# Patient Record
Sex: Female | Born: 1980 | Race: Black or African American | Hispanic: No | Marital: Married | Smoking: Never smoker
Health system: Southern US, Community
[De-identification: ages and names within clinical notes are randomized; demographics above are authoritative.]

## PROBLEM LIST (undated history)

## (undated) ENCOUNTER — Inpatient Hospital Stay (HOSPITAL_COMMUNITY): Payer: Self-pay

## (undated) DIAGNOSIS — Z8619 Personal history of other infectious and parasitic diseases: Secondary | ICD-10-CM

## (undated) DIAGNOSIS — G4730 Sleep apnea, unspecified: Secondary | ICD-10-CM

## (undated) DIAGNOSIS — O009 Unspecified ectopic pregnancy without intrauterine pregnancy: Secondary | ICD-10-CM

## (undated) DIAGNOSIS — D649 Anemia, unspecified: Secondary | ICD-10-CM

## (undated) DIAGNOSIS — I10 Essential (primary) hypertension: Secondary | ICD-10-CM

## (undated) DIAGNOSIS — I5021 Acute systolic (congestive) heart failure: Secondary | ICD-10-CM

## (undated) HISTORY — DX: Anemia, unspecified: D64.9

## (undated) HISTORY — DX: Acute systolic (congestive) heart failure: I50.21

## (undated) HISTORY — PX: OTHER SURGICAL HISTORY: SHX169

## (undated) HISTORY — DX: Personal history of other infectious and parasitic diseases: Z86.19

---

## 2012-11-19 ENCOUNTER — Encounter (HOSPITAL_COMMUNITY): Payer: Self-pay | Admitting: Emergency Medicine

## 2012-11-19 DIAGNOSIS — R059 Cough, unspecified: Secondary | ICD-10-CM | POA: Insufficient documentation

## 2012-11-19 DIAGNOSIS — R109 Unspecified abdominal pain: Secondary | ICD-10-CM | POA: Insufficient documentation

## 2012-11-19 DIAGNOSIS — O200 Threatened abortion: Secondary | ICD-10-CM | POA: Insufficient documentation

## 2012-11-19 DIAGNOSIS — O209 Hemorrhage in early pregnancy, unspecified: Secondary | ICD-10-CM | POA: Insufficient documentation

## 2012-11-19 DIAGNOSIS — R05 Cough: Secondary | ICD-10-CM | POA: Insufficient documentation

## 2012-11-19 DIAGNOSIS — O99891 Other specified diseases and conditions complicating pregnancy: Secondary | ICD-10-CM | POA: Insufficient documentation

## 2012-11-19 DIAGNOSIS — J029 Acute pharyngitis, unspecified: Secondary | ICD-10-CM | POA: Insufficient documentation

## 2012-11-19 DIAGNOSIS — J3489 Other specified disorders of nose and nasal sinuses: Secondary | ICD-10-CM | POA: Insufficient documentation

## 2012-11-19 LAB — CBC WITH DIFFERENTIAL/PLATELET
Basophils Absolute: 0 10*3/uL (ref 0.0–0.1)
Eosinophils Absolute: 0.6 10*3/uL (ref 0.0–0.7)
Eosinophils Relative: 8 % — ABNORMAL HIGH (ref 0–5)
Lymphocytes Relative: 42 % (ref 12–46)
MCV: 85.4 fL (ref 78.0–100.0)
Neutrophils Relative %: 39 % — ABNORMAL LOW (ref 43–77)
Platelets: 275 10*3/uL (ref 150–400)
RBC: 4.1 MIL/uL (ref 3.87–5.11)
RDW: 15 % (ref 11.5–15.5)
WBC: 7.5 10*3/uL (ref 4.0–10.5)

## 2012-11-19 LAB — URINALYSIS, ROUTINE W REFLEX MICROSCOPIC
Bilirubin Urine: NEGATIVE
Glucose, UA: NEGATIVE mg/dL
Ketones, ur: NEGATIVE mg/dL
Nitrite: NEGATIVE
Specific Gravity, Urine: 1.023 (ref 1.005–1.030)
pH: 6 (ref 5.0–8.0)

## 2012-11-19 LAB — COMPREHENSIVE METABOLIC PANEL
Albumin: 3.8 g/dL (ref 3.5–5.2)
Alkaline Phosphatase: 64 U/L (ref 39–117)
BUN: 13 mg/dL (ref 6–23)
CO2: 26 mEq/L (ref 19–32)
Chloride: 101 mEq/L (ref 96–112)
Creatinine, Ser: 0.6 mg/dL (ref 0.50–1.10)
GFR calc non Af Amer: >90 mL/min (ref >90)
Glucose, Bld: 93 mg/dL (ref 70–99)
Potassium: 3.6 mEq/L (ref 3.5–5.1)
Total Bilirubin: 0.2 mg/dL — ABNORMAL LOW (ref 0.3–1.2)

## 2012-11-19 LAB — POCT PREGNANCY, URINE: Preg Test, Ur: POSITIVE — AB

## 2012-11-19 LAB — URINE MICROSCOPIC-ADD ON

## 2012-11-19 NOTE — ED Notes (Signed)
UNABLE TO GIVE URINE SPECIMEN AT TRIAGE " I JUST WENT".

## 2012-11-19 NOTE — ED Notes (Signed)
PT. REPORTS VAGINAL SPOTTING ONSET TODAY , STATES POSITIVE HOME PREGNANCY TEST 2 DAYS AGO WITH MID ABDOMINAL CRAMPING . ALSO REPORTED HEADACHE AND SORE THROAT.

## 2012-11-20 ENCOUNTER — Emergency Department (HOSPITAL_COMMUNITY)
Admission: EM | Admit: 2012-11-20 | Discharge: 2012-11-20 | Disposition: A | Discharge status: Home / Self Care | Payer: BC Managed Care – PPO | Attending: Emergency Medicine | Admitting: Emergency Medicine

## 2012-11-20 ENCOUNTER — Emergency Department (HOSPITAL_COMMUNITY): Payer: BC Managed Care – PPO

## 2012-11-20 DIAGNOSIS — O209 Hemorrhage in early pregnancy, unspecified: Secondary | ICD-10-CM

## 2012-11-20 DIAGNOSIS — O200 Threatened abortion: Secondary | ICD-10-CM

## 2012-11-20 LAB — WET PREP, GENITAL
Trich, Wet Prep: NONE SEEN
WBC, Wet Prep HPF POC: NONE SEEN

## 2012-11-20 LAB — GC/CHLAMYDIA PROBE AMP
CT Probe RNA: NEGATIVE
GC Probe RNA: NEGATIVE

## 2012-11-20 NOTE — ED Notes (Signed)
Pt presented to ED with  Vaginal bleeding.As per pt her last LMP  Was 16 of December 2013.Pt also complains of running nose and generally unwell.

## 2012-11-20 NOTE — ED Notes (Signed)
Pt discharged.Vital signs stable and GCS 15 

## 2012-11-20 NOTE — ED Provider Notes (Signed)
History     CSN: [id number]  Arrival date & time 11/19/12  2119   First MD Initiated Contact with Patient 11/20/12 0045      Chief Complaint  Patient presents with  . Vaginal Bleeding    (Consider location/radiation/quality/duration/timing/severity/associated sxs/prior treatment) HPI 32 yo female presents to the ER with complaint of 2-3 days of URI sxs with congestion, cough, sore throat.  She has been taking goody powers and dayquil.  No fevers, no chest pain.  Pt also c/o positive pregnancy test 2 days ago. LMP shows gestation of 4 weeks 1 day, approx.  Today has had lower abd cramping and spotting.  Pt is G0P1.  She does not know her blood type. Pt has f/u with ob in 2 weeks.   History reviewed. No pertinent past medical history.  Past Surgical History  Procedure Date  . Arm surgery     No family history on file.  History  Substance Use Topics  . Smoking status: Never Smoker   . Smokeless tobacco: Not on file  . Alcohol Use: Yes    OB History    Grav Para Term Preterm Abortions TAB SAB Ect Mult Living                  Review of Systems  All other systems reviewed and are negative.    Allergies  Shellfish allergy  Home Medications   Current Outpatient Rx  Name  Route  Sig  Dispense  Refill  . GOODY HEADACHE PO   Oral   Take 1 packet by mouth daily as needed. For headache         . DAYQUIL PO   Oral   Take 30 mLs by mouth once.           BP 160/90  Pulse 80  Temp 99.1 F (37.3 C) (Oral)  Resp 18  SpO2 100%  LMP 10/24/2012  Physical Exam  Nursing note and vitals reviewed. Constitutional: She is oriented to person, place, and time. She appears well-developed and well-nourished.  HENT:  Head: Normocephalic and atraumatic.  Right Ear: External ear normal.  Left Ear: External ear normal.  Mouth/Throat: Oropharynx is clear and moist.       Rhinorrhea noted  Eyes: Conjunctivae normal and EOM are normal. Pupils are equal, round, and reactive  to light.  Neck: Normal range of motion. Neck supple. No JVD present. No tracheal deviation present. No thyromegaly present.  Cardiovascular: Normal rate, regular rhythm, normal heart sounds and intact distal pulses.  Exam reveals no gallop and no friction rub.   No murmur heard. Pulmonary/Chest: Effort normal and breath sounds normal. No stridor. No respiratory distress. She has no wheezes. She has no rales. She exhibits no tenderness.  Abdominal: Soft. Bowel sounds are normal. She exhibits no distension and no mass. There is no tenderness. There is no rebound and no guarding.  Genitourinary:       External genitalia normal Vagina with blood  Cervix slightly open with mucus and tissue seen at os no lesions No cervical motion tenderness Adnexa palpated, no masses or tenderness noted Bladder palpated no tenderness Uterus palpated no masses or tenderness    Musculoskeletal: Normal range of motion. She exhibits no edema and no tenderness.  Lymphadenopathy:    She has no cervical adenopathy.  Neurological: She is alert and oriented to person, place, and time. She exhibits normal muscle tone. Coordination normal.  Skin: Skin is dry. No rash noted. No erythema. No  pallor.  Psychiatric: She has a normal mood and affect. Her behavior is normal. Judgment and thought content normal.    ED Course  Procedures (including critical care time)  Labs Reviewed  URINALYSIS, ROUTINE W REFLEX MICROSCOPIC - Abnormal; Notable for the following:    Hgb urine dipstick LARGE (*)     All other components within normal limits  COMPREHENSIVE METABOLIC PANEL - Abnormal; Notable for the following:    Total Bilirubin 0.2 (*)     All other components within normal limits  CBC WITH DIFFERENTIAL - Abnormal; Notable for the following:    Hemoglobin 11.7 (*)     HCT 35.0 (*)     Neutrophils Relative 39 (*)     Eosinophils Relative 8 (*)     All other components within normal limits  POCT PREGNANCY, URINE -  Abnormal; Notable for the following:    Preg Test, Ur POSITIVE (*)     All other components within normal limits  URINE MICROSCOPIC-ADD ON - Abnormal; Notable for the following:    Squamous Epithelial / LPF FEW (*)     All other components within normal limits  HCG, QUANTITATIVE, PREGNANCY - Abnormal; Notable for the following:    hCG, Beta Chain, Quant, S 1355 (*)     All other components within normal limits  WET PREP, GENITAL - Abnormal; Notable for the following:    Clue Cells Wet Prep HPF POC FEW (*)     All other components within normal limits  ABO/RH  GC/CHLAMYDIA PROBE AMP   US Ob Comp Less 14 Wks  11/20/2012  *RADIOLOGY REPORT*  Clinical Data: Pelvic pain and vaginal spotting.  OBSTETRIC <14 WK [place] AND TRANSVAGINAL OB US  Technique:  Both transabdominal and transvaginal ultrasound examinations were performed for complete evaluation of the gestation as well as the maternal uterus, adnexal regions, and pelvic cul-de-sac.  Transvaginal technique was performed to assess early pregnancy.  Comparison:  None.  Intrauterine gestational sac:  None seen. Yolk sac: N/A Embryo: N/A  Maternal uterus/adnexae: No subchorionic hemorrhage is seen.  The uterus measures 8.5 x 4.7 x 5.9 cm.  The endometrial echo complex is diffusely thickened, measuring up to 2.3 cm.  The ovaries are unremarkable in appearance.  The right ovary measures 4.3 x 1.8 x 2.5 cm, while the left ovary measures 3.2 x 3.1 x 2.1 cm.  No suspicious adnexal masses are seen; there is no evidence of ovarian torsion.  No definite ectopic pregnancy is identified.  Trace free fluid is noted within the pelvic cul-de-sac.  IMPRESSION: No intrauterine gestational sac seen at this time.  No definite evidence for ectopic pregnancy.  It likely remains too early to visualize an intrauterine gestational sac.  Would follow up quantitative beta HCG level, and perform follow-up pelvic ultrasound in 1-2 weeks.   Original Report Authenticated By: Tonia Ghent, M.D.    US Ob Transvaginal  11/20/2012  *RADIOLOGY REPORT*  Clinical Data: Pelvic pain and vaginal spotting.  OBSTETRIC <14 WK [place] AND TRANSVAGINAL OB US  Technique:  Both transabdominal and transvaginal ultrasound examinations were performed for complete evaluation of the gestation as well as the maternal uterus, adnexal regions, and pelvic cul-de-sac.  Transvaginal technique was performed to assess early pregnancy.  Comparison:  None.  Intrauterine gestational sac:  None seen. Yolk sac: N/A Embryo: N/A  Maternal uterus/adnexae: No subchorionic hemorrhage is seen.  The uterus measures 8.5 x 4.7 x 5.9 cm.  The endometrial echo complex is diffusely thickened,  measuring up to 2.3 cm.  The ovaries are unremarkable in appearance.  The right ovary measures 4.3 x 1.8 x 2.5 cm, while the left ovary measures 3.2 x 3.1 x 2.1 cm.  No suspicious adnexal masses are seen; there is no evidence of ovarian torsion.  No definite ectopic pregnancy is identified.  Trace free fluid is noted within the pelvic cul-de-sac.  IMPRESSION: No intrauterine gestational sac seen at this time.  No definite evidence for ectopic pregnancy.  It likely remains too early to visualize an intrauterine gestational sac.  Would follow up quantitative beta HCG level, and perform follow-up pelvic ultrasound in 1-2 weeks.   Original Report Authenticated By: Tonia Ghent, M.D.      1. Threatened abortion   2. Bleeding in early pregnancy       MDM  32 year old female with vaginal bleeding, recent positive pregnancy test. She is a G1 P0. Her blood type is O+. Patient instructed to followup in 2 days for repeat Quant at [facility] hospital, [facility]U. I counseled her that this is most likely going to go from threatened to complete AB as she has slightly opened os, and tissue seen at the os. She was given precautions for return        Olivia Mackie, MD 11/20/12 2102

## 2012-11-21 ENCOUNTER — Encounter (HOSPITAL_COMMUNITY): Payer: Self-pay | Admitting: *Deleted

## 2012-11-21 ENCOUNTER — Inpatient Hospital Stay (HOSPITAL_COMMUNITY)
Admission: AD | Admit: 2012-11-21 | Discharge: 2012-11-21 | Disposition: A | Discharge status: Home / Self Care | Payer: BC Managed Care – PPO | Source: Ambulatory Visit | Attending: Obstetrics and Gynecology | Admitting: Obstetrics and Gynecology

## 2012-11-21 DIAGNOSIS — O209 Hemorrhage in early pregnancy, unspecified: Secondary | ICD-10-CM

## 2012-11-21 DIAGNOSIS — O26859 Spotting complicating pregnancy, unspecified trimester: Secondary | ICD-10-CM | POA: Insufficient documentation

## 2012-11-21 DIAGNOSIS — R109 Unspecified abdominal pain: Secondary | ICD-10-CM | POA: Insufficient documentation

## 2012-11-21 LAB — URINALYSIS, ROUTINE W REFLEX MICROSCOPIC
Bilirubin Urine: NEGATIVE
Ketones, ur: NEGATIVE mg/dL
Nitrite: NEGATIVE
Protein, ur: NEGATIVE mg/dL
Urobilinogen, UA: 1 mg/dL (ref 0.0–1.0)

## 2012-11-21 LAB — HCG, QUANTITATIVE, PREGNANCY: hCG, Beta Chain, Quant, S: 1895 m[IU]/mL — ABNORMAL HIGH (ref <5)

## 2012-11-21 NOTE — MAU Provider Note (Signed)
Attestation of Attending Supervision of Advanced Practitioner (CNM/NP): Evaluation and management procedures were performed by the Advanced Practitioner under my supervision and collaboration.  I have reviewed the Advanced Practitioner's note and chart, and I agree with the management and plan.  Alice Hays 11/21/2012 9:24 PM

## 2012-11-21 NOTE — MAU Provider Note (Signed)
History     CSN: [id number]  Arrival date and time: 11/21/12 6295   First Provider Initiated Contact with Patient 11/21/12 0913      Chief Complaint  Patient presents with  . Vaginal Bleeding   HPI  Pt is [redacted]w[redacted]d pregnant and presents with abdominal cramping and spotting last night.  She has also has had constipation.  Her pain in her abdomen is mid lower abdomen radiating to umbilicus.  She denies nausea or vomiting, UTI symptoms.  She was seen in [facility] ED 1/23 evening and had HCG drawn and ultrasound.   Past Medical History  Diagnosis Date  . No pertinent past medical history     Past Surgical History  Procedure Date  . Arm surgery     No family history on file.  History  Substance Use Topics  . Smoking status: Never Smoker   . Smokeless tobacco: Not on file  . Alcohol Use: Yes    Allergies:  Allergies  Allergen Reactions  . Shellfish Allergy Anaphylaxis    Prescriptions prior to admission  Medication Sig Dispense Refill  . acetaminophen (TYLENOL) 325 MG tablet Take 650 mg by mouth every 4 (four) hours as needed. For pain      . Aspirin-Acetaminophen-Caffeine (GOODY HEADACHE PO) Take 1 packet by mouth daily as needed. For headache      . Pseudoephedrine-APAP-DM (DAYQUIL PO) Take 30 mLs by mouth daily as needed. For cold symptoms        Review of Systems  Constitutional: Negative for fever and chills.  Gastrointestinal: Positive for abdominal pain and constipation. Negative for nausea, vomiting and diarrhea.  Genitourinary: Negative for dysuria and urgency.   Physical Exam   Blood pressure 134/78, pulse 90, temperature 99.7 F (37.6 C), temperature source Oral, resp. rate 18, last menstrual period 10/26/2012.  Physical Exam  Vitals reviewed. Constitutional: She is oriented to person, place, and time. She appears well-developed and well-nourished.  HENT:  Head: Normocephalic.  Eyes: Pupils are equal, round, and reactive to light.  Neck: Normal range of  motion. Neck supple.  Cardiovascular: Normal rate.   Respiratory: Effort normal.  GI: Soft. She exhibits no distension. There is no tenderness. There is no rebound and no guarding.  Genitourinary:       Small amount of dark brown thick vaginal discharge in vault; cervix clean, NT; uterus NSSC NT; adnexa without palpable enlargement or tenderness  Musculoskeletal: Normal range of motion.  Neurological: She is alert and oriented to person, place, and time.  Skin: Skin is warm and dry.  Psychiatric: She has a normal mood and affect.    MAU Course  Procedures Results for orders placed during the hospital encounter of 11/21/12 (from the past 24 hour(s))  URINALYSIS, ROUTINE W REFLEX MICROSCOPIC     Status: Abnormal   Collection Time   11/21/12  8:50 AM      Component Value Range   Color, Urine YELLOW  YELLOW   APPearance CLEAR  CLEAR   Specific Gravity, Urine >1.030 (*) 1.005 - 1.030   pH 6.0  5.0 - 8.0   Glucose, UA NEGATIVE  NEGATIVE mg/dL   Hgb urine dipstick LARGE (*) NEGATIVE   Bilirubin Urine NEGATIVE  NEGATIVE   Ketones, ur NEGATIVE  NEGATIVE mg/dL   Protein, ur NEGATIVE  NEGATIVE mg/dL   Urobilinogen, UA 1.0  0.0 - 1.0 mg/dL   Nitrite NEGATIVE  NEGATIVE   Leukocytes, UA NEGATIVE  NEGATIVE  URINE MICROSCOPIC-ADD ON  Status: Abnormal   Collection Time   11/21/12  8:50 AM      Component Value Range   Squamous Epithelial / LPF MANY (*) RARE   WBC, UA 0-2  <3 WBC/hpf   RBC / HPF 7-10  <3 RBC/hpf   Urine-Other MUCOUS PRESENT    HCG, QUANTITATIVE, PREGNANCY     Status: Abnormal   Collection Time   11/21/12 10:14 AM      Component Value Range   hCG, Beta Chain, Quant, S 1895 (*) <5 mIU/mL  discussed with Dr. Jolayne Panther- will repeat HCG in 48 hours  Assessment and Plan  Bleeding in pregnancy Repeat HCG 48 hours- Monday 1/27 Ectopic precautions  Alice Hays 11/21/2012, 9:13 AM

## 2012-11-21 NOTE — MAU Note (Signed)
t seen at [facility] on 1/23-24 for vaginal bleeding. Told to come here for follow up. Told she had a threatened miscarriage. Pt reports she is still having some vaginal bleeding lighter than it was the other day and reports some cramping like a period.

## 2012-11-23 ENCOUNTER — Inpatient Hospital Stay (HOSPITAL_COMMUNITY)
Admission: AD | Admit: 2012-11-23 | Discharge: 2012-11-23 | Disposition: A | Discharge status: Home / Self Care | Payer: BC Managed Care – PPO | Source: Ambulatory Visit | Attending: Obstetrics & Gynecology | Admitting: Obstetrics & Gynecology

## 2012-11-23 ENCOUNTER — Inpatient Hospital Stay (HOSPITAL_COMMUNITY): Payer: BC Managed Care – PPO

## 2012-11-23 ENCOUNTER — Encounter (HOSPITAL_COMMUNITY): Payer: Self-pay | Admitting: *Deleted

## 2012-11-23 DIAGNOSIS — O26859 Spotting complicating pregnancy, unspecified trimester: Secondary | ICD-10-CM

## 2012-11-23 DIAGNOSIS — O00109 Unspecified tubal pregnancy without intrauterine pregnancy: Secondary | ICD-10-CM | POA: Insufficient documentation

## 2012-11-23 DIAGNOSIS — R109 Unspecified abdominal pain: Secondary | ICD-10-CM

## 2012-11-23 DIAGNOSIS — O009 Unspecified ectopic pregnancy without intrauterine pregnancy: Secondary | ICD-10-CM

## 2012-11-23 LAB — COMPREHENSIVE METABOLIC PANEL
ALT: 12 U/L (ref 0–35)
Alkaline Phosphatase: 57 U/L (ref 39–117)
CO2: 25 mEq/L (ref 19–32)
Chloride: 97 mEq/L (ref 96–112)
GFR calc Af Amer: >90 mL/min (ref >90)
Glucose, Bld: 96 mg/dL (ref 70–99)
Potassium: 3.6 mEq/L (ref 3.5–5.1)
Sodium: 134 mEq/L — ABNORMAL LOW (ref 135–145)
Total Bilirubin: 0.1 mg/dL — ABNORMAL LOW (ref 0.3–1.2)
Total Protein: 7.2 g/dL (ref 6.0–8.3)

## 2012-11-23 LAB — HCG, QUANTITATIVE, PREGNANCY: hCG, Beta Chain, Quant, S: 2782 m[IU]/mL — ABNORMAL HIGH (ref <5)

## 2012-11-23 LAB — CBC
Hemoglobin: 10.3 g/dL — ABNORMAL LOW (ref 12.0–15.0)
MCHC: 32.1 g/dL (ref 30.0–36.0)
RBC: 3.7 MIL/uL — ABNORMAL LOW (ref 3.87–5.11)
WBC: 9.3 10*3/uL (ref 4.0–10.5)

## 2012-11-23 MED ORDER — METHOTREXATE INJECTION FOR WOMEN'S HOSPITAL
50.0000 mg/m2 | Freq: Once | INTRAMUSCULAR | Status: AC
  Administered 2012-11-23: 85 mg via INTRAMUSCULAR
  Filled 2012-11-23: qty 1.7

## 2012-11-23 NOTE — MAU Provider Note (Signed)
History     CSN: [id number]  Arrival date and time: 11/23/12 4098   First Provider Initiated Contact with Patient 11/23/12 [phone]      Chief Complaint  Patient presents with  . Follow-up   HPI Ms. Alice Hays is a 32 y.o. G1P0 at [redacted]w[redacted]d who presents to MAU today for a follow-up quantitative bHCG. The patient was seen on 11/21/12 for vaginal bleeding and abdominal pain. The patient states that her bleeding has decreased to spotting today. The is no longer having abdominal pain. She states that she was given recommendations for stool softeners when she was here on 11/21/12 and that she was able to have a BM and had significant relief of abdominal pain. The patient has had mild, occasional nausea without vomiting. She denies fever.   OB History    Grav Para Term Preterm Abortions TAB SAB Ect Mult Living   1               Past Medical History  Diagnosis Date  . No pertinent past medical history     Past Surgical History  Procedure Date  . Arm surgery     Family History  Problem Relation Age of Onset  . Cancer Mother   . Heart disease Mother   . Heart disease Father   . Cancer Maternal Aunt   . Diabetes Maternal Grandmother     History  Substance Use Topics  . Smoking status: Never Smoker   . Smokeless tobacco: Not on file  . Alcohol Use: Yes    Allergies:  Allergies  Allergen Reactions  . Shellfish Allergy Anaphylaxis    No prescriptions prior to admission    ROS All negative unless otherwise noted in HPI Physical Exam   Blood pressure 121/79, pulse 90, temperature 98 F (36.7 C), temperature source Oral, resp. rate 16, height 4\' 10"  (1.473 m), weight 161 lb (73.029 kg), last menstrual period 10/26/2012, SpO2 99.00%.  Physical Exam  Constitutional: She is oriented to person, place, and time. She appears well-developed and well-nourished. No distress.  HENT:  Head: Normocephalic.  Cardiovascular: Normal rate, regular rhythm and normal heart sounds.     Respiratory: Effort normal and breath sounds normal. No respiratory distress.  GI: Soft. Bowel sounds are normal. She exhibits no distension and no mass. There is no tenderness. There is no rebound and no guarding.  Neurological: She is alert and oriented to person, place, and time.  Skin: Skin is warm and dry. No erythema.  Psychiatric: She has a normal mood and affect.   Results for orders placed during the hospital encounter of 11/23/12 (from the past 24 hour(s))  HCG, QUANTITATIVE, PREGNANCY     Status: Abnormal   Collection Time   11/23/12  8:20 AM      Component Value Range   hCG, Beta Chain, Quant, S 2782 (*) <5 mIU/mL  CBC     Status: Abnormal   Collection Time   11/23/12  8:20 AM      Component Value Range   WBC 9.3  4.0 - 10.5 K/uL   RBC 3.70 (*) 3.87 - 5.11 MIL/uL   Hemoglobin 10.3 (*) 12.0 - 15.0 g/dL   HCT 47.8 (*) 29.5 - 62.1 %   MCV 86.8  78.0 - 100.0 fL   MCH 27.8  26.0 - 34.0 pg   MCHC 32.1  30.0 - 36.0 g/dL   RDW 30.8  65.7 - 84.6 %   Platelets 247  150 - 400 K/uL  COMPREHENSIVE METABOLIC PANEL     Status: Abnormal   Collection Time   11/23/12  8:20 AM      Component Value Range   Sodium 134 (*) 135 - 145 mEq/L   Potassium 3.6  3.5 - 5.1 mEq/L   Chloride 97  96 - 112 mEq/L   CO2 25  19 - 32 mEq/L   Glucose, Bld 96  70 - 99 mg/dL   BUN 15  6 - 23 mg/dL   Creatinine, Ser 1.61  0.50 - 1.10 mg/dL   Calcium 9.5  8.4 - 09.6 mg/dL   Total Protein 7.2  6.0 - 8.3 g/dL   Albumin 3.5  3.5 - 5.2 g/dL   AST 19  0 - 37 U/L   ALT 12  0 - 35 U/L   Alkaline Phosphatase 57  39 - 117 U/L   Total Bilirubin 0.1 (*) 0.3 - 1.2 mg/dL   GFR calc non Af Amer >90  >90 mL/min   GFR calc Af Amer >90  >90 mL/min      MAU Course  Procedures None  MDM Discussed patient with Dr. Penne Lash. Last US showed no IUGS or evidence of ectopic. Quant went from 1895 to 2782 in 48 hours. She recommends [place] today.  US shows Left Ectopic pregnancy. Discussed [place] results with Dr. Penne Lash. Patient  is a good candidate for MTX.  Discussed dx of ectopic and MTX with patient and significant other. Patient agrees to MTX treatment. Information sheet with precautions given and discussed.   Assessment and Plan  A: Ectopic pregnancy  P: Discharge home Patient will follow-up on day 4 (thursday) and 7 (sunday) for quant hCG Ectopic precautions discussed. Patient encouraged to return to MAU sooner if she develops severe abdominal pain, heavy bleeding, fever, N/V Patient will follow-up in clinic in 3-4 weeks  Freddi Starr, PA-C 11/23/2012, 5:45 PM

## 2012-11-23 NOTE — MAU Provider Note (Signed)
Attestation of Attending Supervision of Advanced Practitioner (CNM/NP): Evaluation and management procedures were performed by the Advanced Practitioner under my supervision and collaboration. I have reviewed the Advanced Practitioner's note and chart, and I agree with the management and plan.  Caryl Fate H. 10:54 PM   

## 2012-11-23 NOTE — MAU Note (Signed)
Patient to MAU for repeat BHCG. Patient denies any pain but does have a little spotting on and off.

## 2012-11-23 NOTE — MAU Note (Signed)
Pt presents to MAU for a f/u on pregnancy.

## 2012-11-26 ENCOUNTER — Inpatient Hospital Stay (HOSPITAL_COMMUNITY)
Admission: AD | Admit: 2012-11-26 | Discharge: 2012-11-26 | Disposition: A | Discharge status: Home / Self Care | Payer: BC Managed Care – PPO | Source: Ambulatory Visit | Attending: Obstetrics & Gynecology | Admitting: Obstetrics & Gynecology

## 2012-11-26 DIAGNOSIS — O00109 Unspecified tubal pregnancy without intrauterine pregnancy: Secondary | ICD-10-CM

## 2012-11-26 DIAGNOSIS — O009 Unspecified ectopic pregnancy without intrauterine pregnancy: Secondary | ICD-10-CM

## 2012-11-26 LAB — HCG, QUANTITATIVE, PREGNANCY: hCG, Beta Chain, Quant, S: 6105 m[IU]/mL — ABNORMAL HIGH (ref <5)

## 2012-11-26 MED ORDER — METHOTREXATE INJECTION FOR WOMEN'S HOSPITAL
50.0000 mg/m2 | Freq: Once | INTRAMUSCULAR | Status: AC
  Administered 2012-11-26: 85 mg via INTRAMUSCULAR
  Filled 2012-11-26: qty 1.7

## 2012-11-26 NOTE — MAU Note (Signed)
Pt states here for repeat bhcg post MTX, having small amount of bleeding. No clots noted. Changing pad q4 hours for sanitary purposes, usually sees patch size of deck of cards on pad.

## 2012-11-26 NOTE — MAU Provider Note (Signed)
History     CSN: [id number]  Arrival date and time: 11/26/12 1118   First Provider Initiated Contact with Patient 11/26/12 1242      Chief Complaint  Patient presents with  . Follow-up BCHG post MTX    HPI Ms. Alice Hays is a 32 y.o. G1P0 at [redacted]w[redacted]d who presents to MAU for her follow-up hCG 4 days s/p MTX inj. The patient states that she has had some light vaginal bleeding. She is changing her pad about every 4 hours, but she is not soaking the pads. She is having mild intermittent abdominal pain in the lower quadrants, more on the left than right which she rates at 3/10 at the worst. The patient denies N/V or fevers. She has no other complaints or concerns today.   OB History    Grav Para Term Preterm Abortions TAB SAB Ect Mult Living   1               Past Medical History  Diagnosis Date  . No pertinent past medical history     Past Surgical History  Procedure Date  . Arm surgery     Family History  Problem Relation Age of Onset  . Cancer Mother   . Heart disease Mother   . Heart disease Father   . Cancer Maternal Aunt   . Diabetes Maternal Grandmother     History  Substance Use Topics  . Smoking status: Never Smoker   . Smokeless tobacco: Not on file  . Alcohol Use: Yes    Allergies:  Allergies  Allergen Reactions  . Shellfish Allergy Anaphylaxis    Prescriptions prior to admission  Medication Sig Dispense Refill  . acetaminophen (TYLENOL) 325 MG tablet Take 650 mg by mouth every 4 (four) hours as needed. For pain      . Pseudoephedrine-APAP-DM (DAYQUIL PO) Take 30 mLs by mouth daily as needed. For cold symptoms        ROS All negative unless otherwise noted in HPI Physical Exam   Blood pressure 129/64, pulse 86, temperature 98.2 F (36.8 C), temperature source Oral, resp. rate 18, height 4\' 10"  (1.473 m), weight 161 lb 4 oz (73.143 kg), last menstrual period 10/26/2012, SpO2 100.00%.  Physical Exam  Constitutional: She is oriented to person,  place, and time. She appears well-developed and well-nourished. No distress.  HENT:  Head: Normocephalic.  Cardiovascular: Normal rate.   Respiratory: Effort normal.  GI: Soft. She exhibits no distension and no mass. There is tenderness (mild lower abdominal tenderness midline to LLQ with palpation). There is no rebound and no guarding.  Neurological: She is alert and oriented to person, place, and time.  Skin: Skin is warm and dry. No erythema.  Psychiatric: She has a normal mood and affect.   Results for orders placed during the hospital encounter of 11/26/12 (from the past 24 hour(s))  HCG, QUANTITATIVE, PREGNANCY     Status: Abnormal   Collection Time   11/26/12 11:21 AM      Component Value Range   hCG, Beta Chain, Quant, S 6105 (*) <5 mIU/mL    MAU Course  Procedures None  MDM Discussed patient with Dr. Marice Potter. She recommends second dose of MTX today based on rise in quant hCG noted. Follow-up quant still on Sunday.  1336 - MTX given.   Assessment and Plan  A: Ectopic pregnancy s/p MTX day 4  P: Discharge home Return to MAU for repeat quant hCG on Sunday Discussed ectopic precautions. Patient  is aware to return to MAU sooner if she develops severe abdominal pain, heavy vaginal bleeding, fever, N/V   Freddi Starr, PA-C 11/26/2012, 1:28 PM

## 2012-11-29 ENCOUNTER — Encounter (HOSPITAL_COMMUNITY): Payer: Self-pay | Admitting: Family

## 2012-11-29 ENCOUNTER — Inpatient Hospital Stay (HOSPITAL_COMMUNITY)
Admission: AD | Admit: 2012-11-29 | Discharge: 2012-11-29 | Disposition: A | Discharge status: Home / Self Care | Payer: BC Managed Care – PPO | Source: Ambulatory Visit | Attending: Obstetrics & Gynecology | Admitting: Obstetrics & Gynecology

## 2012-11-29 DIAGNOSIS — O00109 Unspecified tubal pregnancy without intrauterine pregnancy: Secondary | ICD-10-CM | POA: Insufficient documentation

## 2012-11-29 DIAGNOSIS — O009 Unspecified ectopic pregnancy without intrauterine pregnancy: Secondary | ICD-10-CM

## 2012-11-29 LAB — CBC WITH DIFFERENTIAL/PLATELET
Basophils Absolute: 0 10*3/uL (ref 0.0–0.1)
Basophils Relative: 0 % (ref 0–1)
Eosinophils Relative: 12 % — ABNORMAL HIGH (ref 0–5)
Hemoglobin: 10.6 g/dL — ABNORMAL LOW (ref 12.0–15.0)
MCH: 28.6 pg (ref 26.0–34.0)
MCHC: 33.7 g/dL (ref 30.0–36.0)
Monocytes Absolute: 0.5 10*3/uL (ref 0.1–1.0)
Platelets: 242 10*3/uL (ref 150–400)
RBC: 3.7 MIL/uL — ABNORMAL LOW (ref 3.87–5.11)
RDW: 15 % (ref 11.5–15.5)

## 2012-11-29 LAB — BUN: BUN: 10 mg/dL (ref 6–23)

## 2012-11-29 MED ORDER — METHOTREXATE INJECTION FOR WOMEN'S HOSPITAL
50.0000 mg/m2 | Freq: Once | INTRAMUSCULAR | Status: AC
  Administered 2012-11-29: 90 mg via INTRAMUSCULAR
  Filled 2012-11-29: qty 1.8

## 2012-11-29 NOTE — MAU Note (Signed)
Ms. Sobh is here for a follow up beta Hcg. He denies increased pain or bleeding. "Everything is about the same".

## 2012-11-29 NOTE — MAU Provider Note (Signed)
32 y.o. G1P0 at [redacted]w[redacted]d here for repeat quant on day 7 s/p MTX for left ectopic pregnancy. Day 1 quant was 2782, day 4 quant increased to 6105. Today reports continued very light bleeding, no pain.   Physical Exam  Nursing note and vitals reviewed. Constitutional: She is oriented to person, place, and time. She appears well-developed and well-nourished. No distress.  Cardiovascular: Normal rate.   Pulmonary/Chest: Effort normal.  Musculoskeletal: Normal range of motion.  Neurological: She is alert and oriented to person, place, and time.  Skin: Skin is warm and dry.  Psychiatric: She has a normal mood and affect.  ' Results for orders placed during the hospital encounter of 11/29/12 (from the past 24 hour(s))  HCG, QUANTITATIVE, PREGNANCY     Status: Abnormal   Collection Time   11/29/12  5:29 PM      Component Value Range   hCG, Beta Chain, Quant, S 5499 (*) <5 mIU/mL   Meds ordered this encounter  Medications  . methotrexate chemo injection 90 mg    Sig:    A/P:  1. Ectopic pregnancy   2nd dose of MTX given today d/t less than 15% decrease in quant HCG. Follow up per protocol    Medication List     As of 11/29/2012  6:41 PM    ASK your doctor about these medications         acetaminophen 325 MG tablet   Commonly known as: TYLENOL

## 2012-12-02 ENCOUNTER — Inpatient Hospital Stay (HOSPITAL_COMMUNITY): Payer: BC Managed Care – PPO

## 2012-12-02 ENCOUNTER — Encounter (HOSPITAL_COMMUNITY): Payer: Self-pay

## 2012-12-02 ENCOUNTER — Inpatient Hospital Stay (HOSPITAL_COMMUNITY)
Admission: AD | Admit: 2012-12-02 | Discharge: 2012-12-02 | Disposition: A | Discharge status: Home / Self Care | Payer: BC Managed Care – PPO | Source: Ambulatory Visit | Attending: Obstetrics and Gynecology | Admitting: Obstetrics and Gynecology

## 2012-12-02 DIAGNOSIS — O00109 Unspecified tubal pregnancy without intrauterine pregnancy: Secondary | ICD-10-CM

## 2012-12-02 DIAGNOSIS — Z09 Encounter for follow-up examination after completed treatment for conditions other than malignant neoplasm: Secondary | ICD-10-CM

## 2012-12-02 HISTORY — DX: Unspecified ectopic pregnancy without intrauterine pregnancy: O00.90

## 2012-12-02 MED ORDER — HYDROCODONE-ACETAMINOPHEN 5-325 MG PO TABS
1.0000 | ORAL_TABLET | Freq: Once | ORAL | Status: AC
  Administered 2012-12-02: 1 via ORAL
  Filled 2012-12-02: qty 1

## 2012-12-02 NOTE — MAU Note (Signed)
Pt states 3rd MTX dose was Sunday, has had increased bleeding and pain, pain is suprapubic, denies uti s/s. 1 clot lime-sized noted today. Pt concerned.

## 2012-12-02 NOTE — MAU Provider Note (Signed)
Attestation of Attending Supervision of Advanced Practitioner (CNM/NP): Evaluation and management procedures were performed by the Advanced Practitioner under my supervision and collaboration. I have reviewed the Advanced Practitioner's note and chart, and I agree with the management and plan.  LEGGETT,KELLY H. 9:44 PM

## 2012-12-02 NOTE — Progress Notes (Signed)
Pt states friend is waiting outside for her

## 2012-12-02 NOTE — MAU Provider Note (Signed)
Alice Hays is a 32 y.o. female who presents to MAU for follow up Bhcg s/p MTX She received MTX 11/23/12 for ectopic pregnancy and at that visit her Bhcg was 2782, she returned 11/26/12 and it increased to 6105, then on 11/29/12 dropped to 5499. Tonight the Bhcg has dropped to 3655. Patient reports increase in pain over the last 2 days. Cramping.   Results for orders placed during the hospital encounter of 12/02/12 (from the past 24 hour(s))  HCG, QUANTITATIVE, PREGNANCY     Status: Abnormal   Collection Time   12/02/12  6:45 PM      Component Value Range   hCG, Beta Chain, Quant, S 3655 (*) <5 mIU/mL     Since patient is having pain ultrasound repeated. and shows no change from previous scan    I discussed results with the patient and follow up in one week or repeat MTX tonight.  Patient will return in one week but will follow strict ectopic precautions and return immediately for problems.  Assessment: 32 y.o. female here for follow up of ectopic pregnancy   Bhcg dropping   Ultrasound unchanged  Plan:  Discussed with Dr. Emelda Fear   Hydrocodone 5/325 mg PO now   Return in one week for follow up    Strict ectopic precautions, return immediately for problems.

## 2012-12-02 NOTE — Discharge Instructions (Signed)
Your pregnancy hormone level has dropped tonight to 3,655. Return in one week for follow up. Remember, NO SEX, NO TAMPONS, Nothing in the vagina until follow up. Return immediately for any problems.

## 2012-12-02 NOTE — MAU Note (Signed)
Pt had an ectopic on Jan 23rd-is here for follow up lab work-is requesting to be seen due to vaginal bleeding and cramping

## 2012-12-11 ENCOUNTER — Inpatient Hospital Stay (HOSPITAL_COMMUNITY)
Admission: AD | Admit: 2012-12-11 | Discharge: 2012-12-11 | Disposition: A | Discharge status: Home / Self Care | Payer: BC Managed Care – PPO | Source: Ambulatory Visit | Attending: Obstetrics & Gynecology | Admitting: Obstetrics & Gynecology

## 2012-12-11 DIAGNOSIS — O00109 Unspecified tubal pregnancy without intrauterine pregnancy: Secondary | ICD-10-CM | POA: Insufficient documentation

## 2012-12-11 DIAGNOSIS — O009 Unspecified ectopic pregnancy without intrauterine pregnancy: Secondary | ICD-10-CM

## 2012-12-11 LAB — HCG, QUANTITATIVE, PREGNANCY: hCG, Beta Chain, Quant, S: 1678 m[IU]/mL — ABNORMAL HIGH (ref <5)

## 2012-12-11 NOTE — MAU Provider Note (Signed)
Attestation of Attending Supervision of Advanced Practitioner (PA/CNM/NP): Evaluation and management procedures were performed by the Advanced Practitioner under my supervision and collaboration.  I have reviewed the Advanced Practitioner's note and chart, and I agree with the management and plan.  Makaylia Hewett, MD, FACOG Attending Obstetrician & Gynecologist Faculty Practice, [facility]  

## 2012-12-11 NOTE — MAU Note (Signed)
Pt here for F/U BHCG.  Pt states she has "lump" in her LLQ that she can feel, hurts some if she presses on it.  Pt has spotting.

## 2012-12-11 NOTE — MAU Provider Note (Signed)
  History     CSN: [id number]  Arrival date and time: 12/11/12 1257   None     Chief Complaint  Patient presents with  . Follow-up   HPI Alice Hays is 32 y.o. [id number] presents for weekly BHCG --follow up for left ectopic pregnancy dx on 1/27 and treated with Methotrexate.  On that date BHCG was 2782; 1/12-6103; 2/11-5398; and 2/7 3655.  She reports a "Lump" in her left side that in painful  Today.    Past Medical History  Diagnosis Date  . Ectopic pregnancy     L adnexa    Past Surgical History  Procedure Laterality Date  . Arm surgery      Family History  Problem Relation Age of Onset  . Cancer Mother   . Heart disease Mother   . Heart disease Father   . Cancer Maternal Aunt   . Diabetes Maternal Grandmother     History  Substance Use Topics  . Smoking status: Never Smoker   . Smokeless tobacco: Never Used  . Alcohol Use: Yes    Allergies:  Allergies  Allergen Reactions  . Shellfish Allergy Anaphylaxis    Prescriptions prior to admission  Medication Sig Dispense Refill  . acetaminophen (TYLENOL) 325 MG tablet Take 650 mg by mouth every 4 (four) hours as needed. For pain        Review of Systems  Constitutional: Negative.   HENT: Negative.   Respiratory: Negative.   Cardiovascular: Negative.   Gastrointestinal: Positive for abdominal pain (left sided "knot" that is painful).   Physical Exam   Blood pressure 107/61, pulse 89, temperature 98 F (36.7 C), temperature source Oral, resp. rate 18, last menstrual period 10/26/2012, unknown if currently breastfeeding.  Physical Exam  Constitutional: She is oriented to person, place, and time. She appears well-developed and well-nourished. No distress.  HENT:  Head: Normocephalic.  Neck: Normal range of motion.  GI: Soft. There is tenderness (mild left sided tenderness without palpable mass).  Neurological: She is alert and oriented to person, place, and time.  Skin: Skin is warm and dry.   Psychiatric: She has a normal mood and affect. Her behavior is normal.     Results for orders placed during the hospital encounter of 12/11/12 (from the past 24 hour(s))  HCG, QUANTITATIVE, PREGNANCY     Status: Abnormal   Collection Time    12/11/12  1:15 PM      Result Value Range   hCG, Beta Chain, Quant, S 1678 (*) <5 mIU/mL   MAU Course  Procedures  MDM BHCGs falling by >50%.  Will have her follow weekly BHCG in the CLinic Discussed with the patient the results and the importance of continued follow up until BHCG <5  Assessment and Plan  A:  Left ectopic pregnancy with MTX     BHCGs falling  P:  Follow up in the clinic in 1 week-referral made     To return for worsening sxs.     Rhia Blatchford,EVE M 12/11/2012, 1:24 PM

## 2012-12-19 NOTE — MAU Provider Note (Signed)
Attestation of Attending Supervision of Advanced Practitioner: Evaluation and management procedures were performed by the PA/NP/CNM/OB Fellow under my supervision/collaboration. Chart reviewed and agree with management and plan.  Kayo Zion V 12/19/2012 5:47 AM

## 2013-09-26 ENCOUNTER — Encounter (HOSPITAL_COMMUNITY): Payer: Self-pay | Admitting: *Deleted

## 2014-02-17 ENCOUNTER — Inpatient Hospital Stay (HOSPITAL_COMMUNITY)
Admission: AD | Admit: 2014-02-17 | Discharge: 2014-02-18 | Disposition: A | Discharge status: Home / Self Care | Payer: BC Managed Care – PPO | Source: Ambulatory Visit | Attending: Obstetrics & Gynecology | Admitting: Obstetrics & Gynecology

## 2014-02-17 DIAGNOSIS — N83299 Other ovarian cyst, unspecified side: Secondary | ICD-10-CM

## 2014-02-17 DIAGNOSIS — R1030 Lower abdominal pain, unspecified: Secondary | ICD-10-CM

## 2014-02-17 DIAGNOSIS — O26899 Other specified pregnancy related conditions, unspecified trimester: Secondary | ICD-10-CM

## 2014-02-17 DIAGNOSIS — O9989 Other specified diseases and conditions complicating pregnancy, childbirth and the puerperium: Principal | ICD-10-CM

## 2014-02-17 DIAGNOSIS — O99891 Other specified diseases and conditions complicating pregnancy: Secondary | ICD-10-CM | POA: Insufficient documentation

## 2014-02-17 DIAGNOSIS — R1032 Left lower quadrant pain: Secondary | ICD-10-CM | POA: Insufficient documentation

## 2014-02-17 DIAGNOSIS — O34599 Maternal care for other abnormalities of gravid uterus, unspecified trimester: Secondary | ICD-10-CM | POA: Insufficient documentation

## 2014-02-17 DIAGNOSIS — N83209 Unspecified ovarian cyst, unspecified side: Secondary | ICD-10-CM | POA: Insufficient documentation

## 2014-02-17 LAB — URINALYSIS, ROUTINE W REFLEX MICROSCOPIC
BILIRUBIN URINE: NEGATIVE
Glucose, UA: NEGATIVE mg/dL
Ketones, ur: NEGATIVE mg/dL
Leukocytes, UA: NEGATIVE
Nitrite: NEGATIVE
PROTEIN: NEGATIVE mg/dL
Specific Gravity, Urine: 1.025 (ref 1.005–1.030)
UROBILINOGEN UA: 0.2 mg/dL (ref 0.0–1.0)
pH: 6 (ref 5.0–8.0)

## 2014-02-17 LAB — URINE MICROSCOPIC-ADD ON

## 2014-02-17 LAB — POCT PREGNANCY, URINE: Preg Test, Ur: NEGATIVE

## 2014-02-17 NOTE — MAU Note (Signed)
Pt reports left lower abd pain off/on for a week, constant tonight. Positive home preg test.  LMP 12/23/2013

## 2014-02-17 NOTE — MAU Provider Note (Signed)
Chief Complaint: Abdominal Pain   First Provider Initiated Contact with Patient 02/18/14 0053      SUBJECTIVE HPI: Alice Hays is a 33 y.o. G2P0010 female at Unknown gestational age who presents with pos home UPT and intermittent LLQ pain x 1 week. Last normal menstrual period was 12/23/2013. At the end of March she had 2 days of bleeding unlike her normal periods. By the last normal period she should be 8.1 weeks. By the most recent bleeding episode she should be approximately [redacted] weeks gestation. Unknown date of conception. History of left ectopic pregnancy treated with methotrexate.   Past Medical History  Diagnosis Date  . Ectopic pregnancy     L adnexa   OB History  Gravida Para Term Preterm AB SAB TAB Ectopic Multiple Living  2    1   1       # Outcome Date GA Lbr Len/2nd Weight Sex Delivery Anes PTL Lv  2 CUR           1 ECT              Past Surgical History  Procedure Laterality Date  . Arm surgery     History   Social History  . Marital Status: Single    Spouse Name: N/A    Number of Children: N/A  . Years of Education: N/A   Occupational History  . Not on file.   Social History Main Topics  . Smoking status: Never Smoker   . Smokeless tobacco: Never Used  . Alcohol Use: No  . Drug Use: No  . Sexual Activity: Yes    Birth Control/ Protection: None   Other Topics Concern  . Not on file   Social History Narrative  . No narrative on file   No current facility-administered medications on file prior to encounter.   Current Outpatient Prescriptions on File Prior to Encounter  Medication Sig Dispense Refill  . acetaminophen (TYLENOL) 325 MG tablet Take 650 mg by mouth every 4 (four) hours as needed. For pain       Allergies  Allergen Reactions  . Shellfish Allergy Anaphylaxis    ROS: Pertinent items in HPI. Negative for fever, chills, vaginal bleeding, urinary complaints, GI complaints or vaginal discharge.  OBJECTIVE Blood pressure 115/65, pulse  91, temperature 98.6 F (37 C), temperature source Oral, resp. rate 18, height 4\' 10"  (1.473 m), weight 80.74 kg (178 lb), last menstrual period 12/23/2013, SpO2 100.00%, unknown if currently breastfeeding. GENERAL: Well-developed, well-nourished female in no acute distress.  HEENT: Normocephalic HEART: normal rate RESP: normal effort ABDOMEN: Soft, non-tender. Positive bowel sounds x4. No CVA tenderness. EXTREMITIES: Nontender, no edema NEURO: Alert and oriented SPECULUM EXAM: NEFG, physiologic discharge, no blood noted, cervix slightly friable. 1 cm ectropion. BIMANUAL: cervix closed; uterus normal size, no adnexal tenderness or masses. No cervical motion tenderness.  LAB RESULTS Results for orders placed during the hospital encounter of 02/17/14 (from the past 24 hour(s))  URINALYSIS, ROUTINE W REFLEX MICROSCOPIC     Status: Abnormal   Collection Time    02/17/14 10:12 PM      Result Value Ref Range   Color, Urine YELLOW  YELLOW   APPearance CLEAR  CLEAR   Specific Gravity, Urine 1.025  1.005 - 1.030   pH 6.0  5.0 - 8.0   Glucose, UA NEGATIVE  NEGATIVE mg/dL   Hgb urine dipstick TRACE (*) NEGATIVE   Bilirubin Urine NEGATIVE  NEGATIVE   Ketones, ur NEGATIVE  NEGATIVE mg/dL  Protein, ur NEGATIVE  NEGATIVE mg/dL   Urobilinogen, UA 0.2  0.0 - 1.0 mg/dL   Nitrite NEGATIVE  NEGATIVE   Leukocytes, UA NEGATIVE  NEGATIVE  URINE MICROSCOPIC-ADD ON     Status: Abnormal   Collection Time    02/17/14 10:12 PM      Result Value Ref Range   Squamous Epithelial / LPF FEW (*) RARE   WBC, UA 0-2  <3 WBC/hpf   RBC / HPF 0-2  <3 RBC/hpf   Bacteria, UA RARE  RARE  POCT PREGNANCY, URINE     Status: None   Collection Time    02/17/14 10:40 PM      Result Value Ref Range   Preg Test, Ur NEGATIVE  NEGATIVE  HCG, QUANTITATIVE, PREGNANCY     Status: Abnormal   Collection Time    02/17/14 11:54 PM      Result Value Ref Range   hCG, Beta Chain, Quant, S 62 (*) <5 mIU/mL  CBC     Status:  Abnormal   Collection Time    02/17/14 11:54 PM      Result Value Ref Range   WBC 8.7  4.0 - 10.5 K/uL   RBC 4.21  3.87 - 5.11 MIL/uL   Hemoglobin 11.2 (*) 12.0 - 15.0 g/dL   HCT 74.1 (*) 63.8 - 45.3 %   MCV 82.7  78.0 - 100.0 fL   MCH 26.6  26.0 - 34.0 pg   MCHC 32.2  30.0 - 36.0 g/dL   RDW 64.6 (*) 80.3 - 21.2 %   Platelets 291  150 - 400 K/uL  WET PREP, GENITAL     Status: Abnormal   Collection Time    02/18/14  3:30 AM      Result Value Ref Range   Yeast Wet Prep HPF POC NONE SEEN  NONE SEEN   Trich, Wet Prep NONE SEEN  NONE SEEN   Clue Cells Wet Prep HPF POC NONE SEEN  NONE SEEN   WBC, Wet Prep HPF POC MODERATE (*) NONE SEEN    IMAGING US Ob Comp Less 14 Wks  02/18/2014   CLINICAL DATA:  Left lower quadrant pain  EXAM: OBSTETRIC <14 WK [place] AND TRANSVAGINAL OB US  TECHNIQUE: Both transabdominal and transvaginal ultrasound examinations were performed for complete evaluation of the gestation as well as the maternal uterus, adnexal regions, and pelvic cul-de-sac. Transvaginal technique was performed to assess early pregnancy.  COMPARISON:  None.  FINDINGS: Intrauterine gestational sac: Not visualized  Yolk sac:  Not applicable  Embryo:  Not applicable  Cardiac Activity: Not applicable  Heart Rate:  Not applicable bpm  Maternal uterus/adnexae: Right ovary is normal. 3.6 x 1.7 x 3.7 cm complex cystic lesion with thick septations in the left ovary. Small amount of free fluid is noted. No pelvic mass.  IMPRESSION: No gestational sac can be identified. This is nonspecific and differential diagnosis includes early pregnancy, spontaneous abortion, or ectopic pregnancy. Serial beta HCG levels are warranted. Followup ultrasound in 1 week is recommended to ensure development of an embryo.  Complex cystic lesion in the left ovary. Followup study is recommended to ensure resolution.   Electronically Signed   By: Maryclare Bean M.D.   On: 02/18/2014 01:45   US Ob Transvaginal  02/18/2014   CLINICAL DATA:   Left lower quadrant pain  EXAM: OBSTETRIC <14 WK [place] AND TRANSVAGINAL OB US  TECHNIQUE: Both transabdominal and transvaginal ultrasound examinations were performed for complete evaluation of the  gestation as well as the maternal uterus, adnexal regions, and pelvic cul-de-sac. Transvaginal technique was performed to assess early pregnancy.  COMPARISON:  None.  FINDINGS: Intrauterine gestational sac: Not visualized  Yolk sac:  Not applicable  Embryo:  Not applicable  Cardiac Activity: Not applicable  Heart Rate:  Not applicable bpm  Maternal uterus/adnexae: Right ovary is normal. 3.6 x 1.7 x 3.7 cm complex cystic lesion with thick septations in the left ovary. Small amount of free fluid is noted. No pelvic mass.  IMPRESSION: No gestational sac can be identified. This is nonspecific and differential diagnosis includes early pregnancy, spontaneous abortion, or ectopic pregnancy. Serial beta HCG levels are warranted. Followup ultrasound in 1 week is recommended to ensure development of an embryo.  Complex cystic lesion in the left ovary. Followup study is recommended to ensure resolution.   Electronically Signed   By: Maryclare Bean M.D.   On: 02/18/2014 01:45    MAU COURSE Quant, CBC, pelvic ultrasound.  ASSESSMENT 1. Pregnancy related abdominal pain of lower quadrant, antepartum   2. Complex ovarian cyst     PLAN Discharge home in stable condition. Ectopic and SAB precautions. In-basket message sent to Mitchel Honour D.O. (on-call provider for [facility]) to discuss complex cyst followup at patient's new OB visit on 02/22/2014. GC/CT cultures pending.     Follow-up Information   Follow up with [facility] On 02/20/2014. (4 repeat blood work or sooner as needed if symptoms worsen)    Contact information:   [address] [id number] [place] [address] [phone]       Medication List         acetaminophen 325 MG tablet  Commonly  known as:  TYLENOL  Take 650 mg by mouth every 4 (four) hours as needed. For pain     CONCEPT OB 130-92.4-1 MG Caps  Take 1 tablet by mouth daily.       [place], CNM 02/18/2014  3:42 AM

## 2014-02-18 ENCOUNTER — Inpatient Hospital Stay (HOSPITAL_COMMUNITY): Payer: BC Managed Care – PPO

## 2014-02-18 ENCOUNTER — Encounter (HOSPITAL_COMMUNITY): Payer: Self-pay | Admitting: *Deleted

## 2014-02-18 DIAGNOSIS — R109 Unspecified abdominal pain: Secondary | ICD-10-CM

## 2014-02-18 DIAGNOSIS — O9989 Other specified diseases and conditions complicating pregnancy, childbirth and the puerperium: Secondary | ICD-10-CM

## 2014-02-18 LAB — CBC
HCT: 34.8 % — ABNORMAL LOW (ref 36.0–46.0)
HEMOGLOBIN: 11.2 g/dL — AB (ref 12.0–15.0)
MCH: 26.6 pg (ref 26.0–34.0)
MCHC: 32.2 g/dL (ref 30.0–36.0)
MCV: 82.7 fL (ref 78.0–100.0)
PLATELETS: 291 10*3/uL (ref 150–400)
RBC: 4.21 MIL/uL (ref 3.87–5.11)
RDW: 15.8 % — ABNORMAL HIGH (ref 11.5–15.5)
WBC: 8.7 10*3/uL (ref 4.0–10.5)

## 2014-02-18 LAB — WET PREP, GENITAL
Clue Cells Wet Prep HPF POC: NONE SEEN
TRICH WET PREP: NONE SEEN
Yeast Wet Prep HPF POC: NONE SEEN

## 2014-02-18 LAB — HCG, QUANTITATIVE, PREGNANCY: HCG, BETA CHAIN, QUANT, S: 62 m[IU]/mL — AB (ref <5)

## 2014-02-18 MED ORDER — CONCEPT OB 130-92.4-1 MG PO CAPS: 1.0000 | ORAL_CAPSULE | Freq: Every day | ORAL | Status: DC

## 2014-02-18 NOTE — Discharge Instructions (Signed)
Abdominal Pain During Pregnancy °Abdominal pain is common in pregnancy. Most of the time, it does not cause harm. There are many causes of abdominal pain. Some causes are more serious than others. Some of the causes of abdominal pain in pregnancy are easily diagnosed. Occasionally, the diagnosis takes time to understand. Other times, the cause is not determined. Abdominal pain can be a sign that something is very wrong with the pregnancy, or the pain may have nothing to do with the pregnancy at all. For this reason, always tell your health care provider if you have any abdominal discomfort. °HOME CARE INSTRUCTIONS  °Monitor your abdominal pain for any changes. The following actions may help to alleviate any discomfort you are experiencing: °· Do not have sexual intercourse or put anything in your vagina until your symptoms go away completely. °· Get plenty of rest until your pain improves. °· Drink clear fluids if you feel nauseous. Avoid solid food as long as you are uncomfortable or nauseous. °· Only take over-the-counter or prescription medicine as directed by your health care provider. °· Keep all follow-up appointments with your health care provider. °SEEK IMMEDIATE MEDICAL CARE IF: °· You are bleeding, leaking fluid, or passing tissue from the vagina. °· You have increasing pain or cramping. °· You have persistent vomiting. °· You have painful or bloody urination. °· You have a fever. °· You notice a decrease in your baby's movements. °· You have extreme weakness or feel faint. °· You have shortness of breath, with or without abdominal pain. °· You develop a severe headache with abdominal pain. °· You have abnormal vaginal discharge with abdominal pain. °· You have persistent diarrhea. °· You have abdominal pain that continues even after rest, or gets worse. °MAKE SURE YOU:  °· Understand these instructions. °· Will watch your condition. °· Will get help right away if you are not doing well or get  worse. °Document Released: 10/14/2005 Document Revised: 08/04/2013 Document Reviewed: 05/13/2013 °ExitCare® Patient Information ©2014 [employer]. ° °Ovarian Cyst °An ovarian cyst is a fluid-filled sac that forms on an ovary. The ovaries are small organs that produce eggs in women. Various types of cysts can form on the ovaries. Most are not cancerous. Many do not cause problems, and they often go away on their own. Some may cause symptoms and require treatment. Common types of ovarian cysts include: °· Functional cysts These cysts may occur every month during the menstrual cycle. This is normal. The cysts usually go away with the next menstrual cycle if the woman does not get pregnant. Usually, there are no symptoms with a functional cyst. °· Endometrioma cysts These cysts form from the tissue that lines the uterus. They are also called "chocolate cysts" because they become filled with blood that turns brown. This type of cyst can cause pain in the lower abdomen during intercourse and with your menstrual period. °· Cystadenoma cysts This type develops from the cells on the outside of the ovary. These cysts can get very big and cause lower abdomen pain and pain with intercourse. This type of cyst can twist on itself, cut off its blood supply, and cause severe pain. It can also easily rupture and cause a lot of pain. °· Dermoid cysts This type of cyst is sometimes found in both ovaries. These cysts may contain different kinds of body tissue, such as skin, teeth, hair, or cartilage. They usually do not cause symptoms unless they get very big. °· Theca lutein cysts These cysts occur   when too much of a certain hormone (human chorionic gonadotropin) is produced and overstimulates the ovaries to produce an egg. This is most common after procedures used to assist with the conception of a baby (in vitro fertilization). °CAUSES  °· Fertility drugs can cause a condition in which multiple large cysts are formed on the  ovaries. This is called ovarian hyperstimulation syndrome. °· A condition called polycystic ovary syndrome can cause hormonal imbalances that can lead to nonfunctional ovarian cysts. °SIGNS AND SYMPTOMS  °Many ovarian cysts do not cause symptoms. If symptoms are present, they may include: °· Pelvic pain or pressure. °· Pain in the lower abdomen. °· Pain during sexual intercourse. °· Increasing girth (swelling) of the abdomen. °· Abnormal menstrual periods. °· Increasing pain with menstrual periods. °· Stopping having menstrual periods without being pregnant. °DIAGNOSIS  °These cysts are commonly found during a routine or annual pelvic exam. Tests may be ordered to find out more about the cyst. These tests may include: °· Ultrasound. °· X-ray of the pelvis. °· CT scan. °· MRI. °· Blood tests. °TREATMENT  °Many ovarian cysts go away on their own without treatment. Your health care provider may want to check your cyst regularly for 2 3 months to see if it changes. For women in menopause, it is particularly important to monitor a cyst closely because of the higher rate of ovarian cancer in menopausal women. When treatment is needed, it may include any of the following: °· A procedure to drain the cyst (aspiration). This may be done using a long needle and ultrasound. It can also be done through a laparoscopic procedure. This involves using a thin, lighted tube with a tiny camera on the end (laparoscope) inserted through a small incision. °· Surgery to remove the whole cyst. This may be done using laparoscopic surgery or an open surgery involving a larger incision in the lower abdomen. °· Hormone treatment or birth control pills. These methods are sometimes used to help dissolve a cyst. °HOME CARE INSTRUCTIONS  °· Only take over-the-counter or prescription medicines as directed by your health care provider. °· Follow up with your health care provider as directed. °· Get regular pelvic exams and Pap tests. °SEEK MEDICAL  CARE IF:  °· Your periods are late, irregular, or painful, or they stop. °· Your pelvic pain or abdominal pain does not go away. °· Your abdomen becomes larger or swollen. °· You have pressure on your bladder or trouble emptying your bladder completely. °· You have pain during sexual intercourse. °· You have feelings of fullness, pressure, or discomfort in your stomach. °· You lose weight for no apparent reason. °· You feel generally ill. °· You become constipated. °· You lose your appetite. °· You develop acne. °· You have an increase in body and facial hair. °· You are gaining weight, without changing your exercise and eating habits. °· You think you are pregnant. °SEEK IMMEDIATE MEDICAL CARE IF:  °· You have increasing abdominal pain. °· You feel sick to your stomach (nauseous), and you throw up (vomit). °· You develop a fever that comes on suddenly. °· You have abdominal pain during a bowel movement. °· Your menstrual periods become heavier than usual. °Document Released: 10/14/2005 Document Revised: 08/04/2013 Document Reviewed: 06/21/2013 °ExitCare® Patient Information ©2014 [employer]. ° °

## 2014-02-20 ENCOUNTER — Inpatient Hospital Stay (HOSPITAL_COMMUNITY)
Admission: AD | Admit: 2014-02-20 | Discharge: 2014-02-20 | Disposition: A | Discharge status: Home / Self Care | Payer: BC Managed Care – PPO | Source: Ambulatory Visit | Attending: Obstetrics & Gynecology | Admitting: Obstetrics & Gynecology

## 2014-02-20 ENCOUNTER — Encounter (HOSPITAL_COMMUNITY): Payer: Self-pay | Admitting: *Deleted

## 2014-02-20 DIAGNOSIS — O200 Threatened abortion: Secondary | ICD-10-CM

## 2014-02-20 DIAGNOSIS — R1032 Left lower quadrant pain: Secondary | ICD-10-CM | POA: Insufficient documentation

## 2014-02-20 LAB — HCG, QUANTITATIVE, PREGNANCY: hCG, Beta Chain, Quant, S: 67 m[IU]/mL — ABNORMAL HIGH (ref <5)

## 2014-02-20 NOTE — MAU Note (Signed)
Pt in for follow up BHCG.  Pt continues to have LLQ pain.  Pt reports having small amt dark red bleeding that started this am.  One pad today.

## 2014-02-20 NOTE — Discharge Instructions (Signed)
Ectopic Pregnancy °An ectopic pregnancy is when the fertilized egg attaches (implants) outside the uterus. Most ectopic pregnancies occur in the fallopian tube. Rarely do ectopic pregnancies occur on the ovary, intestine, pelvis, or cervix. In an ectopic pregnancy, the fertilized egg does not have the ability to develop into a normal, healthy baby.  °A ruptured ectopic pregnancy is one in which the fallopian tube gets torn or bursts and results in internal bleeding. Often there is intense abdominal pain, and sometimes, vaginal bleeding. Having an ectopic pregnancy can be life threatening. If left untreated, this dangerous condition can lead to a blood transfusion, abdominal surgery, or even death. °CAUSES  °Damage to the fallopian tubes is the suspected cause in most ectopic pregnancies.  °RISK FACTORS °Depending on your circumstances, the risk of having an ectopic pregnancy will vary. The level of risk can be divided into three categories. °High Risk °· You have gone through infertility treatment. °· You have had a previous ectopic pregnancy. °· You have had previous tubal surgery. °· You have had previous surgery to have the fallopian tubes tied (tubal ligation). °· You have tubal problems or diseases. °· You have been exposed to DES. DES is a medicine that was used until 1971 and had effects on babies whose mothers took the medicine. °· You become pregnant while using an intrauterine device (IUD) for birth control.  °Moderate Risk °· You have a history of infertility. °· You have a history of a sexually transmitted infection (STI). °· You have a history of pelvic inflammatory disease (PID). °· You have scarring from endometriosis. °· You have multiple sexual partners. °· You smoke.  °Low Risk °· You have had previous pelvic surgery. °· You use vaginal douching. °· You became sexually active before 32 years of age. °SIGNS AND SYMPTOMS  °An ectopic pregnancy should be suspected in anyone who has missed a period and  has abdominal pain or bleeding. °· You may experience normal pregnancy symptoms, such as: °· Nausea. °· Tiredness. °· Breast tenderness. °· Other symptoms may include: °· Pain with intercourse. °· Irregular vaginal bleeding or spotting. °· Cramping or pain on one side or in the lower abdomen. °· Fast heartbeat. °· Passing out while having a bowel movement. °· Symptoms of a ruptured ectopic pregnancy and internal bleeding may include: °· Sudden, severe pain in the abdomen and pelvis. °· Dizziness or fainting. °· Pain in the shoulder area. °DIAGNOSIS  °Tests that may be performed include: °· A pregnancy test. °· An ultrasound test. °· Testing the specific level of pregnancy hormone in the bloodstream. °· Taking a sample of uterus tissue (dilation and curettage, D&C). °· Surgery to perform a visual exam of the inside of the abdomen using a thin, lighted tube with a tiny camera on the end (laparoscope). °TREATMENT  °An injection of a medicine called methotrexate may be given. This medicine causes the pregnancy tissue to be absorbed. It is given if: °· The diagnosis is made early. °· The fallopian tube has not ruptured. °· You are considered to be a good candidate for the medicine. °Usually, pregnancy hormone blood levels are checked after methotrexate treatment. This is to be sure the medicine is effective. It may take 4 6 weeks for the pregnancy to be absorbed (though most pregnancies will be absorbed by 3 weeks). °Surgical treatment may be needed. A laparoscope may be used to remove the pregnancy tissue. If severe internal bleeding occurs, a cut (incision) may be made in the lower abdomen (laparotomy), and the   ectopic pregnancy is removed. This stops the bleeding. Part of the fallopian tube, or the whole tube, may be removed as well (salpingectomy). After surgery, pregnancy hormone tests may be done to be sure there is no pregnancy tissue left. You may receive an Rho(D) immune globulin shot if you are Rh negative and  the father is Rh positive, or if you do not know the Rh type of the father. This is to prevent problems with any future pregnancy. °SEEK IMMEDIATE MEDICAL CARE IF:  °You have any symptoms of an ectopic pregnancy. This is a medical emergency. °Document Released: 11/21/2004 Document Revised: 08/04/2013 Document Reviewed: 05/13/2013 °ExitCare® Patient Information ©2014 [employer]. ° °

## 2014-02-20 NOTE — MAU Provider Note (Signed)
  History     CSN: [id number]  Arrival date and time: 02/20/14 2159   None     Chief Complaint  Patient presents with  . Follow-up   HPI  Dianelys Gengler is a 33 y.o. G2P0010 who presents today for FU Hcg. She states that she still has intermittent LLQ pain, and light bleeding.   Past Medical History  Diagnosis Date  . Ectopic pregnancy     L adnexa    Past Surgical History  Procedure Laterality Date  . Arm surgery      Family History  Problem Relation Age of Onset  . Cancer Mother   . Heart disease Mother   . Heart disease Father   . Cancer Maternal Aunt   . Diabetes Maternal Grandmother     History  Substance Use Topics  . Smoking status: Never Smoker   . Smokeless tobacco: Never Used  . Alcohol Use: No    Allergies:  Allergies  Allergen Reactions  . Shellfish Allergy Anaphylaxis    Prescriptions prior to admission  Medication Sig Dispense Refill  . acetaminophen (TYLENOL) 325 MG tablet Take 650 mg by mouth every 4 (four) hours as needed. For pain      . Prenat w/o A Vit-FeFum-FePo-FA (CONCEPT OB) 130-92.4-1 MG CAPS Take 1 tablet by mouth daily.  30 capsule  12    ROS Physical Exam   Blood pressure 135/78, pulse 84, temperature 98.6 F (37 C), temperature source Oral, resp. rate 16, height 4\' 10"  (1.473 m), weight 80.65 kg (177 lb 12.8 oz), last menstrual period 12/23/2013, unknown if currently breastfeeding.  Physical Exam  Nursing note and vitals reviewed. Constitutional: She is oriented to person, place, and time. She appears well-developed and well-nourished. No distress.  Cardiovascular: Normal rate.   Respiratory: Effort normal.  GI: There is no tenderness.  Neurological: She is alert and oriented to person, place, and time.  Skin: Skin is warm and dry.  Psychiatric: She has a normal mood and affect.    MAU Course  Procedures  Results for VADA, LARTIGUE (MRN [id number]) as of 02/20/2014 22:54  Ref. Range 02/17/2014 23:54 02/18/2014  01:40 02/18/2014 03:30 02/20/2014 22:15  hCG, Beta Chain, Quant, S Latest Range: <5 mIU/mL 62 (H)   67 (H)   2256: C/W Dr. Despina Hidden, plan to repeat HCG on Wednesday morning (48 more hours).   Assessment and Plan   1. Threatened abortion    Ectopic precautions reviewed FU in 48 hours for repeat HCG Return to MAU sooner if needed   Tawnya Crook 02/20/2014, 10:55 PM

## 2014-02-22 LAB — GC/CHLAMYDIA PROBE AMP
CT PROBE, AMP APTIMA: NEGATIVE
GC Probe RNA: NEGATIVE

## 2014-02-25 ENCOUNTER — Encounter (HOSPITAL_COMMUNITY): Payer: Self-pay

## 2014-02-25 ENCOUNTER — Inpatient Hospital Stay (HOSPITAL_COMMUNITY)
Admission: AD | Admit: 2014-02-25 | Discharge: 2014-02-25 | Disposition: A | Discharge status: Home / Self Care | Payer: BC Managed Care – PPO | Source: Ambulatory Visit | Attending: Obstetrics & Gynecology | Admitting: Obstetrics & Gynecology

## 2014-02-25 DIAGNOSIS — O0281 Inappropriate change in quantitative human chorionic gonadotropin (hCG) in early pregnancy: Secondary | ICD-10-CM

## 2014-02-25 DIAGNOSIS — O3680X0 Pregnancy with inconclusive fetal viability, not applicable or unspecified: Secondary | ICD-10-CM | POA: Insufficient documentation

## 2014-02-25 DIAGNOSIS — O009 Unspecified ectopic pregnancy without intrauterine pregnancy: Secondary | ICD-10-CM

## 2014-02-25 DIAGNOSIS — O00109 Unspecified tubal pregnancy without intrauterine pregnancy: Secondary | ICD-10-CM | POA: Insufficient documentation

## 2014-02-25 LAB — CBC WITH DIFFERENTIAL/PLATELET
Basophils Absolute: 0 10*3/uL (ref 0.0–0.1)
Basophils Relative: 0 % (ref 0–1)
Eosinophils Absolute: 0.8 10*3/uL — ABNORMAL HIGH (ref 0.0–0.7)
Eosinophils Relative: 9 % — ABNORMAL HIGH (ref 0–5)
HCT: 34.2 % — ABNORMAL LOW (ref 36.0–46.0)
Hemoglobin: 10.9 g/dL — ABNORMAL LOW (ref 12.0–15.0)
LYMPHS ABS: 2.8 10*3/uL (ref 0.7–4.0)
LYMPHS PCT: 35 % (ref 12–46)
MCH: 26.5 pg (ref 26.0–34.0)
MCHC: 31.9 g/dL (ref 30.0–36.0)
MCV: 83 fL (ref 78.0–100.0)
MONO ABS: 0.5 10*3/uL (ref 0.1–1.0)
Monocytes Relative: 6 % (ref 3–12)
Neutro Abs: 3.9 10*3/uL (ref 1.7–7.7)
Neutrophils Relative %: 49 % (ref 43–77)
Platelets: 253 10*3/uL (ref 150–400)
RBC: 4.12 MIL/uL (ref 3.87–5.11)
RDW: 16.2 % — AB (ref 11.5–15.5)
WBC: 8.1 10*3/uL (ref 4.0–10.5)

## 2014-02-25 LAB — COMPREHENSIVE METABOLIC PANEL
ALT: 12 U/L (ref 0–35)
AST: 17 U/L (ref 0–37)
Albumin: 3.8 g/dL (ref 3.5–5.2)
Alkaline Phosphatase: 68 U/L (ref 39–117)
BUN: 11 mg/dL (ref 6–23)
CALCIUM: 9.3 mg/dL (ref 8.4–10.5)
CO2: 24 meq/L (ref 19–32)
CREATININE: 0.59 mg/dL (ref 0.50–1.10)
Chloride: 100 mEq/L (ref 96–112)
GFR calc Af Amer: >90 mL/min (ref >90)
GLUCOSE: 95 mg/dL (ref 70–99)
Potassium: 3.9 mEq/L (ref 3.7–5.3)
Sodium: 138 mEq/L (ref 137–147)
Total Bilirubin: <0.2 mg/dL — ABNORMAL LOW (ref 0.3–1.2)
Total Protein: 7.1 g/dL (ref 6.0–8.3)

## 2014-02-25 LAB — HCG, QUANTITATIVE, PREGNANCY: hCG, Beta Chain, Quant, S: 85 m[IU]/mL — ABNORMAL HIGH (ref <5)

## 2014-02-25 MED ORDER — METHOTREXATE INJECTION FOR WOMEN'S HOSPITAL
50.0000 mg/m2 | Freq: Once | INTRAMUSCULAR | Status: AC
  Administered 2014-02-25: 90 mg via INTRAMUSCULAR
  Filled 2014-02-25: qty 1.8

## 2014-02-25 NOTE — MAU Note (Addendum)
Pt presents from Dr. Lisbeth Ply office for methotrexate injection. States Dr. Arelia Sneddon sent her here after multiple HCG and ultrasounds this week. Denies vaginal bleeding, discharge and pain

## 2014-02-25 NOTE — MAU Provider Note (Signed)
Chief Complaint: No chief complaint on file.   First Provider Initiated Contact with Patient 02/25/14 1636     SUBJECTIVE HPI: Alice Hays is a 33 y.o. G2P0010 at [redacted]w[redacted]d by LMP who presents to maternity admissions sent from the office for ectopic pregnancy.  She was seen in MAU on 4/23 for abdominal pain and light vaginal bleeding and had quant hcg of 62.  No IUP or ectopic visualized on ultrasound.  On 4/26, her quant hcg was 67.  She was then seen in the office on 4/28 and 4/30 with quant hcgs of 90 and 82.  Ultrasound was repeated in the office with no visible IUP or ectopic.  She has hx of ectopic pregnancy treated with MTX 1 year ago.  Today she denies abdominal pain, vaginal bleeding, dizziness, or fever/chills.     Past Medical History  Diagnosis Date  . Ectopic pregnancy     L adnexa   Past Surgical History  Procedure Laterality Date  . Arm surgery     History   Social History  . Marital Status: Single    Spouse Name: N/A    Number of Children: N/A  . Years of Education: N/A   Occupational History  . Not on file.   Social History Main Topics  . Smoking status: Never Smoker   . Smokeless tobacco: Never Used  . Alcohol Use: No  . Drug Use: No  . Sexual Activity: Yes    Birth Control/ Protection: None   Other Topics Concern  . Not on file   Social History Narrative  . No narrative on file   No current facility-administered medications on file prior to encounter.   Current Outpatient Prescriptions on File Prior to Encounter  Medication Sig Dispense Refill  . Prenat w/o A Vit-FeFum-FePo-FA (CONCEPT OB) 130-92.4-1 MG CAPS Take 1 tablet by mouth daily.  30 capsule  12   Allergies  Allergen Reactions  . Shellfish Allergy Anaphylaxis    ROS: Pertinent items in HPI  OBJECTIVE Blood pressure 142/97, pulse 84, temperature 98.4 F (36.9 C), temperature source Oral, resp. rate 18, height 4\' 10"  (1.473 m), weight 79.833 kg (176 lb), last menstrual period  12/23/2013, unknown if currently breastfeeding.   GENERAL: Well-developed, well-nourished female in no acute distress.  HEENT: Normocephalic HEART: normal rate RESP: normal effort ABDOMEN: Soft, non-tender EXTREMITIES: Nontender, no edema NEURO: Alert and oriented SPECULUM EXAM: Deferred  LAB RESULTS Results for orders placed during the hospital encounter of 02/25/14 (from the past 24 hour(s))  HCG, QUANTITATIVE, PREGNANCY     Status: Abnormal   Collection Time    02/25/14  3:34 PM      Result Value Ref Range   hCG, Beta Chain, Quant, S 85 (*) <5 mIU/mL  CBC WITH DIFFERENTIAL     Status: Abnormal   Collection Time    02/25/14  3:34 PM      Result Value Ref Range   WBC 8.1  4.0 - 10.5 K/uL   RBC 4.12  3.87 - 5.11 MIL/uL   Hemoglobin 10.9 (*) 12.0 - 15.0 g/dL   HCT 16.1 (*) 09.6 - 04.5 %   MCV 83.0  78.0 - 100.0 fL   MCH 26.5  26.0 - 34.0 pg   MCHC 31.9  30.0 - 36.0 g/dL   RDW 40.9 (*) 81.1 - 91.4 %   Platelets 253  150 - 400 K/uL   Neutrophils Relative % 49  43 - 77 %   Neutro Abs 3.9  1.7 - 7.7 K/uL   Lymphocytes Relative 35  12 - 46 %   Lymphs Abs 2.8  0.7 - 4.0 K/uL   Monocytes Relative 6  3 - 12 %   Monocytes Absolute 0.5  0.1 - 1.0 K/uL   Eosinophils Relative 9 (*) 0 - 5 %   Eosinophils Absolute 0.8 (*) 0.0 - 0.7 K/uL   Basophils Relative 0  0 - 1 %   Basophils Absolute 0.0  0.0 - 0.1 K/uL  COMPREHENSIVE METABOLIC PANEL     Status: Abnormal   Collection Time    02/25/14  3:34 PM      Result Value Ref Range   Sodium 138  137 - 147 mEq/L   Potassium 3.9  3.7 - 5.3 mEq/L   Chloride 100  96 - 112 mEq/L   CO2 24  19 - 32 mEq/L   Glucose, Bld 95  70 - 99 mg/dL   BUN 11  6 - 23 mg/dL   Creatinine, Ser 1.610.59  0.50 - 1.10 mg/dL   Calcium 9.3  8.4 - 09.610.5 mg/dL   Total Protein 7.1  6.0 - 8.3 g/dL   Albumin 3.8  3.5 - 5.2 g/dL   AST 17  0 - 37 U/L   ALT 12  0 - 35 U/L   Alkaline Phosphatase 68  39 - 117 U/L   Total Bilirubin <0.2 (*) 0.3 - 1.2 mg/dL   GFR calc non  Af Amer >90  >90 mL/min   GFR calc Af Amer >90  >90 mL/min     ASSESSMENT 1. Ectopic pregnancy   2. Inappropriate change in quantitative hCG in early pregnancy     PLAN Consult Dr Langston MaskerMorris MTX dose given Discharge home with ectopic precautions Pt to f/u in office for Day 4 lab, Day 7 lab and provider visit Return to MAU sooner as needed for emergencies    Medication List         CONCEPT OB 130-92.4-1 MG Caps  Take 1 tablet by mouth daily.       Follow-up Information   Schedule an appointment as soon as possible for a visit with [facility], Rochel Brome.A.. (On Monday for labs and Thursday for lab/follow-up visit. Return to MAU as needed for emergencies. )    Contact information:   [address] [place] [phone]      Sharen CounterLisa Leftwich-Kirby Certified Nurse-Midwife 02/25/2014  8:20 PM

## 2014-02-25 NOTE — MAU Note (Signed)
Pt left after speaking with provider. Unable to get discharge vital signs.

## 2014-02-25 NOTE — MAU Note (Signed)
Urine in lab 

## 2014-03-06 ENCOUNTER — Inpatient Hospital Stay (HOSPITAL_COMMUNITY)
Admission: AD | Admit: 2014-03-06 | Discharge: 2014-03-06 | Disposition: A | Discharge status: Home / Self Care | Payer: BC Managed Care – PPO | Source: Ambulatory Visit | Attending: Obstetrics and Gynecology | Admitting: Obstetrics and Gynecology

## 2014-03-06 ENCOUNTER — Encounter (HOSPITAL_COMMUNITY): Payer: Self-pay | Admitting: Advanced Practice Midwife

## 2014-03-06 DIAGNOSIS — O00109 Unspecified tubal pregnancy without intrauterine pregnancy: Secondary | ICD-10-CM | POA: Insufficient documentation

## 2014-03-06 DIAGNOSIS — O009 Unspecified ectopic pregnancy without intrauterine pregnancy: Secondary | ICD-10-CM

## 2014-03-06 LAB — HCG, QUANTITATIVE, PREGNANCY: hCG, Beta Chain, Quant, S: 71 m[IU]/mL — ABNORMAL HIGH (ref <5)

## 2014-03-06 NOTE — Discharge Instructions (Signed)
Methotrexate Treatment for an Ectopic Pregnancy An ectopic pregnancy is when the fertilized egg attaches (implants) outside the uterus. Most ectopic pregnancies occur in the fallopian tube. Rarely do ectopic pregnancies occur on the ovary, intestine, pelvis, or cervix. An ectopic pregnancy does not have the ability to develop into a normal, healthy baby. Having an ectopic pregnancy can be a life-threatening experience. However, if the ectopic pregnancy is found early enough, it can be treated with a medicine. This medicine is called methotrexate. Methotrexate works by stopping the pregnancy from growing. It helps the body absorb the pregnancy tissue over a 2 to 6 week period (though most pregnancies will be absorbed by 3 weeks).  If methotrexate is successful, there is a good chance that the fallopian tube may be saved. Regardless of whether the fallopian tube is saved, a mother who has had an ectopic pregnancy is at a much higher risk of having another ectopic occur in future pregnancies. One serious concern is the potential for the fallopian tube to tear (rupture). If it does, emergency surgery is needed to remove the pregnancy, and methotrexate cannot be used. The ideal patient for methotrexate is a person who is:   Not bleeding internally.  Has no severe or persistent abdominal pain.  Is committed to following through with lab tests and appointments until the ectopic has absorbed.  Is healthy and has normal liver and kidney functions on evaluation. Methotrexate should not be given to women who:  Are breastfeeding.  Have a normal pregnancy (intrauterine pregnancy).  Have liver, lung, or kidney disease.  Have blood problems.  Are allergic to methotrexate.  Have peptic ulcers.  Have an ectopic pregnancy larger than 1 inches (3.5 cm) or one that has fetal heartbeats. This is a rule that is followed most of the time (relative contraindication). BEFORE THE TREATMENT Before giving the  medicine:  Liver tests, kidney tests, and a complete blood test are performed.  Blood tests are performed to measure the pregnancy hormone levels and to determine the mother's blood type.  If the woman is Rh negative, and the father is Rh positive or his Rh type is not known, a RhoGAM shot is given. TREATMENT  There are 2 methods that your caregiver may use to prescribe methotrexate. One method involves a single dose or injection of the medicine. Another method involves a series of doses. This method involves several injections.  AFTER THE TREATMENT Blood tests will be taken for several weeks to check the pregnancy hormone levels. The blood tests are performed until there is no more pregnancy hormone detected in the blood. There is still a risk of the ectopic pregnancy rupturing while using the methotrexate. There are also side effects of methotrexate, which include:   Nausea and vomiting.  Mouth sores.  Diarrhea.  Rash.  Dizziness.  Increased abdominal pain.  Increased vaginal bleeding or spotting.  Pneumonia.  Failed treatment.  Hair loss. This is rare and reversible. On very rare occasions, the medicine may affect your blood counts, liver, kidney, bone marrow, or hormone levels. If this happens, your caregiver will want to perform further evaluations. Document Released: 10/08/2001 Document Revised: 01/06/2012 Document Reviewed: 06/20/2011 ExitCare Patient Information 2014 [employer].   

## 2014-03-06 NOTE — MAU Note (Signed)
Pt presents to MAU for a follow up quant BHCG following methatrexate injection on the 1st of May. She states she was suppose to follow up the 4th but was not able to so she had repeat BHCG on May 7th.

## 2014-03-06 NOTE — MAU Provider Note (Signed)
Alice Hays is a 33 y.o. G2P0010 at [redacted]w[redacted]d here for follow up quant HCG day 9 s/p MTX for ectopic pregnancy. Pt missed day 4 f/u in office, had day 7 quant. Vaginal bleeding: scant staining. Abdominal pain: none.   Prior HCGs: 5/10 85, per patient report had day 7 follow up in office that had increased to 120   Past Medical History  Diagnosis Date  . Ectopic pregnancy     L adnexa    Prescriptions prior to admission  Medication Sig Dispense Refill  . Prenat w/o A Vit-FeFum-FePo-FA (CONCEPT OB) 130-92.4-1 MG CAPS Take 1 tablet by mouth daily.  30 capsule  12    Allergies  Allergen Reactions  . Shellfish Allergy Anaphylaxis    Review of Systems - negative  Objective BP 131/77  Pulse 81  Temp(Src) 98.8 F (37.1 C) (Oral)  Resp 18  LMP 12/23/2013  General: Alert, oriented, no acute distress  Results for orders placed during the hospital encounter of 03/06/14 (from the past 24 hour(s))  HCG, QUANTITATIVE, PREGNANCY     Status: Abnormal   Collection Time    03/06/14  9:17 AM      Result Value Ref Range   hCG, Beta Chain, Quant, S 71 (*) <5 mIU/mL    Assessment Ectopic pregnancy, day 10 s/p MTX, appropriate drop in HCG  Plan Pt to f/u later this week in office for quant, will call office tomorrow for appointment. Rev'd precautions     Medication List    STOP taking these medications       CONCEPT OB 130-92.4-1 MG Caps        Follow-up Information   Follow up with Juluis Mire, MD. (call tomorrow for follow up appt on Thursday or Friday)    Specialty:  Obstetrics and Gynecology   Contact information:   [address] [place], [address] [place] [address] [phone]       Archie Patten 10:45 AM 03/06/2014

## 2014-08-29 ENCOUNTER — Encounter (HOSPITAL_COMMUNITY): Payer: Self-pay | Admitting: Advanced Practice Midwife

## 2014-12-24 ENCOUNTER — Encounter (HOSPITAL_COMMUNITY): Payer: Self-pay | Admitting: *Deleted

## 2016-01-16 ENCOUNTER — Inpatient Hospital Stay (HOSPITAL_COMMUNITY)
Admission: AD | Admit: 2016-01-16 | Discharge: 2016-01-16 | Disposition: A | Discharge status: Home / Self Care | Payer: BLUE CROSS/BLUE SHIELD | Source: Ambulatory Visit | Attending: Obstetrics & Gynecology | Admitting: Obstetrics & Gynecology

## 2016-01-16 ENCOUNTER — Encounter (HOSPITAL_COMMUNITY): Payer: Self-pay

## 2016-01-16 DIAGNOSIS — O26899 Other specified pregnancy related conditions, unspecified trimester: Secondary | ICD-10-CM

## 2016-01-16 DIAGNOSIS — O26892 Other specified pregnancy related conditions, second trimester: Secondary | ICD-10-CM | POA: Insufficient documentation

## 2016-01-16 DIAGNOSIS — Z8249 Family history of ischemic heart disease and other diseases of the circulatory system: Secondary | ICD-10-CM | POA: Insufficient documentation

## 2016-01-16 DIAGNOSIS — Z3A16 16 weeks gestation of pregnancy: Secondary | ICD-10-CM | POA: Insufficient documentation

## 2016-01-16 DIAGNOSIS — R109 Unspecified abdominal pain: Secondary | ICD-10-CM | POA: Diagnosis not present

## 2016-01-16 DIAGNOSIS — O9989 Other specified diseases and conditions complicating pregnancy, childbirth and the puerperium: Secondary | ICD-10-CM

## 2016-01-16 LAB — WET PREP, GENITAL
Clue Cells Wet Prep HPF POC: NONE SEEN
SPERM: NONE SEEN
Trich, Wet Prep: NONE SEEN
Yeast Wet Prep HPF POC: NONE SEEN

## 2016-01-16 LAB — URINALYSIS, ROUTINE W REFLEX MICROSCOPIC
BILIRUBIN URINE: NEGATIVE
Glucose, UA: NEGATIVE mg/dL
Hgb urine dipstick: NEGATIVE
Ketones, ur: NEGATIVE mg/dL
Leukocytes, UA: NEGATIVE
NITRITE: NEGATIVE
PH: 7.5 (ref 5.0–8.0)
Protein, ur: NEGATIVE mg/dL
SPECIFIC GRAVITY, URINE: 1.01 (ref 1.005–1.030)

## 2016-01-16 LAB — POCT PREGNANCY, URINE: Preg Test, Ur: POSITIVE — AB

## 2016-01-16 NOTE — Discharge Instructions (Signed)
Second Trimester of Pregnancy  The second trimester is from week 13 through week 28, month 4 through 6. This is often the time in pregnancy that you feel your best. Often times, morning sickness has lessened or quit. You may have more energy, and you may get hungry more often. Your unborn baby (fetus) is growing rapidly. At the end of the sixth month, he or she is about 9 inches long and weighs about 1½ pounds. You will likely feel the baby move (quickening) between 18 and 20 weeks of pregnancy.  HOME CARE   · Avoid all smoking, herbs, and alcohol. Avoid drugs not approved by your doctor.  · Do not use any tobacco products, including cigarettes, chewing tobacco, and electronic cigarettes. If you need help quitting, ask your doctor. You may get counseling or other support to help you quit.  · Only take medicine as told by your doctor. Some medicines are safe and some are not during pregnancy.  · Exercise only as told by your doctor. Stop exercising if you start having cramps.  · Eat regular, healthy meals.  · Wear a good support bra if your breasts are tender.  · Do not use hot tubs, steam rooms, or saunas.  · Wear your seat belt when driving.  · Avoid raw meat, uncooked cheese, and liter boxes and soil used by cats.  · Take your prenatal vitamins.  · Take 1500-2000 milligrams of calcium daily starting at the 20th week of pregnancy until you deliver your baby.  · Try taking medicine that helps you poop (stool softener) as needed, and if your doctor approves. Eat more fiber by eating fresh fruit, vegetables, and whole grains. Drink enough fluids to keep your pee (urine) clear or pale yellow.  · Take warm water baths (sitz baths) to soothe pain or discomfort caused by hemorrhoids. Use hemorrhoid cream if your doctor approves.  · If you have puffy, bulging veins (varicose veins), wear support hose. Raise (elevate) your feet for 15 minutes, 3-4 times a day. Limit salt in your diet.  · Avoid heavy lifting, wear low heals,  and sit up straight.  · Rest with your legs raised if you have leg cramps or low back pain.  · Visit your dentist if you have not gone during your pregnancy. Use a soft toothbrush to brush your teeth. Be gentle when you floss.  · You can have sex (intercourse) unless your doctor tells you not to.  · Go to your doctor visits.  GET HELP IF:   · You feel dizzy.  · You have mild cramps or pressure in your lower belly (abdomen).  · You have a nagging pain in your belly area.  · You continue to feel sick to your stomach (nauseous), throw up (vomit), or have watery poop (diarrhea).  · You have bad smelling fluid coming from your vagina.  · You have pain with peeing (urination).  GET HELP RIGHT AWAY IF:   · You have a fever.  · You are leaking fluid from your vagina.  · You have spotting or bleeding from your vagina.  · You have severe belly cramping or pain.  · You lose or gain weight rapidly.  · You have trouble catching your breath and have chest pain.  · You notice sudden or extreme puffiness (swelling) of your face, hands, ankles, feet, or legs.  · You have not felt the baby move in over an hour.  · You have severe headaches that do   not go away with medicine.  · You have vision changes.     This information is not intended to replace advice given to you by your health care provider. Make sure you discuss any questions you have with your health care provider.     Document Released: 01/08/2010 Document Revised: 11/04/2014 Document Reviewed: 12/15/2012  Elsevier Interactive Patient Education ©2016 [employer]

## 2016-01-16 NOTE — MAU Note (Signed)
Pt c/o mid-lower sharp abdominal pain that started last week. Denies vaginal bleeding or discharge. Denies urinary s/s.

## 2016-01-16 NOTE — MAU Provider Note (Signed)
History     CSN: [id number]  Arrival date and time: 01/16/16 2144   First Provider Initiated Contact with Patient 01/16/16 2217      Chief Complaint  Patient presents with  . Abdominal Pain   HPI Ms. Alice Hays is a 35 y.o. G2P0010 at [redacted]w[redacted]d who presents to MAU today with complaint of abdominal pain x 1 week. The pain is mild, intermittent and midline abdomen. She rates pain at 3/10 now. She has not taken anything for pain. She states recent URI, and was taking Robitussin. She denies vaginal bleeding, discharge, UTI symptoms or fever.   OB History    Gravida Para Term Preterm AB TAB SAB Ectopic Multiple Living   Past Medical History  Diagnosis Date  . Ectopic pregnancy     L adnexa    Past Surgical History  Procedure Laterality Date  . Arm surgery      Family History  Problem Relation Age of Onset  . Cancer Mother   . Heart disease Mother   . Heart disease Father   . Cancer Maternal Aunt   . Diabetes Maternal Grandmother     Social History  Substance Use Topics  . Smoking status: Never Smoker   . Smokeless tobacco: Never Used  . Alcohol Use: No    Allergies:  Allergies  Allergen Reactions  . Shellfish Allergy Anaphylaxis  . Iodine     No prescriptions prior to admission    Review of Systems  Constitutional: Negative for fever and malaise/fatigue.  Gastrointestinal: Positive for nausea and abdominal pain. Negative for vomiting, diarrhea and constipation.  Genitourinary: Negative for dysuria, urgency and frequency.       Neg - vaginal bleeding, discharge   Physical Exam   Blood pressure 130/78, pulse 86, temperature 98.1 F (36.7 C), resp. rate 20, height  (1.473 m), weight 181 lb 9.6 oz (82.373 kg), last menstrual period 09/25/2015, SpO2 100 %, unknown if currently breastfeeding.  Physical Exam  Nursing note and vitals reviewed. Constitutional: She is oriented to person, place, and time. She appears well-developed  and well-nourished. No distress.  HENT:  Head: Normocephalic and atraumatic.  Cardiovascular: Normal rate.   Respiratory: Effort normal.  GI: Soft. She exhibits no distension and no mass. There is no tenderness. There is no rebound and no guarding.  Genitourinary: Uterus is enlarged (appropriate for GA). Uterus is not tender. Cervix exhibits no motion tenderness, no discharge and no friability. Right adnexum displays no mass and no tenderness. Left adnexum displays no mass and no tenderness. No bleeding in the vagina. Vaginal discharge (small amount of thin, white discharge noted) found.  Neurological: She is alert and oriented to person, place, and time.  Skin: Skin is warm and dry. No erythema.  Psychiatric: She has a normal mood and affect.  Dilation: Closed Effacement (%): Thick Cervical Position: Posterior Exam by:: Magnus Sinning, PA-C   Results for orders placed or performed during the hospital encounter of 01/16/16 (from the past 24 hour(s))  Urinalysis, Routine w reflex microscopic (not at [facility])     Status: None   Collection Time: 01/16/16 10:02 PM  Result Value Ref Range   Color, Urine YELLOW YELLOW   APPearance CLEAR CLEAR   Specific Gravity, Urine 1.010 1.005 - 1.030   pH 7.5 5.0 - 8.0   Glucose, UA NEGATIVE NEGATIVE mg/dL   Hgb urine dipstick NEGATIVE NEGATIVE   Bilirubin Urine  NEGATIVE NEGATIVE   Ketones, ur NEGATIVE NEGATIVE mg/dL   Protein, ur NEGATIVE NEGATIVE mg/dL   Nitrite NEGATIVE NEGATIVE   Leukocytes, UA NEGATIVE NEGATIVE  Pregnancy, urine POC     Status: Abnormal   Collection Time: 01/16/16 10:11 PM  Result Value Ref Range   Preg Test, Ur POSITIVE (A) NEGATIVE  Wet prep, genital     Status: Abnormal   Collection Time: 01/16/16 10:34 PM  Result Value Ref Range   Yeast Wet Prep HPF POC NONE SEEN NONE SEEN   Trich, Wet Prep NONE SEEN NONE SEEN   Clue Cells Wet Prep HPF POC NONE SEEN NONE SEEN   WBC, Wet Prep HPF POC MODERATE (A) NONE SEEN   Sperm NONE SEEN      MAU Course  Procedures None  MDM + UPT FHR - 150 bpm with doppler UA, wet prep, GC/Chlmaydia today  Assessment and Plan  A: SIUP at [redacted]w[redacted]d by LMP  Abdominal pain in pregnancy, second trimester  P: Discharge home Tylenol PRN for pain  Second trimetser precautions discussed GC/Chlmaydia pending  Patient advised to follow-up with [facility] as planned to start prenatal care Patient may return to MAU as needed or if her condition were to change or worsen   Marny Lowenstein, PA-C  01/16/2016, 11:00 PM

## 2016-01-17 LAB — GC/CHLAMYDIA PROBE AMP (~~LOC~~) NOT AT ARMC
Chlamydia: NEGATIVE
Neisseria Gonorrhea: NEGATIVE

## 2016-01-30 LAB — OB RESULTS CONSOLE HEPATITIS B SURFACE ANTIGEN: Hepatitis B Surface Ag: NEGATIVE

## 2016-01-30 LAB — OB RESULTS CONSOLE ABO/RH: RH Type: POSITIVE

## 2016-01-30 LAB — OB RESULTS CONSOLE RPR: RPR: NONREACTIVE

## 2016-01-30 LAB — OB RESULTS CONSOLE RUBELLA ANTIBODY, IGM: Rubella: IMMUNE

## 2016-01-30 LAB — OB RESULTS CONSOLE GC/CHLAMYDIA
Chlamydia: NEGATIVE
GC PROBE AMP, GENITAL: NEGATIVE

## 2016-01-30 LAB — OB RESULTS CONSOLE ANTIBODY SCREEN: ANTIBODY SCREEN: NEGATIVE

## 2016-01-30 LAB — OB RESULTS CONSOLE HIV ANTIBODY (ROUTINE TESTING): HIV: NONREACTIVE

## 2016-04-12 ENCOUNTER — Encounter (HOSPITAL_COMMUNITY): Payer: Self-pay | Admitting: Emergency Medicine

## 2016-04-12 ENCOUNTER — Emergency Department (HOSPITAL_COMMUNITY)
Admission: EM | Admit: 2016-04-12 | Discharge: 2016-04-12 | Disposition: A | Discharge status: Home / Self Care | Payer: Worker's Compensation | Attending: Emergency Medicine | Admitting: Emergency Medicine

## 2016-04-12 DIAGNOSIS — Y999 Unspecified external cause status: Secondary | ICD-10-CM | POA: Diagnosis not present

## 2016-04-12 DIAGNOSIS — Z3490 Encounter for supervision of normal pregnancy, unspecified, unspecified trimester: Secondary | ICD-10-CM

## 2016-04-12 DIAGNOSIS — Y929 Unspecified place or not applicable: Secondary | ICD-10-CM | POA: Insufficient documentation

## 2016-04-12 DIAGNOSIS — O9A213 Injury, poisoning and certain other consequences of external causes complicating pregnancy, third trimester: Secondary | ICD-10-CM | POA: Insufficient documentation

## 2016-04-12 DIAGNOSIS — Z3A20 20 weeks gestation of pregnancy: Secondary | ICD-10-CM | POA: Diagnosis not present

## 2016-04-12 DIAGNOSIS — Y939 Activity, unspecified: Secondary | ICD-10-CM | POA: Diagnosis not present

## 2016-04-12 DIAGNOSIS — R259 Unspecified abnormal involuntary movements: Secondary | ICD-10-CM | POA: Diagnosis not present

## 2016-04-12 DIAGNOSIS — W19XXXA Unspecified fall, initial encounter: Secondary | ICD-10-CM

## 2016-04-12 DIAGNOSIS — Z3A28 28 weeks gestation of pregnancy: Secondary | ICD-10-CM | POA: Diagnosis not present

## 2016-04-12 DIAGNOSIS — W010XXA Fall on same level from slipping, tripping and stumbling without subsequent striking against object, initial encounter: Secondary | ICD-10-CM | POA: Diagnosis not present

## 2016-04-12 NOTE — ED Notes (Signed)
OB Rapid Response RN at bedside. 

## 2016-04-12 NOTE — Progress Notes (Signed)
Spoke with Dr. Ernestina Penna. FHR tracing is a category 1. No uc's. Okay for pt to be dc'd home. EDMD notified.

## 2016-04-12 NOTE — ED Provider Notes (Signed)
28W with fall on her belly, mechanical. OB recommended monitoring, will be done at 4:45pm. Plan to dc if unremarkable at that time with fu with OB and strict return precautions.   5pm - the patient is stable for discharge per OB recommendations. Return for severe abdominal pain, lack of fetal movement, recurrent vomiting, passing out, vaginal bleeding or gush of fluid, or other concerning symptoms.. Patient verbalized understanding and agreement with plan. Discharged in good condition.   Sidney Ace, MD 04/12/16 6144  Richardean Canal, MD 04/12/16 [phone]

## 2016-04-12 NOTE — Discharge Instructions (Signed)
Ms. Alice Hays,  Nice meeting you! Please follow-up with your obstetrician. You may take Tylenol for any muscle soreness that may result from today's fall. Return to the emergency department if you develop chest pain, abdominal pain, lose consciousness, have new/worsening symptoms. Feel better soon and congrats!  S. Lane Hacker, PA-C

## 2016-04-12 NOTE — ED Notes (Signed)
Pt arrives from work via [employer] reporting fall, landed on abdomen.  Pt report being 28 weeks preg, EDD 07/01/16. Pt denies LOC, states had arms full and tripped on a pallet she couldn't see. Pt denies vag bleeding, denies cramping or pain.  No visible injuries noted at this time.

## 2016-04-12 NOTE — ED Provider Notes (Signed)
CSN: [id number]     Arrival date & time 04/12/16  1227 History   First MD Initiated Contact with Patient 04/12/16 1250     Chief Complaint  Patient presents with  . Fall   HPI  Alice Hays is a 35 y.o. [redacted] week pregnant female, EDD 07/01/16, presenting s/p mechanical fall. She states her arms were full of clothes that obstructed her vision, and she tripped over a pallet on the ground. She landed onto her abdomen. She denies any symptoms including vaginal bleeding/discharge/fluid leakage, abdominal pain, chest pain, headache, LOC, loss of bladder/bowel control.   Past Medical History  Diagnosis Date  . Ectopic pregnancy     L adnexa   Past Surgical History  Procedure Laterality Date  . Arm surgery     Family History  Problem Relation Age of Onset  . Cancer Mother   . Heart disease Mother   . Heart disease Father   . Cancer Maternal Aunt   . Diabetes Maternal Grandmother    Social History  Substance Use Topics  . Smoking status: Never Smoker   . Smokeless tobacco: Never Used  . Alcohol Use: No   OB History    Gravida Para Term Preterm AB TAB SAB Ectopic Multiple Living   Review of Systems  Ten systems are reviewed and are negative for acute change except as noted in the HPI  Allergies  Shellfish allergy and Iodine  Home Medications   Prior to Admission medications   Not on File   BP 113/81 mmHg  Pulse 86  Temp(Src) 98.2 F (36.8 C) (Oral)  Resp 18  Ht  (1.499 m)  Wt 90.266 kg  BMI 40.17 kg/m2  SpO2 98%  LMP 09/25/2015 Physical Exam  Constitutional: She is oriented to person, place, and time. She appears well-developed and well-nourished. No distress.  HENT:  Head: Normocephalic and atraumatic.  Mouth/Throat: Oropharynx is clear and moist. No oropharyngeal exudate.  Eyes: Conjunctivae are normal. Pupils are equal, round, and reactive to light. Right eye exhibits no discharge. Left eye exhibits no discharge. No scleral icterus.   Neck: Normal range of motion. No tracheal deviation present.  Cardiovascular: Normal rate, regular rhythm, normal heart sounds and intact distal pulses.  Exam reveals no gallop and no friction rub.   No murmur heard. Pulmonary/Chest: Effort normal and breath sounds normal. No respiratory distress. She has no wheezes. She has no rales. She exhibits no tenderness.  Abdominal: Soft. Bowel sounds are normal. She exhibits no distension. There is no tenderness. There is no rebound and no guarding.  Genitourinary:  Uterus consistent with 28 week pregnancy.  Musculoskeletal: She exhibits no edema.  No midline cervical, thoracic, lumbar spinal tenderness. Strength 5/5 throughout. NVI BL.   Lymphadenopathy:    She has no cervical adenopathy.  Neurological: She is alert and oriented to person, place, and time. Coordination normal.  Cranial nerves II through XII grossly intact  Skin: Skin is warm and dry. No rash noted. She is not diaphoretic. No erythema.  Psychiatric: She has a normal mood and affect. Her behavior is normal.  Nursing note and vitals reviewed.   ED Course  Procedures  MDM   Final diagnoses:  None   [redacted] week pregnant patient s/p mechanical fall that is currently asymptomatic. Unremarkable exam.  OB RN was advised to perform 4 hour fetal monitoring.  Shift change handoff to Sidney Ace, MD-  patient pending completion of 4 hour fetal monitoring. Should be completed by 16:45. OB RN will contact ED MD with discharge plan, most likely discharge.   Melton Krebs, PA-C 04/12/16 1618  Lavera Guise, MD 04/12/16 [phone]

## 2016-04-12 NOTE — Progress Notes (Signed)
Spoke with Dr. Ernestina Penna. Pt is a G2P0 at 28 4/[redacted] weeks gestation here today because she fell on her abd at work. No vaginal bleeding or leaking of fluid. FHR baseline 130BPM, min variability, 1 10x10 accel, no decels, no uc's. Pt denies any pain. Orders received for 4 hours of fetal monitoring.

## 2016-04-12 NOTE — Progress Notes (Signed)
Received call from [facility] staff at 1215. Pt is out coming in by EMS, 28 weeks, fell on her abd. OBRR here at 1238. Pt is 284/[redacted] weeks gestation says she fell on her abd at work. Denies vaginal bleeding or leaking of fluid. Denies any pain at this time. Says this pregnancy has been normal without complications. Says she gets her care at [facility] and Dr. Ernestina Penna is her MD. This is her 2nd pregnancy. The 1st pregnancy was a tubal pregnancy.

## 2016-05-28 LAB — OB RESULTS CONSOLE GBS: GBS: POSITIVE

## 2016-06-02 ENCOUNTER — Encounter (HOSPITAL_COMMUNITY): Payer: Self-pay | Admitting: *Deleted

## 2016-06-02 ENCOUNTER — Inpatient Hospital Stay (HOSPITAL_COMMUNITY)
Admission: AD | Admit: 2016-06-02 | Discharge: 2016-06-02 | Disposition: A | Discharge status: Home / Self Care | Payer: BLUE CROSS/BLUE SHIELD | Source: Ambulatory Visit | Attending: Obstetrics and Gynecology | Admitting: Obstetrics and Gynecology

## 2016-06-02 DIAGNOSIS — Z79899 Other long term (current) drug therapy: Secondary | ICD-10-CM | POA: Diagnosis not present

## 2016-06-02 DIAGNOSIS — G5603 Carpal tunnel syndrome, bilateral upper limbs: Secondary | ICD-10-CM | POA: Insufficient documentation

## 2016-06-02 DIAGNOSIS — O99353 Diseases of the nervous system complicating pregnancy, third trimester: Secondary | ICD-10-CM | POA: Diagnosis present

## 2016-06-02 DIAGNOSIS — Z3A35 35 weeks gestation of pregnancy: Secondary | ICD-10-CM | POA: Diagnosis not present

## 2016-06-02 LAB — URINALYSIS, ROUTINE W REFLEX MICROSCOPIC
BILIRUBIN URINE: NEGATIVE
Glucose, UA: NEGATIVE mg/dL
Hgb urine dipstick: NEGATIVE
Ketones, ur: NEGATIVE mg/dL
LEUKOCYTES UA: NEGATIVE
Nitrite: NEGATIVE
PH: 6 (ref 5.0–8.0)
PROTEIN: NEGATIVE mg/dL
Specific Gravity, Urine: 1.025 (ref 1.005–1.030)

## 2016-06-02 NOTE — MAU Note (Signed)
Awoke about 0500 and both arms numb. Not numb now but tingling in both arms. Happened last Sat but went away. Told her doctor this wk at office. Able to move and use arms and hands.

## 2016-06-02 NOTE — Progress Notes (Signed)
Dr Cherly Hensen notified of pt's admission and status. Aware of c/o of numbness and tingling in arms and hands, now just tingling in hands, ctx pattern, sl elevated B/Ps. No new orders. MD will see pt

## 2016-06-02 NOTE — Discharge Instructions (Signed)

## 2016-06-02 NOTE — MAU Provider Note (Signed)
History     Chief Complaint  Patient presents with  . hands numb  35 yo G2P0010 BF @ 35 6/[redacted] weeks gestation presents for evaluation of bilateral arm and hand numbness. Pt notes sx started when she woke up but now it is just her hand. (+) FM. Per records, pt had prior similar episode during this pregnancy and was told it was a pinched nerve.denies neck pain   OB History    Gravida Para Term Preterm AB Living   2       1 0   SAB TAB Ectopic Multiple Live Births       1          Past Medical History:  Diagnosis Date  . Ectopic pregnancy    L adnexa    Past Surgical History:  Procedure Laterality Date  . ARM SURGERY      Family History  Problem Relation Age of Onset  . Cancer Mother   . Heart disease Mother   . Heart disease Father   . Cancer Maternal Aunt   . Diabetes Maternal Grandmother     Social History  Substance Use Topics  . Smoking status: Never Smoker  . Smokeless tobacco: Never Used  . Alcohol use No    Allergies:  Allergies  Allergen Reactions  . Shellfish Allergy Anaphylaxis  . Iodine     Prescriptions Prior to Admission  Medication Sig Dispense Refill Last Dose  . ferrous sulfate 325 (65 FE) MG tablet Take 325 mg by mouth daily with breakfast.   06/01/2016 at Unknown time  . Prenatal Vit-Fe Fumarate-FA (PRENATAL MULTIVITAMIN) TABS tablet Take 1 tablet by mouth daily at 12 noon.   06/01/2016 at Unknown time   BP 116/81   Pulse 96   Temp 97.5 F (36.4 C)   Resp 18   Ht 4\' 11"  (1.499 m)   Wt 93.3 kg (205 lb 9.6 oz)   LMP 09/25/2015   SpO2 99%   BMI 41.53 kg/m    Physical Exam   Blood pressure 134/86, pulse 93, temperature 97.5 F (36.4 C), resp. rate 18, height 4\' 11"  (1.499 m), weight 93.3 kg (205 lb 9.6 oz), last menstrual period 09/25/2015, SpO2 99 %, unknown if currently breastfeeding.  General appearance: alert, cooperative and no distress Lungs: clear to auscultation bilaterally Abdomen: gravid nontender Pelvic: external genitalia  normal  Closed/thick/-4 firm Extremities: no edema, redness or tenderness in the calves or thighs Skin: Skin color, texture, turgor normal. No rashes or lesions   Tracing: baseline 125 (+) accels to 140 irreg ctx ED Course   Bilateral carpal tunnel syndrome PMC IUP @ 35 6/7 weeks P) d/c home. Advised to obtain bilateral splints. Keep sched ob appt  MDM   Sherise Geerdes A, MD 7:54 AM 06/02/2016

## 2016-06-13 ENCOUNTER — Telehealth (HOSPITAL_COMMUNITY): Payer: Self-pay | Admitting: *Deleted

## 2016-06-13 ENCOUNTER — Encounter (HOSPITAL_COMMUNITY): Payer: Self-pay | Admitting: *Deleted

## 2016-06-13 NOTE — Telephone Encounter (Signed)
Preadmission screen  

## 2016-06-18 ENCOUNTER — Inpatient Hospital Stay (HOSPITAL_COMMUNITY): Admission: RE | Admit: 2016-06-18 | Payer: BLUE CROSS/BLUE SHIELD | Source: Ambulatory Visit

## 2016-06-18 ENCOUNTER — Telehealth (HOSPITAL_COMMUNITY): Payer: Self-pay | Admitting: *Deleted

## 2016-06-18 NOTE — Telephone Encounter (Signed)
Preadmission screen  

## 2016-06-19 ENCOUNTER — Other Ambulatory Visit: Payer: Self-pay | Admitting: Obstetrics

## 2016-06-19 ENCOUNTER — Telehealth (HOSPITAL_COMMUNITY): Payer: Self-pay | Admitting: *Deleted

## 2016-06-19 ENCOUNTER — Encounter (HOSPITAL_COMMUNITY): Payer: Self-pay

## 2016-06-19 NOTE — Telephone Encounter (Signed)
Preadmission screen  

## 2016-06-26 ENCOUNTER — Encounter (HOSPITAL_COMMUNITY)
Admission: RE | Admit: 2016-06-26 | Discharge: 2016-06-26 | Disposition: A | Discharge status: Home / Self Care | Payer: BLUE CROSS/BLUE SHIELD | Source: Ambulatory Visit

## 2016-06-27 ENCOUNTER — Inpatient Hospital Stay (HOSPITAL_COMMUNITY): Admission: RE | Admit: 2016-06-27 | Payer: BLUE CROSS/BLUE SHIELD | Source: Ambulatory Visit | Admitting: Obstetrics

## 2016-06-27 ENCOUNTER — Encounter (HOSPITAL_COMMUNITY): Admission: RE | Payer: Self-pay | Source: Ambulatory Visit

## 2016-06-27 SURGERY — Surgical Case
Anesthesia: Spinal

## 2016-06-29 ENCOUNTER — Encounter (HOSPITAL_COMMUNITY)
Admission: AD | Disposition: A | Discharge status: Home / Self Care | Payer: Self-pay | Source: Ambulatory Visit | Attending: Obstetrics and Gynecology

## 2016-06-29 ENCOUNTER — Inpatient Hospital Stay (HOSPITAL_COMMUNITY): Payer: BLUE CROSS/BLUE SHIELD | Admitting: Anesthesiology

## 2016-06-29 ENCOUNTER — Inpatient Hospital Stay (HOSPITAL_COMMUNITY)
Admission: AD | Admit: 2016-06-29 | Discharge: 2016-07-02 | DRG: 765 | Disposition: A | Discharge status: Home / Self Care | Payer: BLUE CROSS/BLUE SHIELD | Source: Ambulatory Visit | Attending: Obstetrics and Gynecology | Admitting: Obstetrics and Gynecology

## 2016-06-29 ENCOUNTER — Encounter (HOSPITAL_COMMUNITY): Payer: Self-pay | Admitting: *Deleted

## 2016-06-29 DIAGNOSIS — O9081 Anemia of the puerperium: Secondary | ICD-10-CM | POA: Diagnosis not present

## 2016-06-29 DIAGNOSIS — O134 Gestational [pregnancy-induced] hypertension without significant proteinuria, complicating childbirth: Principal | ICD-10-CM | POA: Diagnosis present

## 2016-06-29 DIAGNOSIS — D62 Acute posthemorrhagic anemia: Secondary | ICD-10-CM | POA: Diagnosis not present

## 2016-06-29 DIAGNOSIS — O99214 Obesity complicating childbirth: Secondary | ICD-10-CM | POA: Diagnosis present

## 2016-06-29 DIAGNOSIS — Z823 Family history of stroke: Secondary | ICD-10-CM | POA: Diagnosis not present

## 2016-06-29 DIAGNOSIS — Z6841 Body Mass Index (BMI) 40.0 and over, adult: Secondary | ICD-10-CM | POA: Diagnosis not present

## 2016-06-29 DIAGNOSIS — Z8249 Family history of ischemic heart disease and other diseases of the circulatory system: Secondary | ICD-10-CM

## 2016-06-29 DIAGNOSIS — Z833 Family history of diabetes mellitus: Secondary | ICD-10-CM | POA: Diagnosis not present

## 2016-06-29 DIAGNOSIS — O99824 Streptococcus B carrier state complicating childbirth: Secondary | ICD-10-CM | POA: Diagnosis present

## 2016-06-29 DIAGNOSIS — Z3403 Encounter for supervision of normal first pregnancy, third trimester: Secondary | ICD-10-CM | POA: Diagnosis present

## 2016-06-29 DIAGNOSIS — Z3A39 39 weeks gestation of pregnancy: Secondary | ICD-10-CM | POA: Diagnosis not present

## 2016-06-29 LAB — CBC
HCT: 36 % (ref 36.0–46.0)
Hemoglobin: 12.3 g/dL (ref 12.0–15.0)
MCH: 30.4 pg (ref 26.0–34.0)
MCHC: 34.2 g/dL (ref 30.0–36.0)
MCV: 89.1 fL (ref 78.0–100.0)
PLATELETS: 230 10*3/uL (ref 150–400)
RBC: 4.04 MIL/uL (ref 3.87–5.11)
RDW: 13.8 % (ref 11.5–15.5)
WBC: 8 10*3/uL (ref 4.0–10.5)

## 2016-06-29 LAB — COMPREHENSIVE METABOLIC PANEL
ALBUMIN: 2.8 g/dL — AB (ref 3.5–5.0)
ALT: 15 U/L (ref 14–54)
AST: 17 U/L (ref 15–41)
Alkaline Phosphatase: 86 U/L (ref 38–126)
Anion gap: 9 (ref 5–15)
BUN: 9 mg/dL (ref 6–20)
CALCIUM: 8.9 mg/dL (ref 8.9–10.3)
CO2: 19 mmol/L — ABNORMAL LOW (ref 22–32)
CREATININE: 0.57 mg/dL (ref 0.44–1.00)
Chloride: 106 mmol/L (ref 101–111)
Glucose, Bld: 113 mg/dL — ABNORMAL HIGH (ref 65–99)
Potassium: 3.7 mmol/L (ref 3.5–5.1)
SODIUM: 134 mmol/L — AB (ref 135–145)
Total Bilirubin: 0.3 mg/dL (ref 0.3–1.2)
Total Protein: 6.8 g/dL (ref 6.5–8.1)

## 2016-06-29 LAB — PROTEIN / CREATININE RATIO, URINE
CREATININE, URINE: 249 mg/dL
Protein Creatinine Ratio: 0.17 mg/mg{Cre} — ABNORMAL HIGH (ref 0.00–0.15)
TOTAL PROTEIN, URINE: 43 mg/dL

## 2016-06-29 LAB — ABO/RH: ABO/RH(D): O POS

## 2016-06-29 LAB — LACTATE DEHYDROGENASE: LDH: 109 U/L (ref 98–192)

## 2016-06-29 LAB — TYPE AND SCREEN
ABO/RH(D): O POS
ANTIBODY SCREEN: NEGATIVE

## 2016-06-29 LAB — URIC ACID: Uric Acid, Serum: 4.4 mg/dL (ref 2.3–6.6)

## 2016-06-29 SURGERY — Surgical Case
Anesthesia: Epidural

## 2016-06-29 MED ORDER — NALOXONE HCL 0.4 MG/ML IJ SOLN: 0.4000 mg | INTRAMUSCULAR | Status: DC | PRN

## 2016-06-29 MED ORDER — OXYCODONE-ACETAMINOPHEN 5-325 MG PO TABS
1.0000 | ORAL_TABLET | ORAL | Status: DC | PRN
  Administered 2016-07-01: 1 via ORAL
  Filled 2016-06-29: qty 1

## 2016-06-29 MED ORDER — COCONUT OIL OIL: 1.0000 "application " | TOPICAL_OIL | Status: DC | PRN

## 2016-06-29 MED ORDER — LACTATED RINGERS IV SOLN: 500.0000 mL | Freq: Once | INTRAVENOUS | Status: DC

## 2016-06-29 MED ORDER — MORPHINE SULFATE (PF) 10 MG/ML IV SOLN: INTRAVENOUS | Status: DC | PRN

## 2016-06-29 MED ORDER — OXYCODONE-ACETAMINOPHEN 5-325 MG PO TABS: 2.0000 | ORAL_TABLET | ORAL | Status: DC | PRN

## 2016-06-29 MED ORDER — OXYTOCIN 40 UNITS IN LACTATED RINGERS INFUSION - SIMPLE MED
1.0000 m[IU]/min | INTRAVENOUS | Status: DC
  Administered 2016-06-29: 2 m[IU]/min via INTRAVENOUS

## 2016-06-29 MED ORDER — FENTANYL 2.5 MCG/ML BUPIVACAINE 1/10 % EPIDURAL INFUSION (WH - ANES)
14.0000 mL/h | INTRAMUSCULAR | Status: DC | PRN
  Administered 2016-06-29: 14 mL/h via EPIDURAL
  Filled 2016-06-29: qty 125

## 2016-06-29 MED ORDER — NALBUPHINE HCL 10 MG/ML IJ SOLN: 5.0000 mg | Freq: Once | INTRAMUSCULAR | Status: DC | PRN

## 2016-06-29 MED ORDER — OXYTOCIN 10 UNIT/ML IJ SOLN: 10.0000 [IU] | Freq: Once | INTRAMUSCULAR | Status: DC

## 2016-06-29 MED ORDER — PRENATAL MULTIVITAMIN CH
1.0000 | ORAL_TABLET | Freq: Every day | ORAL | Status: DC
  Administered 2016-06-30 – 2016-07-02 (×3): 1 via ORAL
  Filled 2016-06-29 (×3): qty 1

## 2016-06-29 MED ORDER — PENICILLIN G POTASSIUM 5000000 UNITS IJ SOLR
5.0000 10*6.[IU] | Freq: Once | INTRAVENOUS | Status: AC
  Administered 2016-06-29: 5 10*6.[IU] via INTRAVENOUS
  Filled 2016-06-29: qty 5

## 2016-06-29 MED ORDER — LACTATED RINGERS IV SOLN
INTRAVENOUS | Status: DC | PRN
  Administered 2016-06-29: 15:00:00 via INTRAVENOUS

## 2016-06-29 MED ORDER — SODIUM CHLORIDE 0.9% FLUSH: 3.0000 mL | INTRAVENOUS | Status: DC | PRN

## 2016-06-29 MED ORDER — MENTHOL 3 MG MT LOZG: 1.0000 | LOZENGE | OROMUCOSAL | Status: DC | PRN

## 2016-06-29 MED ORDER — MEPERIDINE HCL 25 MG/ML IJ SOLN: 6.2500 mg | INTRAMUSCULAR | Status: DC | PRN

## 2016-06-29 MED ORDER — CEFAZOLIN SODIUM-DEXTROSE 2-3 GM-% IV SOLR
INTRAVENOUS | Status: DC | PRN
  Administered 2016-06-29: 2 g via INTRAVENOUS

## 2016-06-29 MED ORDER — TERBUTALINE SULFATE 1 MG/ML IJ SOLN
0.2500 mg | Freq: Once | INTRAMUSCULAR | Status: AC | PRN
  Administered 2016-06-29: 0.25 mg via SUBCUTANEOUS
  Filled 2016-06-29 (×2): qty 1

## 2016-06-29 MED ORDER — DIPHENHYDRAMINE HCL 50 MG/ML IJ SOLN: 12.5000 mg | INTRAMUSCULAR | Status: DC | PRN

## 2016-06-29 MED ORDER — ZOLPIDEM TARTRATE 5 MG PO TABS: 5.0000 mg | ORAL_TABLET | Freq: Every evening | ORAL | Status: DC | PRN

## 2016-06-29 MED ORDER — OXYTOCIN BOLUS FROM INFUSION: 500.0000 mL | Freq: Once | INTRAVENOUS | Status: DC

## 2016-06-29 MED ORDER — SODIUM BICARBONATE 8.4 % IV SOLN
INTRAVENOUS | Status: DC | PRN
  Administered 2016-06-29: 19 mL via EPIDURAL

## 2016-06-29 MED ORDER — LACTATED RINGERS IV SOLN
INTRAVENOUS | Status: DC | PRN
  Administered 2016-06-29 (×3): via INTRAVENOUS

## 2016-06-29 MED ORDER — OXYTOCIN 40 UNITS IN LACTATED RINGERS INFUSION - SIMPLE MED
2.5000 [IU]/h | INTRAVENOUS | Status: DC
  Filled 2016-06-29: qty 1000

## 2016-06-29 MED ORDER — LACTATED RINGERS IV SOLN
500.0000 mL | Freq: Once | INTRAVENOUS | Status: AC
  Administered 2016-06-29: 500 mL via INTRAVENOUS

## 2016-06-29 MED ORDER — FENTANYL CITRATE (PF) 100 MCG/2ML IJ SOLN: 25.0000 ug | INTRAMUSCULAR | Status: DC | PRN

## 2016-06-29 MED ORDER — BUPIVACAINE HCL (PF) 0.25 % IJ SOLN
INTRAMUSCULAR | Status: AC
  Filled 2016-06-29: qty 10

## 2016-06-29 MED ORDER — IBUPROFEN 600 MG PO TABS
600.0000 mg | ORAL_TABLET | Freq: Four times a day (QID) | ORAL | Status: DC
  Administered 2016-06-29 – 2016-06-30 (×2): 600 mg via ORAL
  Filled 2016-06-29 (×2): qty 1

## 2016-06-29 MED ORDER — LACTATED RINGERS IV SOLN: 500.0000 mL | INTRAVENOUS | Status: DC | PRN

## 2016-06-29 MED ORDER — CYCLOBENZAPRINE HCL 10 MG PO TABS
10.0000 mg | ORAL_TABLET | Freq: Once | ORAL | Status: AC
  Administered 2016-06-29: 10 mg via ORAL
  Filled 2016-06-29: qty 1

## 2016-06-29 MED ORDER — MORPHINE SULFATE (PF) 0.5 MG/ML IJ SOLN
INTRAMUSCULAR | Status: DC | PRN
  Administered 2016-06-29: 1 mg via EPIDURAL

## 2016-06-29 MED ORDER — SENNOSIDES-DOCUSATE SODIUM 8.6-50 MG PO TABS
2.0000 | ORAL_TABLET | ORAL | Status: DC
  Administered 2016-06-29 – 2016-07-01 (×3): 2 via ORAL
  Filled 2016-06-29 (×3): qty 2

## 2016-06-29 MED ORDER — KETOROLAC TROMETHAMINE 30 MG/ML IJ SOLN: 30.0000 mg | Freq: Four times a day (QID) | INTRAMUSCULAR | Status: DC | PRN

## 2016-06-29 MED ORDER — SODIUM CHLORIDE 0.9 % IR SOLN
Status: DC | PRN
  Administered 2016-06-29: 1000 mL

## 2016-06-29 MED ORDER — NALBUPHINE HCL 10 MG/ML IJ SOLN: 5.0000 mg | INTRAMUSCULAR | Status: DC | PRN

## 2016-06-29 MED ORDER — DIBUCAINE 1 % RE OINT: 1.0000 "application " | TOPICAL_OINTMENT | RECTAL | Status: DC | PRN

## 2016-06-29 MED ORDER — NALOXONE HCL 2 MG/2ML IJ SOSY: 1.0000 ug/kg/h | PREFILLED_SYRINGE | INTRAVENOUS | Status: DC | PRN

## 2016-06-29 MED ORDER — KETAMINE HCL 10 MG/ML IJ SOLN
INTRAMUSCULAR | Status: DC | PRN
  Administered 2016-06-29: 20 mg via INTRAVENOUS

## 2016-06-29 MED ORDER — EPHEDRINE 5 MG/ML INJ: 10.0000 mg | INTRAVENOUS | Status: DC | PRN

## 2016-06-29 MED ORDER — SIMETHICONE 80 MG PO CHEW
80.0000 mg | CHEWABLE_TABLET | Freq: Three times a day (TID) | ORAL | Status: DC
  Administered 2016-06-30 – 2016-07-02 (×6): 80 mg via ORAL
  Filled 2016-06-29 (×6): qty 1

## 2016-06-29 MED ORDER — OXYTOCIN 40 UNITS IN LACTATED RINGERS INFUSION - SIMPLE MED: 2.5000 [IU]/h | INTRAVENOUS | Status: DC

## 2016-06-29 MED ORDER — LIDOCAINE HCL (PF) 1 % IJ SOLN
INTRAMUSCULAR | Status: DC | PRN
  Administered 2016-06-29 (×2): 6 mL

## 2016-06-29 MED ORDER — DIPHENHYDRAMINE HCL 25 MG PO CAPS: 25.0000 mg | ORAL_CAPSULE | ORAL | Status: DC | PRN

## 2016-06-29 MED ORDER — PHENYLEPHRINE 40 MCG/ML (10ML) SYRINGE FOR IV PUSH (FOR BLOOD PRESSURE SUPPORT)
80.0000 ug | PREFILLED_SYRINGE | INTRAVENOUS | Status: DC | PRN
  Administered 2016-06-29: 80 ug via INTRAVENOUS

## 2016-06-29 MED ORDER — LIDOCAINE HCL (PF) 1 % IJ SOLN: 30.0000 mL | INTRAMUSCULAR | Status: DC | PRN

## 2016-06-29 MED ORDER — SCOPOLAMINE 1 MG/3DAYS TD PT72
MEDICATED_PATCH | TRANSDERMAL | Status: DC | PRN
  Administered 2016-06-29: 1 via TRANSDERMAL

## 2016-06-29 MED ORDER — ACETAMINOPHEN 325 MG PO TABS
650.0000 mg | ORAL_TABLET | ORAL | Status: DC | PRN
  Administered 2016-06-29: 650 mg via ORAL
  Filled 2016-06-29: qty 2

## 2016-06-29 MED ORDER — SOD CITRATE-CITRIC ACID 500-334 MG/5ML PO SOLN
30.0000 mL | ORAL | Status: DC | PRN
  Administered 2016-06-29: 30 mL via ORAL
  Filled 2016-06-29: qty 15

## 2016-06-29 MED ORDER — LACTATED RINGERS IV SOLN: INTRAVENOUS | Status: DC

## 2016-06-29 MED ORDER — DIPHENHYDRAMINE HCL 25 MG PO CAPS: 25.0000 mg | ORAL_CAPSULE | Freq: Four times a day (QID) | ORAL | Status: DC | PRN

## 2016-06-29 MED ORDER — SIMETHICONE 80 MG PO CHEW: 80.0000 mg | CHEWABLE_TABLET | ORAL | Status: DC | PRN

## 2016-06-29 MED ORDER — SCOPOLAMINE 1 MG/3DAYS TD PT72: 1.0000 | MEDICATED_PATCH | Freq: Once | TRANSDERMAL | Status: DC

## 2016-06-29 MED ORDER — PHENYLEPHRINE 40 MCG/ML (10ML) SYRINGE FOR IV PUSH (FOR BLOOD PRESSURE SUPPORT)
80.0000 ug | PREFILLED_SYRINGE | INTRAVENOUS | Status: DC | PRN
  Filled 2016-06-29 (×2): qty 10

## 2016-06-29 MED ORDER — DEXAMETHASONE SODIUM PHOSPHATE 10 MG/ML IJ SOLN
INTRAMUSCULAR | Status: DC | PRN
  Administered 2016-06-29: 10 mg via INTRAVENOUS

## 2016-06-29 MED ORDER — LACTATED RINGERS IV SOLN
INTRAVENOUS | Status: DC
  Administered 2016-06-29: 14:00:00 via INTRAUTERINE

## 2016-06-29 MED ORDER — ONDANSETRON HCL 4 MG/2ML IJ SOLN
INTRAMUSCULAR | Status: DC | PRN
  Administered 2016-06-29: 4 mg via INTRAVENOUS

## 2016-06-29 MED ORDER — PENICILLIN G POTASSIUM 5000000 UNITS IJ SOLR
2.5000 10*6.[IU] | INTRAVENOUS | Status: DC
  Filled 2016-06-29 (×4): qty 2.5

## 2016-06-29 MED ORDER — FENTANYL CITRATE (PF) 100 MCG/2ML IJ SOLN
INTRAMUSCULAR | Status: DC | PRN
  Administered 2016-06-29: 25 ug via INTRAVENOUS

## 2016-06-29 MED ORDER — OXYTOCIN 10 UNIT/ML IJ SOLN
INTRAVENOUS | Status: DC | PRN
  Administered 2016-06-29: 40 [IU] via INTRAVENOUS

## 2016-06-29 MED ORDER — PHENYLEPHRINE 40 MCG/ML (10ML) SYRINGE FOR IV PUSH (FOR BLOOD PRESSURE SUPPORT)
100.0000 ug | PREFILLED_SYRINGE | Freq: Once | INTRAVENOUS | Status: AC
  Administered 2016-06-29: 40 ug via INTRAVENOUS
  Filled 2016-06-29: qty 5

## 2016-06-29 MED ORDER — PROMETHAZINE HCL 25 MG/ML IJ SOLN: 6.2500 mg | INTRAMUSCULAR | Status: DC | PRN

## 2016-06-29 MED ORDER — ONDANSETRON HCL 4 MG/2ML IJ SOLN: 4.0000 mg | Freq: Four times a day (QID) | INTRAMUSCULAR | Status: DC | PRN

## 2016-06-29 MED ORDER — BUPIVACAINE HCL (PF) 0.25 % IJ SOLN
INTRAMUSCULAR | Status: DC | PRN
  Administered 2016-06-29: 10 mL

## 2016-06-29 MED ORDER — LACTATED RINGERS IV SOLN
INTRAVENOUS | Status: DC
  Administered 2016-06-29: 10:00:00 via INTRAVENOUS

## 2016-06-29 MED ORDER — OXYCODONE-ACETAMINOPHEN 5-325 MG PO TABS: 1.0000 | ORAL_TABLET | ORAL | Status: DC | PRN

## 2016-06-29 MED ORDER — SIMETHICONE 80 MG PO CHEW
80.0000 mg | CHEWABLE_TABLET | ORAL | Status: DC
  Administered 2016-06-29 – 2016-07-01 (×3): 80 mg via ORAL
  Filled 2016-06-29 (×3): qty 1

## 2016-06-29 MED ORDER — ONDANSETRON HCL 4 MG/2ML IJ SOLN: 4.0000 mg | Freq: Three times a day (TID) | INTRAMUSCULAR | Status: DC | PRN

## 2016-06-29 MED ORDER — WITCH HAZEL-GLYCERIN EX PADS: 1.0000 "application " | MEDICATED_PAD | CUTANEOUS | Status: DC | PRN

## 2016-06-29 SURGICAL SUPPLY — 46 items
BARRIER ADHS 3X4 INTERCEED (GAUZE/BANDAGES/DRESSINGS) ×3 IMPLANT
BENZOIN TINCTURE PRP APPL 2/3 (GAUZE/BANDAGES/DRESSINGS) IMPLANT
CHLORAPREP W/TINT 26ML (MISCELLANEOUS) ×3 IMPLANT
CLAMP CORD UMBIL (MISCELLANEOUS) IMPLANT
CLOSURE WOUND 1/2 X4 (GAUZE/BANDAGES/DRESSINGS)
CLOTH BEACON ORANGE TIMEOUT ST (SAFETY) ×3 IMPLANT
CONTAINER PREFILL 10% NBF 15ML (MISCELLANEOUS) IMPLANT
DRAPE C SECTION CLR SCREEN (DRAPES) ×3 IMPLANT
DRSG OPSITE POSTOP 4X10 (GAUZE/BANDAGES/DRESSINGS) ×3 IMPLANT
ELECT REM PT RETURN 9FT ADLT (ELECTROSURGICAL) ×3
ELECTRODE REM PT RTRN 9FT ADLT (ELECTROSURGICAL) ×1 IMPLANT
EXTRACTOR VACUUM M CUP 4 TUBE (SUCTIONS) IMPLANT
EXTRACTOR VACUUM M CUP 4' TUBE (SUCTIONS)
GLOVE BIOGEL PI IND STRL 7.0 (GLOVE) ×2 IMPLANT
GLOVE BIOGEL PI INDICATOR 7.0 (GLOVE) ×4
GLOVE ECLIPSE 6.5 STRL STRAW (GLOVE) ×3 IMPLANT
GOWN STRL REUS W/TWL LRG LVL3 (GOWN DISPOSABLE) ×6 IMPLANT
KIT ABG SYR 3ML LUER SLIP (SYRINGE) IMPLANT
NEEDLE HYPO 22GX1.5 SAFETY (NEEDLE) ×3 IMPLANT
NEEDLE HYPO 25X5/8 SAFETYGLIDE (NEEDLE) IMPLANT
NS IRRIG 1000ML POUR BTL (IV SOLUTION) ×3 IMPLANT
PACK C SECTION WH (CUSTOM PROCEDURE TRAY) ×3 IMPLANT
PAD ABD 7.5X8 STRL (GAUZE/BANDAGES/DRESSINGS) ×3 IMPLANT
PAD OB MATERNITY 4.3X12.25 (PERSONAL CARE ITEMS) ×3 IMPLANT
RTRCTR C-SECT PINK 25CM LRG (MISCELLANEOUS) IMPLANT
SPONGE GAUZE 4X4 12PLY (GAUZE/BANDAGES/DRESSINGS) ×6 IMPLANT
STAPLER VISISTAT 35W (STAPLE) ×3 IMPLANT
STRIP CLOSURE SKIN 1/2X4 (GAUZE/BANDAGES/DRESSINGS) IMPLANT
SUT CHROMIC GUT AB #0 18 (SUTURE) IMPLANT
SUT MNCRL 0 VIOLET CTX 36 (SUTURE) ×3 IMPLANT
SUT MON AB 2-0 SH 27 (SUTURE)
SUT MON AB 2-0 SH27 (SUTURE) IMPLANT
SUT MON AB 3-0 SH 27 (SUTURE)
SUT MON AB 3-0 SH27 (SUTURE) IMPLANT
SUT MON AB 4-0 PS1 27 (SUTURE) IMPLANT
SUT MONOCRYL 0 CTX 36 (SUTURE) ×6
SUT PLAIN 2 0 (SUTURE)
SUT PLAIN 2 0 XLH (SUTURE) IMPLANT
SUT PLAIN ABS 2-0 CT1 27XMFL (SUTURE) IMPLANT
SUT VIC AB 0 CT1 36 (SUTURE) ×6 IMPLANT
SUT VIC AB 2-0 CT1 27 (SUTURE) ×2
SUT VIC AB 2-0 CT1 TAPERPNT 27 (SUTURE) ×1 IMPLANT
SUT VIC AB 4-0 PS2 27 (SUTURE) IMPLANT
SYR CONTROL 10ML LL (SYRINGE) ×3 IMPLANT
TOWEL OR 17X24 6PK STRL BLUE (TOWEL DISPOSABLE) ×3 IMPLANT
TRAY FOLEY CATH SILVER 14FR (SET/KITS/TRAYS/PACK) IMPLANT

## 2016-06-29 NOTE — Brief Op Note (Signed)
06/29/2016  3:40 PM  PATIENT:  Lillie Columbia  35 y.o. female  PRE-OPERATIVE DIAGNOSIS:  Non-reassuring fetal heart tracing  POST-OPERATIVE DIAGNOSIS:  Non-reassuring fetal heart tracing  PROCEDURE:  Emergency Primary cesarean section, kerr hysterotomy  SURGEON:  Surgeon(s) and Role:    * Maxie Better, MD - Primary    * Myna Hidalgo, DO - Assisting  PHYSICIAN ASSISTANT:   ASSISTANTS: Myna Hidalgo OD Marlinda Mike, CNM   ANESTHESIA:   epidural FINDINGS;  Live  Female OP,  Meconium fluid, nl tubes and ovaries. Cord ph pending. Apgar 9/10 EBL:  Total I/O In: 3300 [I.V.:3300] Out: 1050 [Urine:400; Blood:650]  BLOOD ADMINISTERED:none  DRAINS: none   LOCAL MEDICATIONS USED:  MARCAINE     SPECIMEN:  Source of Specimen:  placenta  DISPOSITION OF SPECIMEN:  PATHOLOGY  COUNTS:  YES  TOURNIQUET:  * No tourniquets in log *  DICTATION: .Other Dictation: Dictation Number [id number]  PLAN OF CARE: Admit to inpatient   PATIENT DISPOSITION:  PACU - hemodynamically stable.   Delay start of Pharmacological VTE agent (>24hrs) due to surgical blood loss or risk of bleeding: no

## 2016-06-29 NOTE — Anesthesia Pain Management Evaluation Note (Signed)
  CRNA Pain Management Visit Note  Patient: Alice Hays, 35 y.o., female  "Hello I am a member of the anesthesia team at [facility]. We have an anesthesia team available at all times to provide care throughout the hospital, including epidural management and anesthesia for C-section. I don't know your plan for the delivery whether it a natural birth, water birth, IV sedation, nitrous supplementation, doula or epidural, but we want to meet your pain goals."   1.Was your pain managed to your expectations on prior hospitalizations?     2.What is your expectation for pain management during this hospitalization?    3.How can we help you reach that goal?   Record the patient's initial score and the patient's pain goal.   Pain: 0  Pain Goal: 5 [facility] wants you to be able to say your pain was always managed very well.  Laban Emperor 06/29/2016

## 2016-06-29 NOTE — Progress Notes (Signed)
Came in to see status of pt W/r to labor mgmt On arrival in the room, pt is rubbing back of head and squinting. Pt admits to having a bad headache which started after epidural Placement. Pt was given tylenol and the anesthesiologist had already been in regarding this issue. Per RN, he thought it was not related to the epidural. Pt was laid flat for exam which was 6/90/-3 bulging membrane, deviated to the mat right. Pt started crying and complaining of h/a worse with lying down. Anesthesia was called back for reassessment. In the interim, AROM performed. Thick meconium noted. IUPC placed for planned amnioinfusion. ISE placement x 2 without success and this deferred. Abdominal binder placed.  Dr Junita Push , anesthesiologist arrived. Pt placed back in the upright  Position.  He assess pt. With  Pressure and rubbing back of head, headache would transiently improve thus possible etiology for h/a: muscle spasm disc and I ordered flexeril to see if h./ a would respond. Concern expressed to anesthesia that given her h/a, if she needed to have C/S this h/a will be a big problem. Amnioinfusion started. BP was not cause of h/a and labs were not c/w toxemia. Pharmacy called to get the flexeril so that we could assess if spasm or will need to look further into this new onset h/a. All agree this was not making any sense( ? Etiology for h/a).  Pt given flexeril. Await response. Pitocin ordered to augment labor

## 2016-06-29 NOTE — H&P (Signed)
Alice Hays is a 35 y.o. female presenting at term in labor. Intact membrane. New onset elevated BP . Denies associated preeclampsia warning signs. (+) leg edema. OB History    Gravida Para Term Preterm AB Living   2       1 0   SAB TAB Ectopic Multiple Live Births       1         Past Medical History:  Diagnosis Date  . Anemia   . Ectopic pregnancy    L adnexa  . Hx of varicella    Past Surgical History:  Procedure Laterality Date  . ARM SURGERY     Family History: family history includes Cancer in her maternal aunt and mother; Diabetes in her maternal grandmother; Heart disease in her father and mother; Hypertension in her father; Stroke in her father. Social History:  reports that she has never smoked. She has never used smokeless tobacco. She reports that she does not drink alcohol or use drugs.     Maternal Diabetes: No Genetic Screening: Normal Maternal Ultrasounds/Referrals: Normal Fetal Ultrasounds or other Referrals:  None Maternal Substance Abuse:  No Significant Maternal Medications:  None Significant Maternal Lab Results:  Lab values include: Group B Strep positive Other Comments:  None  Review of Systems  Eyes: Negative for blurred vision and double vision.  Gastrointestinal: Negative for heartburn.   Maternal Medical History:  Reason for admission: Contractions.   Contractions: Frequency: regular.   Perceived severity is strong.    Fetal activity: Perceived fetal activity is normal.    Prenatal complications: no prenatal complications Prenatal Complications - Diabetes: none.    Dilation: 3.5 Effacement (%): 80 Station: -3 Exam by:: Dr. Cherly Hensenousins Blood pressure 149/93, pulse 95, temperature 97.5 F (36.4 C), temperature source Oral, resp. rate 18, height 4\' 11"  (1.499 m), weight 92.8 kg (204 lb 9.6 oz), last menstrual period 09/25/2015, unknown if currently breastfeeding. Exam Physical Exam  Constitutional: She is oriented to person, place,  and time. She appears well-developed and well-nourished.  HENT:  Head: Atraumatic.  Eyes: EOM are normal.  Neck: Neck supple.  Cardiovascular: Regular rhythm.   GI: Soft.  Musculoskeletal: She exhibits edema.  Neurological: She is alert and oriented to person, place, and time.  Skin: Skin is warm and dry.  Psychiatric: She has a normal mood and affect.    Prenatal labs: ABO, Rh: O/Positive/-- (04/04 0000) Antibody: Negative (04/04 0000) Rubella: Immune (04/04 0000) RPR: Nonreactive (04/04 0000)  HBsAg: Negative (04/04 0000)  HIV: Non-reactive (04/04 0000)  GBS: Positive (08/01 0000)   CBC    Component Value Date/Time   WBC 8.0 06/29/2016 0852   RBC 4.04 06/29/2016 0852   HGB 12.3 06/29/2016 0852   HCT 36.0 06/29/2016 0852   PLT 230 06/29/2016 0852   MCV 89.1 06/29/2016 0852   MCH 30.4 06/29/2016 0852   MCHC 34.2 06/29/2016 0852   RDW 13.8 06/29/2016 0852   LYMPHSABS 2.8 02/25/2014 1534   MONOABS 0.5 02/25/2014 1534   EOSABS 0.8 (H) 02/25/2014 1534   BASOSABS 0.0 02/25/2014 1534   CMP Latest Ref Rng & Units 06/29/2016 02/25/2014 11/29/2012  Glucose 65 - 99 mg/dL 161(W113(H) 95 -  BUN 6 - 20 mg/dL 9 11 10   Creatinine 0.44 - 1.00 mg/dL 9.600.57 4.540.59 0.980.56  Sodium 135 - 145 mmol/L 134(L) 138 -  Potassium 3.5 - 5.1 mmol/L 3.7 3.9 -  Chloride 101 - 111 mmol/L 106 100 -  CO2 22 - 32  mmol/L 19(L) 24 -  Calcium 8.9 - 10.3 mg/dL 8.9 9.3 -  Total Protein 6.5 - 8.1 g/dL 6.8 7.1 -  Total Bilirubin 0.3 - 1.2 mg/dL 0.3 <3.6(I) -  Alkaline Phos 38 - 126 U/L 86 68 -  AST 15 - 41 U/L 17 17 14   ALT 14 - 54 U/L 15 12 -    Assessment/Plan: Early labor GBS cx positive Gestational HTN  P0 admit . Routine labor labs. BP med prn. Epidural. Pitocin augmentation prn Alice Hays A 06/29/2016, 9:47 AM

## 2016-06-29 NOTE — Anesthesia Preprocedure Evaluation (Addendum)
Anesthesia Evaluation  Patient identified by MRN, date of birth, ID band Patient awake    Reviewed: Allergy & Precautions, NPO status , Patient's Chart, lab work & pertinent test results  Airway Mallampati: II  TM Distance: >3 FB Neck ROM: Full    Dental no notable dental hx.    Pulmonary neg pulmonary ROS,    Pulmonary exam normal breath sounds clear to auscultation       Cardiovascular negative cardio ROS Normal cardiovascular exam Rhythm:Regular Rate:Normal     Neuro/Psych negative neurological ROS  negative psych ROS   GI/Hepatic negative GI ROS, Neg liver ROS,   Endo/Other  Morbid obesity  Renal/GU negative Renal ROS  negative genitourinary   Musculoskeletal negative musculoskeletal ROS (+)   Abdominal (+) + obese,   Peds negative pediatric ROS (+)  Hematology  (+) anemia ,   Anesthesia Other Findings   Reproductive/Obstetrics negative OB ROS                             Anesthesia Physical Anesthesia Plan  ASA: III  Anesthesia Plan: Epidural   Post-op Pain Management:    Induction: Intravenous  Airway Management Planned: Natural Airway  Additional Equipment:   Intra-op Plan:   Post-operative Plan:   Informed Consent: I have reviewed the patients History and Physical, chart, labs and discussed the procedure including the risks, benefits and alternatives for the proposed anesthesia with the patient or authorized representative who has indicated his/her understanding and acceptance.   Dental advisory given  Plan Discussed with: CRNA  Anesthesia Plan Comments: (Informed consent obtained prior to proceeding including risk of failure, 1% risk of PDPH, risk of minor discomfort and bruising.  Discussed rare but serious complications including epidural abscess, permanent nerve injury, epidural hematoma.  Discussed alternatives to epidural analgesia and patient desires to  proceed.  Timeout performed pre-procedure verifying patient name, procedure, and platelet count.  Patient tolerated procedure well.  Urgent C/S called about 1435 for decreased FHR. Epidural has been working well. Headache is reportedly better after flexeril.)       Anesthesia Quick Evaluation

## 2016-06-29 NOTE — Transfer of Care (Signed)
Immediate Anesthesia Transfer of Care Note  Patient: Alice Hays  Procedure(s) Performed: Procedure(s): CESAREAN SECTION (N/A)  Patient Location: PACU  Anesthesia Type:Epidural  Level of Consciousness: awake, alert  and oriented  Airway & Oxygen Therapy: Patient Spontanous Breathing and Patient connected to nasal cannula oxygen  Post-op Assessment: Report given to RN and Post -op Vital signs reviewed and stable  Post vital signs: Reviewed and stable  Last Vitals:  Vitals:   06/29/16 1432 06/29/16 1433  BP: (!) 96/47 (!) 100/56  Pulse: 80 82  Resp: 16 16  Temp:      Last Pain:  Vitals:   06/29/16 1535  TempSrc:   PainSc: (P) 0-No pain      Patients Stated Pain Goal: 2 (06/29/16 1013)  Complications: No apparent anesthesia complications

## 2016-06-29 NOTE — Progress Notes (Signed)
Late entry.   Pt noted to be having fetal heart rate deceleration. Staff and myself quickly entered room.  Pt notes h/a better. VE unchanged. No palp cord. Pitocin had just started and thus unlikely to be the cause.  Scalp stimulation. (+) response. BP checked 96/47. Mat O2 was already in place with the issue of h/a( vasodilator). Repeat exam again unchanged. Fluid bolus started. Repeat scalp stimulation without response and continued deceleration. Verbal order for [place] terbutaline 0.25 to be given. Abdominal and scalp stimulation and positional change. Binder removed. Still no success. STAT C/S called. Pt advised need emergency surgery due to fetal heart rate down( 40's). OR notified

## 2016-06-29 NOTE — MAU Note (Signed)
Pt states she is having contractions that started at 0700 this morning and are 5-6 minutes apart.  Pt denies leaking of fluid.  Pt states she is having some spotting.  Pt states she is feeling the baby move.

## 2016-06-29 NOTE — Progress Notes (Signed)
Called to evaluate new headache after epidural placement. Epidural placed about 1045 AM today. She does not often get headaches. Headache is occipital and Alice Hays states it feels good to massage the area with her fingers. She states that this headache comes and goes and is worse with lying down and seems better when she is upright. She denies visual disturbance. She denies upper extremity weakness. No back pain. Her labor pains are well controlled with epidural.  She appears in no distress. Her neck ROM is normal. Her eye movements are normal. Moving toes to command bilaterally.  A/P: Headache, not clearly related to epidural placement. Plan tylenol if OK with OB.

## 2016-06-29 NOTE — Progress Notes (Signed)
Dr. Cherly Hensen notified of pt in MAU.  Notified that pt is a G2P0 at [redacted]w[redacted]d.  Notified that pt is 2.5, 60, -3, vertex, and having bloody show.  Notified that pt is having contractions every 2.5-4 minutes.  Notified that FHR tracing is reassuring but not reactive but pt has been given apple juice to drink.  Notified that pt's blood pressures were 143/104, 147/90, and 140/96.  Provider states to collect [facility] labs and call back.

## 2016-06-29 NOTE — Anesthesia Procedure Notes (Signed)
Epidural Patient location during procedure: OB  Staffing Anesthesiologist: Sherrian Divers  Preanesthetic Checklist Completed: patient identified, site marked, surgical consent, pre-op evaluation, timeout performed, IV checked, risks and benefits discussed and monitors and equipment checked  Epidural Patient position: sitting Prep: ChloraPrep Patient monitoring: blood pressure and heart rate Approach: midline Location: L4-L5 Injection technique: LOR saline  Needle:  Needle type: Tuohy  Needle gauge: 17 G Needle insertion depth: 7 cm Catheter type: closed end flexible Catheter size: 19 Gauge Catheter at skin depth: 15 cm Test dose: negative and Other  Assessment Events: blood not aspirated, injection not painful, no injection resistance, negative IV test and no paresthesia  Additional Notes 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 Prep completely dry at time of needle placement.Reason for block:procedure for pain

## 2016-06-29 NOTE — Anesthesia Postprocedure Evaluation (Signed)
Anesthesia Post Note  Patient: Alice Hays  Procedure(s) Performed: Procedure(s) (LRB): CESAREAN SECTION (N/A)  Patient location during evaluation: PACU Anesthesia Type: Epidural Level of consciousness: oriented and awake and alert Pain management: pain level controlled Vital Signs Assessment: post-procedure vital signs reviewed and stable Respiratory status: spontaneous breathing, respiratory function stable and patient connected to nasal cannula oxygen Cardiovascular status: blood pressure returned to baseline and stable Postop Assessment: no headache, no backache, epidural receding and patient able to bend at knees Anesthetic complications: no Comments: Headache resolved. Patient with no complaints.     Last Vitals:  Vitals:   06/29/16 1700 06/29/16 1710  BP: (!) 126/93 131/75  Pulse: 91 94  Resp: 18 18  Temp:  36.6 C    Last Pain:  Vitals:   06/29/16 1710  TempSrc: Oral  PainSc:    Pain Goal: Patients Stated Pain Goal: 2 (06/29/16 1013)               Jenne Sellinger J

## 2016-06-30 LAB — CBC
HCT: 30.1 % — ABNORMAL LOW (ref 36.0–46.0)
Hemoglobin: 10.2 g/dL — ABNORMAL LOW (ref 12.0–15.0)
MCH: 30.5 pg (ref 26.0–34.0)
MCHC: 33.9 g/dL (ref 30.0–36.0)
MCV: 90.1 fL (ref 78.0–100.0)
PLATELETS: 201 10*3/uL (ref 150–400)
RBC: 3.34 MIL/uL — AB (ref 3.87–5.11)
RDW: 13.7 % (ref 11.5–15.5)
WBC: 14.8 10*3/uL — ABNORMAL HIGH (ref 4.0–10.5)

## 2016-06-30 LAB — RPR: RPR: NONREACTIVE

## 2016-06-30 MED ORDER — POLYSACCHARIDE IRON COMPLEX 150 MG PO CAPS
150.0000 mg | ORAL_CAPSULE | Freq: Every day | ORAL | Status: DC
  Administered 2016-06-30 – 2016-07-02 (×3): 150 mg via ORAL
  Filled 2016-06-30 (×3): qty 1

## 2016-06-30 MED ORDER — IBUPROFEN 800 MG PO TABS
800.0000 mg | ORAL_TABLET | Freq: Three times a day (TID) | ORAL | Status: DC
  Administered 2016-06-30 – 2016-07-02 (×6): 800 mg via ORAL
  Filled 2016-06-30 (×6): qty 1

## 2016-06-30 MED ORDER — MAGNESIUM OXIDE 400 (241.3 MG) MG PO TABS
400.0000 mg | ORAL_TABLET | Freq: Every day | ORAL | Status: DC
  Administered 2016-06-30 – 2016-07-02 (×3): 400 mg via ORAL
  Filled 2016-06-30 (×3): qty 1

## 2016-06-30 NOTE — Progress Notes (Signed)
POSTOPERATIVE DAY # 1 S/P CS   S:         Reports feeling ok             Tolerating po intake / no nausea / no vomiting / no flatus / no BM             Bleeding is light             Pain controlled withlong-acting narcotic             Up ad lib / ambulatory/ voiding QS  Newborn breast feeding    O:  VS: BP 113/61   Pulse 74   Temp 97.8 F (36.6 C) (Oral)   Resp 18   Ht 4\' 11"  (1.499 m)   Wt 92.8 kg (204 lb 9.6 oz)   LMP 09/25/2015   SpO2 97%   Breastfeeding? Unknown   BMI 41.32 kg/m    LABS:               Recent Labs  06/29/16 0852 06/30/16 0540  WBC 8.0 14.8*  HGB 12.3 10.2*  PLT 230 201               Bloodtype: --/--/O POS, O POS (09/02 0852)  Rubella: Immune (04/04 0000)                                             I&O: Intake/Output      09/02 0701 - 09/03 0700 09/03 0701 - 09/04 0700   P.O. 360    I.V. (mL/kg) 3600 (38.8)    Total Intake(mL/kg) 3960 (42.7)    Urine (mL/kg/hr) 1725    Blood 650    Total Output 2375     Net +1585                       Physical Exam:             Alert and Oriented X3  Lungs: Clear and unlabored  Heart: regular rate and rhythm / no mumurs  Abdomen: soft, non-tender, mildly distended              Fundus: firm, non-tender, Ueven             Dressing intact honeycomb              Incision:  no erythema / no ecchymosis / no drainage  Perineum: intact  Lochia: light  Extremities: 1+ dependent edema, no calf pain or tenderness  A:        POD # 1 S/P CS            Mild ABL anemia  P:        Routine postoperative care              Advance activity as tolerate - anticipate DC tomorrow     Marlinda Mike CNM, MSN, [facility] 06/30/2016, 10:38 AM

## 2016-06-30 NOTE — Anesthesia Postprocedure Evaluation (Signed)
Anesthesia Post Note  Patient: Alice Hays  Procedure(s) Performed: Procedure(s) (LRB): CESAREAN SECTION (N/A)  Patient location during evaluation: Mother Baby Anesthesia Type: Epidural Level of consciousness: awake and alert Pain management: pain level controlled Vital Signs Assessment: post-procedure vital signs reviewed and stable Respiratory status: spontaneous breathing, nonlabored ventilation and respiratory function stable Cardiovascular status: stable Postop Assessment: no headache, no backache, epidural receding and patient able to bend at knees Anesthetic complications: no     Last Vitals:  Vitals:   06/30/16 0010 06/30/16 0646  BP: 136/78 113/61  Pulse: 86 74  Resp: 18 18  Temp: 37.1 C 36.6 C    Last Pain:  Vitals:   06/30/16 0646  TempSrc: Oral  PainSc:    Pain Goal: Patients Stated Pain Goal: 2 (06/29/16 1013)               Rica Records

## 2016-06-30 NOTE — Op Note (Signed)
NAMEJOANNE, Alice Hays            ACCOUNT NO.:  [id number]  MEDICAL RECORD NO.:  [id number]  LOCATION:  9104                          FACILITY:  [facility]  PHYSICIAN:  Maxie Better, M.D.DATE OF BIRTH:  May 14, 1981  DATE OF PROCEDURE:  06/29/2016 DATE OF DISCHARGE:                              OPERATIVE REPORT   PREOPERATIVE DIAGNOSIS:  Nonreassuring fetal tracing.  PROCEDURES: 1. Emergency primary cesarean section, Kerr hysterotomy.  POSTOPERATIVE DIAGNOSES: 1. Nonreassuring fetal tracing, cciput posterior presentation.  ANESTHESIA:  Epidural.  SURGEON:  Maxie Better, MD.  ASSISTANTS: 1. Myna Hidalgo, MD. 2. Marlinda Mike, C.N.M.  DESCRIPTION OF PROCEDURE:  The patient was quickly transferred to the operating room due to fetal heart rate deceleration, not responsive to scalp stimulation, positional changes, subcutaneous terbutaline.  In the operating room, the patient already had an indwelling Foley catheter in place.  Epidural anesthesia was initiated further for surgical management.  The fetal heart rate was checked to be 87.  A prep was quickly poured on the patient's abdomen after a quick shave had been done.  The patient was then quickly draped, tested for adequate anesthesia level, and a Pfannenstiel skin incision was then made and carried down to the rectus fascia.  The rectus fascia was opened transversely.  The rectus fascia was bluntly and sharply dissected off the rectus muscle in a superior and inferior fashion.  The rectus muscle split in the midline.  The parietal peritoneum was entered bluntly, and the bladder retractor was then placed.  Vesicouterine peritoneum was opened transversely, and the bladder was displaced inferiorly.  A curvilinear low transverse incision was then made and extended bluntly. Subsequent delivery of a live female from the occiput posterior presentation was accomplished.  Baby was pooping meconium, and the cord was clamped,  cut, was quickly crying, transferred to the awaiting pediatricians who assigned Apgars of 9 and 10 at 1 and 5 minutes.  The placenta which was posterior was manually removed after cord gases and cord bloods were obtained.  With the baby out, the self-retaining retractor was then placed.  The uterine cavity was cleaned of debris. The uterine incision had no extension, it was closed in 2 layers, the first layer with 0 Monocryl running lock stitch, the second layer was imbricated using 0 Monocryl suture.  Normal tubes and ovaries were noted bilaterally.  Uterine firmness was noted.  Abdomen was irrigated and suctioned.  The Alexis retractor removed.  Interceed was placed over the lower uterine segment in an inverted T fashion.  The parietal peritoneum was closed with 2-0 Vicryl.  The rectus fascia was closed x2 using O-vicry sutures.  The subcutaneous area was irrigated.  Small bleeders cauterized. Interrupted 2-0 plain sutures placed, and the skin approximated with Ethicon staples.  SPECIMENS:  Placenta sent to Pathology.  ESTIMATED BLOOD LOSS:  650 ml.  URINE OUTPUT:  400 ml.  INTRAOPERATIVE FLUIDS:  2300 ml.  COUNTS:  Sponge and instrument counts were actually correct.  COMPLICATIONS:  None.  The patient tolerated the procedure well, was transferred to the recovery room in stable condition.     Maxie Better, M.D.     [place]/MEDQ  D:  06/29/2016  T:  06/30/2016  Job:  [id number]

## 2016-06-30 NOTE — Lactation Note (Signed)
This note was copied from a baby's chart. Lactation Consultation Note  Patient Name: Alice Hays [id number] Date: 06/30/2016 Reason for consult: Initial assessment Visited with Mom, baby 47 hrs old.  This is Mom's first baby, had STAT C-Sect for fetal bradycardia, apgars 9 and 10. Room full of visitors at present.  Baby has been to breast one time, but otherwise has received all bottles of formula.  DEBP set up at bedside.  Mom pumped once, and didn't get anything so hasn't continued.  Basic breast feeding education shared, encouraged BFing or pumping to support her milk supply.  Offered assistance, but declined at present.  Offered to assist with the pump, but Mom stated she knew how to use.  Recommended she keep baby skin to skin, and feed her on cue.  Mom to call for assistance at next feeding, her RN is aware.  To pump if baby is unable to attain a deep areolar latch.  Recommend baby to be fed >8 times in 24 hrs.   Brochure left with Mom.  Informed her of IP and OP Lactation services available to her. Encouraged Mom to call for assistance as needed. Lactation to follow-up with Mom in am.   [name] 06/30/2016, 2:38 PM

## 2016-06-30 NOTE — Addendum Note (Signed)
Addendum  created 06/30/16 1003 by Rica Records, CRNA   Sign clinical note

## 2016-07-01 NOTE — Lactation Note (Signed)
This note was copied from a baby's chart. Lactation Consultation Note  Patient Name: Girl Ivry Danese [id number]'R Date: 07/01/2016 Reason for consult: Follow-up assessment Baby last had bottle at 11:30 am today, Mom attempted to BF at 1551, reports baby took few suckles (about 5 minutes) then fell asleep and would not latch. Mom reported she tried to give bottle but baby would not take bottle either. Baby now 18 hours old.  Baby has been to the breast 4 times in past 24 hours, had 3 supplements of formula. Adequate output. Discussed with Mom the importance of Baby being at breast 8-12 times in 24 hours and with feeding ques, nursing for 15-20 minutes both breasts some feedings. Advised baby should not go 5-6 hours between feedings. With assisting with latching baby at this visit, assisted Mom with breast compression for baby to obtain good depth with latch. Mom not independent yet and will need further assist. Baby demonstrated some good suckling bursts with swallowing motions observed off/on but no audible swallows with nursing on left breast, 1-2 on right breast. Few drops colostrum present with hand expression with assisting with latch. Feeding plan discussed with Mom: BF each feeding, try to keep baby nursing for 15-20 minutes both breasts most feedings. Call for assist with latch. Continue to supplement with feedings till baby latching better and waking to BF with feedings, audible swallows noted.  Supplement according to guidelines per hours of age, minimum of 18 ml each feeding tonight. With supplementing with bottle, MGM can give bottle after baby BF so Mom can post pump for 15 minutes to encourage milk production. RN advised Mom will be calling for assist.     Maternal Data    Feeding Feeding Type: Breast Fed Length of feed: 20 min  LATCH Score/Interventions Latch: Repeated attempts needed to sustain latch, nipple held in mouth throughout feeding, stimulation needed to elicit sucking  reflex. Intervention(s): Waking techniques;Teach feeding cues Intervention(s): Adjust position;Assist with latch;Breast massage;Breast compression  Audible Swallowing: A few with stimulation  Type of Nipple: Everted at rest and after stimulation  Comfort (Breast/Nipple): Soft / non-tender     Hold (Positioning): Assistance needed to correctly position infant at breast and maintain latch. Intervention(s): Breastfeeding basics reviewed;Support Pillows;Position options;Skin to skin  LATCH Score: 7  Lactation Tools Discussed/Used Tools: Pump Breast pump type: Double-Electric Breast Pump   Consult Status Consult Status: Follow-up Date: 07/02/16 Follow-up type: In-patient    Alfred Levins 07/01/2016, 6:43 PM

## 2016-07-01 NOTE — Progress Notes (Signed)
POSTOPERATIVE DAY # 2 S/P CS - emergency-bradycardia   S:         Reports feeling well- pain '2"             Tolerating po intake / no nausea / no vomiting / no flatus / no BM             Bleeding is light             Pain controlled with motrin and oxycodone             Up ad lib / ambulatory/ voiding QS  Newborn breast feeding  / female   O:  VS: BP 121/61   Pulse 85   Temp 97.9 F (36.6 C)   Resp 18   Ht 4\' 11"  (1.499 m)   Wt 92.8 kg (204 lb 9.6 oz)   LMP 09/25/2015   SpO2 97%   Breastfeeding? Unknown   BMI 41.32 kg/m    LABS:               Recent Labs  06/29/16 0852 06/30/16 0540  WBC 8.0 14.8*  HGB 12.3 10.2*  PLT 230 201               Bloodtype: --/--/O POS, O POS (09/02 6812)  Rubella: Immune (04/04 0000)                tdap 2017                                 Physical Exam:             Alert and Oriented X3  Lungs: Clear and unlabored  Heart: regular rate and rhythm / no mumurs  Abdomen: soft, non-tender, mildly distended, hypoactive BS             Fundus: firm, non-tender, Ueven             Dressing displaced - order to change dressing              Incision:  approximated with staples / no erythema / no ecchymosis / no drainage  Perineum: intact   Lochia: light  Extremities: 2+ pedal edema, no calf pain or tenderness, negative Homans  A:        POD # 2 S/P CS   P:        Routine postoperative care              Ambulate - warm fluids to increase bowel motility             Anticipate DC tomorrow - staple removal at [facility] Friday         Marlinda Mike CNM, MSN, [facility] 07/01/2016, 9:07 AM

## 2016-07-02 MED ORDER — SENNOSIDES-DOCUSATE SODIUM 8.6-50 MG PO TABS: 2.0000 | ORAL_TABLET | ORAL | 0 refills | Status: DC

## 2016-07-02 MED ORDER — OXYCODONE-ACETAMINOPHEN 5-325 MG PO TABS: 1.0000 | ORAL_TABLET | ORAL | 0 refills | Status: DC | PRN

## 2016-07-02 MED ORDER — IBUPROFEN 800 MG PO TABS: 800.0000 mg | ORAL_TABLET | Freq: Four times a day (QID) | ORAL | 0 refills | Status: DC | PRN

## 2016-07-02 NOTE — Lactation Note (Signed)
This note was copied from a baby's chart. Lactation Consultation Note  Patient Name: Alice Hays [id number] Date: 07/02/2016 Reason for consult: Follow-up assessment;Difficult latch Mom reports baby is not latching consistently, she is supplementing with EBM/formula. Attempted to help Mom with latch at this visit but baby too sleepy to latch. Mom is getting DEBP from insurance in the next few days. Encouraged 2 week loaner so Mom can continue to pump to encourage milk production, prevent engorgement and protect milk supply. Mom not able to rent pump. Hand pump given with instructions on use/cleaning. Advised to use 27 flange with pumping. Mom's breasts are filling. Stressed importance of emptying breast every 2-3 hours, using massage to soften nodules. Engorgement care reviewed with Mom and advised to refer to Baby N Me booklet, page 24. Breast Milk storage guidelines page 25. Encouraged to continue to work with latch so baby can help empty breasts. Advised baby should be at breast 8-12 times in 24 hours. Continue to supplement till baby latching consistently. OP f/u with lactation scheduled for Friday, 07/05/16 at 1:00pm.  LC left phone number for Mom to call if baby wakes and can work on latch before d/c today. Encouraged to call for questions/concerns.   Maternal Data    Feeding Feeding Type: Breast Fed Nipple Type: Regular Length of feed: 0 min  LATCH Score/Interventions Latch: Too sleepy or reluctant, no latch achieved, no sucking elicited. Intervention(s): Teach feeding cues Intervention(s): Breast compression;Adjust position;Assist with latch  Audible Swallowing: Spontaneous and intermittent Intervention(s): Hand expression Intervention(s): Alternate breast massage  Type of Nipple: Everted at rest and after stimulation  Comfort (Breast/Nipple): Filling, red/small blisters or bruises, mild/mod discomfort  Problem noted: Filling;Mild/Moderate discomfort (firm areas in breast,  massaged durng feeding) Interventions (Filling): Massage  Hold (Positioning): Assistance needed to correctly position infant at breast and maintain latch.  LATCH Score: 8  Lactation Tools Discussed/Used Tools: Pump Breast pump type: Double-Electric Breast Pump   Consult Status Consult Status: Follow-up Date: 07/02/16 Follow-up type: In-patient    Alfred Levins 07/02/2016, 11:18 AM

## 2016-07-02 NOTE — Progress Notes (Signed)
Patient ID: Alice Hays, female   DOB: 10/02/1981, 35 y.o.   MRN: [id number] POD # 3  Subjective: Pt reports feeling well and eager for d/c home / Pain controlled with ibuprofen and percocet Tolerating po/Voiding without problems/ No n/v/Flatus pos Activity: out of bed and ambulate Bleeding is light Newborn info:  Information for the patient's newborn:  Alice, Hays [[id number]]  female Feeding: breast   Objective: VS: Blood pressure (!) 157/92, pulse 83, temperature 98.4 F (36.9 C), temperature source Oral, resp. rate 18, height 4\' 11"  (1.499 m), weight 92.8 kg (204 lb 9.6 oz), last menstrual period 09/25/2015, SpO2 99 %, unknown if currently breastfeeding.   LABS:  Recent Labs  06/29/16 0852 06/30/16 0540  WBC 8.0 14.8*  HGB 12.3 10.2*  PLT 230 201                             Physical Exam:  General: alert, cooperative and no distress CV: Regular rate and rhythm Resp: clear Abdomen: Soft, NT, Fundus firm; hypoactive BS Incision: Covered with Tegaderm and honeycomb dressing; well approximated; closed with staples Uterine Fundus: firm, below umbilicus, nontender Lochia: minimal Ext: edema +2 to +3 pedal and pretib and Homans sign is negative, no sign of DVT    A/P: POD # 3 G2P1011/  S/P Primary C/Section d/t fetal distress  ABL Anemia Doing well and stable for discharge home Staple removal in the office Friday 07/05/16  RX's: Ibuprofen 600mg  po Q 6 hrs prn pain #30 Refill x 1 Percocet 5/325 1 - 2 tabs po every 6 hrs prn pain  #30 No refill Niferex 150mg  po QD/BID #30/#60 Refill x 1 Colace 100mg  po up to TID prn #30 Ref x 1    Signed: Demetrius Revel, MSN, [facility] 07/02/2016, 8:46 AM

## 2016-07-03 ENCOUNTER — Encounter (HOSPITAL_COMMUNITY): Payer: Self-pay | Admitting: *Deleted

## 2016-07-03 ENCOUNTER — Inpatient Hospital Stay (HOSPITAL_COMMUNITY)
Admission: AD | Admit: 2016-07-03 | Discharge: 2016-07-03 | Disposition: A | Discharge status: Home / Self Care | Payer: BLUE CROSS/BLUE SHIELD | Source: Ambulatory Visit | Attending: Obstetrics and Gynecology | Admitting: Obstetrics and Gynecology

## 2016-07-03 DIAGNOSIS — O165 Unspecified maternal hypertension, complicating the puerperium: Secondary | ICD-10-CM

## 2016-07-03 DIAGNOSIS — IMO0002 Reserved for concepts with insufficient information to code with codable children: Secondary | ICD-10-CM

## 2016-07-03 DIAGNOSIS — Z79899 Other long term (current) drug therapy: Secondary | ICD-10-CM | POA: Diagnosis not present

## 2016-07-03 DIAGNOSIS — Z4801 Encounter for change or removal of surgical wound dressing: Secondary | ICD-10-CM | POA: Insufficient documentation

## 2016-07-03 DIAGNOSIS — O9279 Other disorders of lactation: Secondary | ICD-10-CM | POA: Insufficient documentation

## 2016-07-03 NOTE — Discharge Instructions (Signed)
Incision Care An incision is when a surgeon cuts into your body. After surgery, the incision needs to be cared for properly to prevent infection.  HOW TO CARE FOR YOUR INCISION  Take medicines only as directed by your health care provider.  There are many different ways to close and cover an incision, including stitches, skin glue, and adhesive strips. Follow your health care provider's instructions on:  Incision care.  Bandage (dressing) changes and removal.  Incision closure removal.  Do not take baths, swim, or use a hot tub until your health care provider approves. You may shower as directed by your health care provider.  Resume your normal diet and activities as directed.  Use anti-itch medicine (such as an antihistamine) as directed by your health care provider. The incision may itch while it is healing. Do not pick or scratch at the incision.  Drink enough fluid to keep your urine clear or pale yellow. SEEK MEDICAL CARE IF:   You have drainage, redness, swelling, or pain at your incision site.  You have muscle aches, chills, or a general ill feeling.  You notice a bad smell coming from the incision or dressing.  Your incision edges separate after the sutures, staples, or skin adhesive strips have been removed.  You have persistent nausea or vomiting.  You have a fever.  You are dizzy. SEEK IMMEDIATE MEDICAL CARE IF:   You have a rash.  You faint.  You have difficulty breathing. MAKE SURE YOU:   Understand these instructions.  Will watch your condition.  Will get help right away if you are not doing well or get worse.   This information is not intended to replace advice given to you by your health care provider. Make sure you discuss any questions you have with your health care provider.   Document Released: 05/03/2005 Document Revised: 11/04/2014 Document Reviewed: 12/08/2013 Elsevier Interactive Patient Education 2016 [employer]  Hypertension During  Pregnancy Hypertension, or high blood pressure, is when there is extra pressure inside your blood vessels that carry blood from the heart to the rest of your body (arteries). It can happen at any time in life, including pregnancy. Hypertension during pregnancy can cause problems for you and your baby. Your baby might not weigh as much as he or she should at birth or might be born early (premature). Very bad cases of hypertension during pregnancy can be life-threatening.  Different types of hypertension can occur during pregnancy. These include:  Chronic hypertension. This happens when a woman has hypertension before pregnancy and it continues during pregnancy.  Gestational hypertension. This is when hypertension develops during pregnancy.  Preeclampsia or toxemia of pregnancy. This is a very serious type of hypertension that develops only during pregnancy. It affects the whole body and can be very dangerous for both mother and baby.  Gestational hypertension and preeclampsia usually go away after your baby is born. Your blood pressure will likely stabilize within 6 weeks. Women who have hypertension during pregnancy have a greater chance of developing hypertension later in life or with future pregnancies. RISK FACTORS There are certain factors that make it more likely for you to develop hypertension during pregnancy. These include:  Having hypertension before pregnancy.  Having hypertension during a previous pregnancy.  Being overweight.  Being older than 40 years.  Being pregnant with more than one baby.  Having diabetes or kidney problems. SIGNS AND SYMPTOMS Chronic and gestational hypertension rarely cause symptoms. Preeclampsia has symptoms, which may include:  Increased  protein in your urine. Your health care provider will check for this at every prenatal visit.  Swelling of your hands and face.  Rapid weight gain.  Headaches.  Visual changes.  Being bothered by  light.  Abdominal pain, especially in the upper right area.  Chest pain.  Shortness of breath.  Increased reflexes.  Seizures. These occur with a more severe form of preeclampsia, called eclampsia. DIAGNOSIS  You may be diagnosed with hypertension during a regular prenatal exam. At each prenatal visit, you may have:  Your blood pressure checked.  A urine test to check for protein in your urine. The type of hypertension you are diagnosed with depends on when you developed it. It also depends on your specific blood pressure reading.  Developing hypertension before 20 weeks of pregnancy is consistent with chronic hypertension.  Developing hypertension after 20 weeks of pregnancy is consistent with gestational hypertension.  Hypertension with increased urinary protein is diagnosed as preeclampsia.  Blood pressure measurements that stay above 160 systolic or 110 diastolic are a sign of severe preeclampsia. TREATMENT Treatment for hypertension during pregnancy varies. Treatment depends on the type of hypertension and how serious it is.  If you take medicine for chronic hypertension, you may need to switch medicines.  Medicines called ACE inhibitors should not be taken during pregnancy.  Low-dose aspirin may be suggested for women who have risk factors for preeclampsia.  If you have gestational hypertension, you may need to take a blood pressure medicine that is safe during pregnancy. Your health care provider will recommend the correct medicine.  If you have severe preeclampsia, you may need to be in the hospital. Health care providers will watch you and your baby very closely. You also may need to take medicine called magnesium sulfate to prevent seizures and lower blood pressure.  Sometimes, an early delivery is needed. This may be the case if the condition worsens. It would be done to protect you and your baby. The only cure for preeclampsia is delivery.  Your health care  provider may recommend that you take one low-dose aspirin (81 mg) each day to help prevent high blood pressure during your pregnancy if you are at risk for preeclampsia. You may be at risk for preeclampsia if:  You had preeclampsia or eclampsia during a previous pregnancy.  Your baby did not grow as expected during a previous pregnancy.  You experienced preterm birth with a previous pregnancy.  You experienced a separation of the placenta from the uterus (placental abruption) during a previous pregnancy.  You experienced the loss of your baby during a previous pregnancy.  You are pregnant with more than one baby.  You have other medical conditions, such as diabetes or an autoimmune disease. HOME CARE INSTRUCTIONS  Schedule and keep all of your regular prenatal care appointments. This is important.  Take medicines only as directed by your health care provider. Tell your health care provider about all medicines you take.  Eat as little salt as possible.  Get regular exercise.  Do not drink alcohol.  Do not use tobacco products.  Do not drink products with caffeine.  Lie on your left side when resting. SEEK IMMEDIATE MEDICAL CARE IF:  You have severe abdominal pain.  You have sudden swelling in your hands, ankles, or face.  You gain 4 pounds (1.8 kg) or more in 1 week.  You vomit repeatedly.  You have vaginal bleeding.  You do not feel your baby moving as much.  You have  a headache.  You have blurred or double vision.  You have muscle twitching or spasms.  You have shortness of breath.  You have blue fingernails or lips.  You have blood in your urine. MAKE SURE YOU:  Understand these instructions.  Will watch your condition.  Will get help right away if you are not doing well or get worse.   This information is not intended to replace advice given to you by your health care provider. Make sure you discuss any questions you have with your health care  provider.   Document Released: 07/02/2011 Document Revised: 11/04/2014 Document Reviewed: 05/13/2013 Elsevier Interactive Patient Education [employer]

## 2016-07-03 NOTE — Lactation Note (Signed)
Lactation Consultation Note Mom engorged. Breast hard shiny, knots and tenderness. DEBP pumped 4 1/2 oz. From Rt. Breast. Pumped 1 oz. From Lt. Breast. Pumped 3 times w/DEBP to relieve breast. While mom pumping massaged breast to assist in emptying breast. Mom has generalized edema. Abd. Very distended. Applied ice. LC massaged while pumping to assist in expressing transitional milk. Laid supine as could tolerate w/ice to breast, ( to remove after 20 min,) arms back over head to assist in milk drainage. After 1 hr. Pump again. Discussed applying cabbage to assist in softening engorgement, not to use after softening d/t will cut back milk supply.  Mom has large nipples. #30 flange given for pumping. Stated baby just gets on nipple and it hurts so she has given formula some at home. Discuss supply and demand. Why engorgement happens, prevention, pumping, BF, filling of breast. Mastitis prevention. Mom asked how can she dry her milk up. Mom has a good supply of milk, praised her for a good supply, how her milk coming in can be painful, staying on top of emptying breast will prevent engorgement again. Mom states baby likes similac. Encouraged to pump and bottle feed BM, benefits of giving BM either by breast or by bottle. Mom stated she will see. Discussed baby being to best pump. Report given to RN.  Patient Name: Alice Hays [email or web address] Date: 07/03/2016 Reason for consult: Initial assessment;Breast/nipple pain   Maternal Data    Feeding    LATCH Score/Interventions          Comfort (Breast/Nipple): Engorged, cracked, bleeding, large blisters, severe discomfort Problem noted: Engorgment Intervention(s): Ice           Lactation Tools Discussed/Used Tools: Pump Breast pump type: Double-Electric Breast Pump Pump Review: Milk Storage Initiated by:: LC Date initiated:: 07/03/16   Consult Status Consult Status: Complete    Senya Hinzman G 07/03/2016, 3:42 AM

## 2016-07-03 NOTE — MAU Note (Signed)
PT SAYS  HER BREAST FELT  BETTER AFTER PUMPING  AND  CABBAGE LEAVES.

## 2016-07-03 NOTE — MAU Note (Signed)
PT SAYS  SHE  DEL BY C/S ON  SAT  THEN WENT  HOME  TODAY  .  WHILE  SHE WAS IN SHOWER-  SHE ACCIDENTALLY PULLED  DRESSING  OFF   AND  SHE  WORRIED.     IN TRIAGE -    TEGADERM  IS  STILL OVER  INCISION.        BREASTFEEDING.-   SAYS   ENGORGED  -    EVEN  WHEN  SHE  PUMPS -  NOTHING   OUT-   FIRST BABY.

## 2016-07-03 NOTE — MAU Provider Note (Signed)
Chief Complaint: Dressing Change   First Provider Initiated Contact with Patient 07/03/16 0204     SUBJECTIVE HPI: Alice Hays is a 35 y.o. G2P1011 at 4 days status post low transverse C-section on 06/29/2016 who presents to [facility] reporting outer C-section dressing inadvertently coming off in the shower (dressing underneath is still in place) and severe breast engorgement. Had some incision pain that has resolved spontaneously. States she was told to come to the hospital if her dressing came off before her postpartum checkup scheduled 07/05/2016.   Associated signs and symptoms: Negative for fever, chills or incisional tenderness, bleeding or drainage.  Was induced for gestational hypertension. Was not prescribed any blood pressure medication. Denies headache, vision changes or epigastric pain.  Past Medical History:  Diagnosis Date  . Anemia   . Ectopic pregnancy    L adnexa  . Hx of varicella    OB History  Gravida Para Term Preterm AB Living  2 1 1   1 1   SAB TAB Ectopic Multiple Live Births      1 0 1    # Outcome Date GA Lbr Len/2nd Weight Sex Delivery Anes PTL Lv  2 Term 06/29/16 [id number]  7 lb 3.7 oz (3.28 kg) F CS-LTranv EPI  LIV  1 Ectopic              Past Surgical History:  Procedure Laterality Date  . ARM SURGERY    . CESAREAN SECTION     Social History   Social History  . Marital status: Single    Spouse name: N/A  . Number of children: N/A  . Years of education: N/A   Occupational History  . Not on file.   Social History Main Topics  . Smoking status: Never Smoker  . Smokeless tobacco: Never Used  . Alcohol use No  . Drug use: No  . Sexual activity: Yes    Birth control/ protection: None   Other Topics Concern  . Not on file   Social History Narrative  . No narrative on file   No current facility-administered medications on file prior to encounter.    Current Outpatient Prescriptions on File Prior to Encounter  Medication  Sig Dispense Refill  . ibuprofen (ADVIL,MOTRIN) 800 MG tablet Take 1 tablet (800 mg total) by mouth every 6 (six) hours as needed (pain). 30 tablet 0  . oxyCODONE-acetaminophen (PERCOCET/ROXICET) 5-325 MG tablet Take 1 tablet by mouth every 4 (four) hours as needed (pain scale 4-7). 30 tablet 0  . ferrous sulfate 325 (65 FE) MG tablet Take 325 mg by mouth daily with breakfast.    . Prenatal Vit-Fe Fumarate-FA (PRENATAL MULTIVITAMIN) TABS tablet Take 1 tablet by mouth daily at 12 noon.     . senna-docusate (SENOKOT-S) 8.6-50 MG tablet Take 2 tablets by mouth daily. 20 tablet 0   Allergies  Allergen Reactions  . Shellfish Allergy Anaphylaxis  . Iodine     I have reviewed the past Medical Hx, Surgical Hx, Social Hx, Allergies and Medications.   Review of Systems  Constitutional: Negative for chills and fever.  Eyes: Negative for visual disturbance.  Gastrointestinal: Positive for abdominal pain (brief incisional pain, resolved).  Genitourinary: Positive for vaginal bleeding (Normal lochia).  Skin:       Negative for incisional tenderness, erythema, drainage or bleeding.   Neurological: Negative for headaches.    OBJECTIVE Patient Vitals for the past 24 hrs:  BP Temp Temp src Pulse Resp  07/03/16 0141 145/89 - -  102 -  07/03/16 0111 141/97 98.4 F (36.9 C) Oral 102 18   Constitutional: Well-developed, well-nourished female in no acute distress.  Cardiovascular: Mild tachycardia Respiratory: normal rate and effort.  GI: Abd soft, non-tender. Skin: Tegaderm dressing in place. Incision partially visible through Tegaderm. No visible separation, erythema, swelling, bleeding or drainage. Breasts: Bilateral engorgement, right greater than left. No erythema or distinct masses. MS: Extremities nontender, no edema, normal ROM Neurologic: Alert and oriented x 4.  GU: Deferred  LAB RESULTS No results found for this or any previous visit (from the past 24 hour(s)).  IMAGING No results  found.  MAU COURSE Orders Placed This Encounter  Procedures  . Apply cold compresses  . Breast pump to bed  . Discharge patient  Lactation consultation performed. Able to pump.  Discuss history, exam, blood pressures with Dr. Billy Coast. Agrees with plan of care. Patient will follow-up in 2 days for incision check.  MDM - 4 days post C-section healing well. No evidence of incision complication. - Breast engorgement, improving with pumping and assistance of lactation consultant. No evidence of mastitis or other emergent condition. - Postpartum gestational hypertension without evidence of preeclampsia.  ASSESSMENT 1. Hypertension in pregnancy, postpartum condition   2. Dressing dry and intact   3. Breast engorgement, postpartum     PLAN Discharge home in stable condition. Preeclampsia Precautions Mastitis precautions Take ibuprofen on schedule and apply cold compresses to relieve inflammation of breasts  Follow-up Information    Lenoard Aden, MD Follow up on 07/05/2016.   Specialty:  Obstetrics and Gynecology Why:  For incision check and blood pressure check Contact information: [address] [place] [address] [phone]        [facility] [place] MATERNITY ADMISSIONS .   Why:  As needed in emergencies Contact information: [address] [phone] mc [place] [address] [phone]           Medication List    TAKE these medications   ferrous sulfate 325 (65 FE) MG tablet Take 325 mg by mouth daily with breakfast.   ibuprofen 800 MG tablet Commonly known as:  ADVIL,MOTRIN Take 1 tablet (800 mg total) by mouth every 6 (six) hours as needed (pain).   oxyCODONE-acetaminophen 5-325 MG tablet Commonly known as:  PERCOCET/ROXICET Take 1 tablet by mouth every 4 (four) hours as needed (pain scale 4-7).   prenatal multivitamin Tabs tablet Take 1 tablet by mouth daily at 12 noon.   senna-docusate 8.6-50 MG  tablet Commonly known as:  Senokot-S Take 2 tablets by mouth daily.        [place], CNM 07/03/2016  3:26 AM

## 2016-07-03 NOTE — MAU Note (Signed)
Pt took her dressing off in the shower. Tegderm still in place @ this time. Pt did not realize that Tegaderm was still covering incision. Pt having breast pain and having trouble with getting the baby latched on.

## 2016-07-05 ENCOUNTER — Ambulatory Visit (HOSPITAL_COMMUNITY): Admit: 2016-07-05 | Payer: BLUE CROSS/BLUE SHIELD

## 2016-11-20 NOTE — Discharge Summary (Signed)
Obstetric Discharge Summary Reason for Admission: onset of labor Prenatal Procedures: ultrasound Intrapartum Procedures: cesarean: low cervical, transverse Postpartum Procedures: none Complications-Operative and Postpartum: none Hemoglobin  Date Value Ref Range Status  06/30/2016 10.2 (L) 12.0 - 15.0 g/dL Final   HCT  Date Value Ref Range Status  06/30/2016 30.1 (L) 36.0 - 46.0 % Final    Physical Exam:  General: alert, cooperative and no distress Lochia: appropriate Uterine Fundus: firm Incision: healing well DVT Evaluation: No evidence of DVT seen on physical exam. Hospital course: pt was admitted in labor. Fetal bradycardia resulted in emergency C/S. See op note for details. Pt had uncomplicated postoperative course Discharge Diagnoses: Term Pregnancy-delivered  Discharge Information: Date: 07/02/2016 Activity: pelvic rest Diet: routine Medications: PNV, Ibuprofen and Percocet Condition: stable Instructions: refer to practice specific booklet Discharge to: home Follow-up Information    FOGLEMAN,KELLY A., MD Follow up in 6 week(s).   Specialty:  Obstetrics and Gynecology Why:  Please resturn for staple removal this Friday, 07/05/16 @ 2:00 pm.  Please call to make your postpartum appointment.  Please call to schedule your post partum visit Contact information: [address] [place] [address] [phone]           Newborn Data: Live born female  Birth Weight: 7 lb 3.7 oz (3280 g) APGAR: 9, 10  Home with mother.  Delmas Faucett A 11/20/2016, 5:14 AM

## 2016-11-27 DIAGNOSIS — Z3202 Encounter for pregnancy test, result negative: Secondary | ICD-10-CM | POA: Diagnosis not present

## 2016-11-27 DIAGNOSIS — Z3042 Encounter for surveillance of injectable contraceptive: Secondary | ICD-10-CM | POA: Diagnosis not present

## 2017-02-19 DIAGNOSIS — Z3042 Encounter for surveillance of injectable contraceptive: Secondary | ICD-10-CM | POA: Diagnosis not present

## 2017-05-08 DIAGNOSIS — Z3042 Encounter for surveillance of injectable contraceptive: Secondary | ICD-10-CM | POA: Diagnosis not present

## 2017-09-17 DIAGNOSIS — Z01419 Encounter for gynecological examination (general) (routine) without abnormal findings: Secondary | ICD-10-CM | POA: Diagnosis not present

## 2017-09-17 DIAGNOSIS — Z130 Encounter for screening for diseases of the blood and blood-forming organs and certain disorders involving the immune mechanism: Secondary | ICD-10-CM | POA: Diagnosis not present

## 2017-09-17 DIAGNOSIS — Z131 Encounter for screening for diabetes mellitus: Secondary | ICD-10-CM | POA: Diagnosis not present

## 2017-09-17 DIAGNOSIS — Z113 Encounter for screening for infections with a predominantly sexual mode of transmission: Secondary | ICD-10-CM | POA: Diagnosis not present

## 2017-09-17 DIAGNOSIS — Z6836 Body mass index (BMI) 36.0-36.9, adult: Secondary | ICD-10-CM | POA: Diagnosis not present

## 2017-09-17 DIAGNOSIS — Z118 Encounter for screening for other infectious and parasitic diseases: Secondary | ICD-10-CM | POA: Diagnosis not present

## 2017-09-17 DIAGNOSIS — Z1231 Encounter for screening mammogram for malignant neoplasm of breast: Secondary | ICD-10-CM | POA: Diagnosis not present

## 2017-09-17 DIAGNOSIS — Z0000 Encounter for general adult medical examination without abnormal findings: Secondary | ICD-10-CM | POA: Diagnosis not present

## 2017-09-17 DIAGNOSIS — Z1329 Encounter for screening for other suspected endocrine disorder: Secondary | ICD-10-CM | POA: Diagnosis not present

## 2017-09-17 DIAGNOSIS — Z13220 Encounter for screening for lipoid disorders: Secondary | ICD-10-CM | POA: Diagnosis not present

## 2017-09-17 DIAGNOSIS — Z1159 Encounter for screening for other viral diseases: Secondary | ICD-10-CM | POA: Diagnosis not present

## 2018-11-25 ENCOUNTER — Encounter (HOSPITAL_COMMUNITY): Payer: Self-pay

## 2018-11-25 ENCOUNTER — Other Ambulatory Visit: Payer: Self-pay

## 2018-11-25 ENCOUNTER — Emergency Department (HOSPITAL_COMMUNITY): Payer: Self-pay

## 2018-11-25 ENCOUNTER — Emergency Department (HOSPITAL_COMMUNITY)
Admission: EM | Admit: 2018-11-25 | Discharge: 2018-11-25 | Disposition: A | Discharge status: Home / Self Care | Payer: Self-pay | Attending: Emergency Medicine | Admitting: Emergency Medicine

## 2018-11-25 DIAGNOSIS — O4690 Antepartum hemorrhage, unspecified, unspecified trimester: Secondary | ICD-10-CM | POA: Insufficient documentation

## 2018-11-25 DIAGNOSIS — Z3A21 21 weeks gestation of pregnancy: Secondary | ICD-10-CM | POA: Insufficient documentation

## 2018-11-25 DIAGNOSIS — R8271 Bacteriuria: Secondary | ICD-10-CM | POA: Insufficient documentation

## 2018-11-25 DIAGNOSIS — Z79899 Other long term (current) drug therapy: Secondary | ICD-10-CM | POA: Insufficient documentation

## 2018-11-25 LAB — URINALYSIS, ROUTINE W REFLEX MICROSCOPIC
Bilirubin Urine: NEGATIVE
GLUCOSE, UA: NEGATIVE mg/dL
Ketones, ur: NEGATIVE mg/dL
Leukocytes, UA: NEGATIVE
Nitrite: POSITIVE — AB
Protein, ur: NEGATIVE mg/dL
Specific Gravity, Urine: 1.026 (ref 1.005–1.030)
pH: 5 (ref 5.0–8.0)

## 2018-11-25 LAB — COMPREHENSIVE METABOLIC PANEL
ALK PHOS: 48 U/L (ref 38–126)
ALT: 10 U/L (ref 0–44)
AST: 13 U/L — AB (ref 15–41)
Albumin: 3.3 g/dL — ABNORMAL LOW (ref 3.5–5.0)
Anion gap: 8 (ref 5–15)
BUN: 12 mg/dL (ref 6–20)
CALCIUM: 8.7 mg/dL — AB (ref 8.9–10.3)
CO2: 23 mmol/L (ref 22–32)
CREATININE: 0.63 mg/dL (ref 0.44–1.00)
Chloride: 104 mmol/L (ref 98–111)
GFR calc Af Amer: >60 mL/min (ref >60)
GFR calc non Af Amer: >60 mL/min (ref >60)
GLUCOSE: 106 mg/dL — AB (ref 70–99)
Potassium: 3.7 mmol/L (ref 3.5–5.1)
SODIUM: 135 mmol/L (ref 135–145)
Total Bilirubin: 0.4 mg/dL (ref 0.3–1.2)
Total Protein: 6.7 g/dL (ref 6.5–8.1)

## 2018-11-25 LAB — CBC
HCT: 35.7 % — ABNORMAL LOW (ref 36.0–46.0)
Hemoglobin: 11.3 g/dL — ABNORMAL LOW (ref 12.0–15.0)
MCH: 27.2 pg (ref 26.0–34.0)
MCHC: 31.7 g/dL (ref 30.0–36.0)
MCV: 85.8 fL (ref 80.0–100.0)
PLATELETS: 276 10*3/uL (ref 150–400)
RBC: 4.16 MIL/uL (ref 3.87–5.11)
RDW: 14.9 % (ref 11.5–15.5)
WBC: 7.9 10*3/uL (ref 4.0–10.5)
nRBC: 0 % (ref 0.0–0.2)

## 2018-11-25 LAB — TYPE AND SCREEN
ABO/RH(D): O POS
ANTIBODY SCREEN: NEGATIVE

## 2018-11-25 LAB — I-STAT BETA HCG BLOOD, ED (MC, WL, AP ONLY)

## 2018-11-25 LAB — HCG, QUANTITATIVE, PREGNANCY: hCG, Beta Chain, Quant, S: 108868 m[IU]/mL — ABNORMAL HIGH (ref <5)

## 2018-11-25 MED ORDER — NITROFURANTOIN MONOHYD MACRO 100 MG PO CAPS: 100.0000 mg | ORAL_CAPSULE | Freq: Two times a day (BID) | ORAL | 0 refills | Status: DC

## 2018-11-25 NOTE — ED Notes (Signed)
Pt returned from ultrasound

## 2018-11-25 NOTE — ED Provider Notes (Signed)
[facility] EMERGENCY DEPARTMENT Provider Note   CSN: [id number] Arrival date & time: 11/25/18  0430     History   Chief Complaint Chief Complaint  Patient presents with  . Vaginal Bleeding    [redacted] weeks pregnant    HPI Alice Hays is a 38 y.o. female.  38 year old female with prior medical history as detailed below presents for evaluation of vaginal spotting.  Patient reports that she is approximately [redacted] weeks pregnant.  She has not yet seen OB for this pregnancy.  She reports that this will be her third pregnancy.  She has 30 29-year-old child at home.  She has previously been treated for an ectopic pregnancy without surgical intervention.  Patient reports that she has an appointment later this week on Friday established with OB.  She reports that she had some vaginal spotting that started this morning around 3 AM.  She denies abdominal pain, nausea, vomiting, back pain, chest pain, shortness of breath, or other acute complaint.  The history is provided by the patient and medical records.  Vaginal Bleeding  Quality:  Spotting Severity:  Mild Onset quality:  Sudden Duration:  4 hours Timing:  Constant Progression:  Waxing and waning Chronicity:  New Relieved by:  Nothing Worsened by:  Nothing Associated symptoms: no abdominal pain     Past Medical History:  Diagnosis Date  . Anemia   . Ectopic pregnancy    L adnexa  . Hx of varicella     Patient Active Problem List   Diagnosis Date Noted  . Labor abnormal 06/29/2016  .  Postpartum care s/p cesarean section (9/2) 06/29/2016    Past Surgical History:  Procedure Laterality Date  . ARM SURGERY    . CESAREAN SECTION    . CESAREAN SECTION N/A 06/29/2016   Procedure: CESAREAN SECTION;  Surgeon: Maxie Better, MD;  [facility]: [facility];  Service: Obstetrics;  Laterality: N/A;     OB History    Gravida  3   Para  1   Term  1   Preterm      AB  1   Living  1     SAB     TAB      Ectopic  1   Multiple  0   Live Births  1            Home Medications    Prior to Admission medications   Medication Sig Start Date End Date Taking? Authorizing Provider  ferrous sulfate 325 (65 FE) MG tablet Take 325 mg by mouth daily with breakfast.    [provider]  ibuprofen (ADVIL,MOTRIN) 800 MG tablet Take 1 tablet (800 mg total) by mouth every 6 (six) hours as needed (pain). 07/02/16   Arlana Lindau, NP  oxyCODONE-acetaminophen (PERCOCET/ROXICET) 5-325 MG tablet Take 1 tablet by mouth every 4 (four) hours as needed (pain scale 4-7). 07/02/16   Arlana Lindau, NP  Prenatal Vit-Fe Fumarate-FA (PRENATAL MULTIVITAMIN) TABS tablet Take 1 tablet by mouth daily at 12 noon.     [provider]  senna-docusate (SENOKOT-S) 8.6-50 MG tablet Take 2 tablets by mouth daily. 07/02/16   Arlana Lindau, NP    Family History Family History  Problem Relation Age of Onset  . Cancer Mother   . Heart disease Mother   . Heart disease Father   . Hypertension Father   . Stroke Father   . Cancer Maternal Aunt   . Diabetes Maternal Grandmother  Social History Social History   Tobacco Use  . Smoking status: Never Smoker  . Smokeless tobacco: Never Used  Substance Use Topics  . Alcohol use: No  . Drug use: No     Allergies   Shellfish allergy and Iodine   Review of Systems Review of Systems  Gastrointestinal: Negative for abdominal pain.  Genitourinary: Positive for vaginal bleeding.  All other systems reviewed and are negative.    Physical Exam Updated Vital Signs BP 140/87 (BP Location: Right Arm)   Pulse 91   Temp 98.3 F (36.8 C) (Oral)   Resp 18   LMP  (Exact Date)   SpO2 100%   Physical Exam Vitals signs and nursing note reviewed.  Constitutional:      General: She is not in acute distress.    Appearance: She is well-developed.  HENT:     Head: Normocephalic and atraumatic.  Eyes:     Conjunctiva/sclera: Conjunctivae normal.      Pupils: Pupils are equal, round, and reactive to light.  Neck:     Musculoskeletal: Normal range of motion and neck supple.  Cardiovascular:     Rate and Rhythm: Normal rate and regular rhythm.     Heart sounds: Normal heart sounds.  Pulmonary:     Effort: Pulmonary effort is normal. No respiratory distress.     Breath sounds: Normal breath sounds.  Abdominal:     General: There is no distension.     Palpations: Abdomen is soft.     Tenderness: There is no abdominal tenderness.  Genitourinary:    Comments: Patient declines pelvic exam.  She has an OB visit scheduled for later this week. Musculoskeletal: Normal range of motion.        General: No deformity.  Skin:    General: Skin is warm and dry.  Neurological:     Mental Status: She is alert and oriented to person, place, and time.      ED Treatments / Results  Labs (all labs ordered are listed, but only abnormal results are displayed) Labs Reviewed  COMPREHENSIVE METABOLIC PANEL - Abnormal; Notable for the following components:      Result Value   Glucose, Bld 106 (*)    Calcium 8.7 (*)    Albumin 3.3 (*)    AST 13 (*)    All other components within normal limits  CBC - Abnormal; Notable for the following components:   Hemoglobin 11.3 (*)    HCT 35.7 (*)    All other components within normal limits  URINALYSIS, ROUTINE W REFLEX MICROSCOPIC - Abnormal; Notable for the following components:   APPearance HAZY (*)    Hgb urine dipstick SMALL (*)    Nitrite POSITIVE (*)    Bacteria, UA MANY (*)    All other components within normal limits  HCG, QUANTITATIVE, PREGNANCY - Abnormal; Notable for the following components:   hCG, Beta Chain, [place] LongestQuant, S [id number] (*)    All other components within normal limits  I-STAT BETA HCG BLOOD, ED ([facility], [facility], [facility] ONLY) - Abnormal; Notable for the following components:   I-stat hCG, quantitative >2,000.0 (*)    All other components within normal limits  TYPE AND SCREEN     EKG None  Radiology [place] Ob Comp < 14 Wks  Result Date: 11/25/2018 CLINICAL DATA:  Vaginal bleeding during pregnancy. EXAM: OBSTETRIC <14 WK US AND TRANSVAGINAL OB US TECHNIQUE: Both transabdominal and transvaginal ultrasound examinations were performed for complete evaluation of the gestation as well  as the maternal uterus, adnexal regions, and pelvic cul-de-sac. Transvaginal technique was performed to assess early pregnancy. COMPARISON:  None. FINDINGS: Intrauterine gestational sac: Single Yolk sac:  Present Embryo:  Present Cardiac Activity: Present Heart Rate: 158 bpm MSD:   mm    w     d CRL:  16.6 mm   8 w   1 d                  [place] EDC: 07/06/2019 Subchorionic hemorrhage:  Small Maternal uterus/adnexae: Normal ovaries.  No free fluid IMPRESSION: 1. Single intrauterine gestation with embryo and normal cardiac activity. 2. Estimated gestational age by crown rump length equals 8 weeks 1 day. 3.  Small subchorionic hemorrhage. Electronically Signed   By: Genevive Bi M.D.   On: 11/25/2018 10:02   US Ob Transvaginal  Result Date: 11/25/2018 CLINICAL DATA:  Vaginal bleeding during pregnancy. EXAM: OBSTETRIC <14 WK [place] AND TRANSVAGINAL OB US TECHNIQUE: Both transabdominal and transvaginal ultrasound examinations were performed for complete evaluation of the gestation as well as the maternal uterus, adnexal regions, and pelvic cul-de-sac. Transvaginal technique was performed to assess early pregnancy. COMPARISON:  None. FINDINGS: Intrauterine gestational sac: Single Yolk sac:  Present Embryo:  Present Cardiac Activity: Present Heart Rate: 158 bpm MSD:   mm    w     d CRL:  16.6 mm   8 w   1 d                  [place] EDC: 07/06/2019 Subchorionic hemorrhage:  Small Maternal uterus/adnexae: Normal ovaries.  No free fluid IMPRESSION: 1. Single intrauterine gestation with embryo and normal cardiac activity. 2. Estimated gestational age by crown rump length equals 8 weeks 1 day. 3.  Small subchorionic  hemorrhage. Electronically Signed   By: Genevive Bi M.D.   On: 11/25/2018 10:02    Procedures Procedures (including critical care time)  Medications Ordered in ED Medications - No data to display   Initial Impression / Assessment and Plan / ED Course  I have reviewed the triage vital signs and the nursing notes.  Pertinent labs & imaging results that were available during my care of the patient were reviewed by me and considered in my medical decision making (see chart for details).     MDM  Screen complete  Patient is presenting for vaginal spotting in the setting of an early pregnancy.  Ultrasound demonstrates IUP at approximately 28 weeks of age.  Fetal heart rate is detected.  Small subchorionic hemorrhage is seen on ultrasound.  Patient's screening labs are without significant abnormality.  Patient is type O+.  Hemoglobin today is 11.3.  Urine sample did show the presence of bacteria.  This will be treated.  Patient has already established follow-up later this week with OB.  Patient does understand the need for close follow-up.  Strict return precautions given and understood.  Final Clinical Impressions(s) / ED Diagnoses   Final diagnoses:  Vaginal bleeding in pregnancy  Bacteriuria    ED Discharge Orders         Ordered    nitrofurantoin, macrocrystal-monohydrate, (MACROBID) 100 MG capsule  2 times daily     11/25/18 1031           Wynetta Fines, MD 11/25/18 1034

## 2018-11-25 NOTE — ED Triage Notes (Signed)
Patient c/o spotting around 3am this morning. States that she is [redacted] weeks pregnant. Has not been to see OBGYN first appt is 2 days from now on Friday.

## 2018-11-25 NOTE — ED Notes (Signed)
Patient transported to Ultrasound 

## 2018-11-25 NOTE — Discharge Instructions (Addendum)
Return for any problem.  Follow-up with your regular OB later this week as previously scheduled.

## 2019-01-02 ENCOUNTER — Telehealth: Payer: Self-pay | Admitting: Women's Health

## 2019-01-02 ENCOUNTER — Other Ambulatory Visit: Payer: Self-pay

## 2019-01-02 ENCOUNTER — Inpatient Hospital Stay (HOSPITAL_COMMUNITY): Payer: Self-pay

## 2019-01-02 ENCOUNTER — Encounter (HOSPITAL_COMMUNITY): Payer: Self-pay | Admitting: Student

## 2019-01-02 ENCOUNTER — Inpatient Hospital Stay (HOSPITAL_COMMUNITY)
Admission: AD | Admit: 2019-01-02 | Discharge: 2019-01-02 | Disposition: A | Discharge status: Home / Self Care | Payer: Self-pay | Source: Ambulatory Visit | Attending: Obstetrics & Gynecology | Admitting: Obstetrics & Gynecology

## 2019-01-02 DIAGNOSIS — I10 Essential (primary) hypertension: Secondary | ICD-10-CM

## 2019-01-02 DIAGNOSIS — O4691 Antepartum hemorrhage, unspecified, first trimester: Secondary | ICD-10-CM

## 2019-01-02 DIAGNOSIS — Z79899 Other long term (current) drug therapy: Secondary | ICD-10-CM | POA: Insufficient documentation

## 2019-01-02 DIAGNOSIS — O161 Unspecified maternal hypertension, first trimester: Secondary | ICD-10-CM | POA: Insufficient documentation

## 2019-01-02 DIAGNOSIS — Z3A13 13 weeks gestation of pregnancy: Secondary | ICD-10-CM | POA: Insufficient documentation

## 2019-01-02 DIAGNOSIS — O10011 Pre-existing essential hypertension complicating pregnancy, first trimester: Secondary | ICD-10-CM

## 2019-01-02 DIAGNOSIS — Z8249 Family history of ischemic heart disease and other diseases of the circulatory system: Secondary | ICD-10-CM | POA: Insufficient documentation

## 2019-01-02 DIAGNOSIS — Z888 Allergy status to other drugs, medicaments and biological substances status: Secondary | ICD-10-CM | POA: Insufficient documentation

## 2019-01-02 DIAGNOSIS — O4692 Antepartum hemorrhage, unspecified, second trimester: Secondary | ICD-10-CM

## 2019-01-02 DIAGNOSIS — O034 Incomplete spontaneous abortion without complication: Secondary | ICD-10-CM | POA: Insufficient documentation

## 2019-01-02 HISTORY — DX: Essential (primary) hypertension: I10

## 2019-01-02 LAB — CBC
HEMATOCRIT: 37.9 % (ref 36.0–46.0)
Hemoglobin: 12.2 g/dL (ref 12.0–15.0)
MCH: 28 pg (ref 26.0–34.0)
MCHC: 32.2 g/dL (ref 30.0–36.0)
MCV: 86.9 fL (ref 80.0–100.0)
Platelets: 267 10*3/uL (ref 150–400)
RBC: 4.36 MIL/uL (ref 3.87–5.11)
RDW: 14.8 % (ref 11.5–15.5)
WBC: 7.1 10*3/uL (ref 4.0–10.5)
nRBC: 0 % (ref 0.0–0.2)

## 2019-01-02 LAB — TYPE AND SCREEN
ABO/RH(D): O POS
Antibody Screen: NEGATIVE

## 2019-01-02 MED ORDER — MISOPROSTOL 200 MCG PO TABS
800.0000 ug | ORAL_TABLET | Freq: Once | ORAL | Status: AC
  Administered 2019-01-02: 800 ug via BUCCAL
  Filled 2019-01-02: qty 4

## 2019-01-02 MED ORDER — OXYCODONE-ACETAMINOPHEN 5-325 MG PO TABS: 1.0000 | ORAL_TABLET | Freq: Four times a day (QID) | ORAL | 0 refills | Status: DC | PRN

## 2019-01-02 MED ORDER — NIFEDIPINE ER OSMOTIC RELEASE 30 MG PO TB24: 30.0000 mg | ORAL_TABLET | Freq: Every day | ORAL | 0 refills | Status: DC

## 2019-01-02 MED ORDER — PROMETHAZINE HCL 25 MG PO TABS: 25.0000 mg | ORAL_TABLET | Freq: Four times a day (QID) | ORAL | 0 refills | Status: DC | PRN

## 2019-01-02 MED ORDER — IBUPROFEN 600 MG PO TABS: 600.0000 mg | ORAL_TABLET | Freq: Four times a day (QID) | ORAL | 0 refills | Status: DC | PRN

## 2019-01-02 NOTE — MAU Note (Signed)
Presents with c/o heavy VB that began this morning @ 0530.  Reports passing clots and tissue.  States having mild menstrual type cramping.

## 2019-01-02 NOTE — MAU Provider Note (Addendum)
Chief Complaint: Vaginal Bleeding   First Provider Initiated Contact with Patient 01/02/19 0734     SUBJECTIVE HPI: Alice Hays is a 38 y.o. G3P1011 at [redacted]w[redacted]d who presents to [facility] Admissions reporting vaginal bleeding. Symptoms began this morning. Report heavy bleeding & passing large clots. Also reports menstrual like cramping.  Goes to [facility] for prenatal care but will be transferred to [facility] d/t CHTN. Currently no meds for HTN.   Location: abdomen Quality: cramping Severity: 4/10 on pain scale Duration: 2 hours Timing: intermittent Modifying factors: none Associated signs and symptoms: vaginal bleeding  Past Medical History:  Diagnosis Date  . Anemia   . Ectopic pregnancy    L adnexa  . Hx of varicella   . Hypertension    OB History  Gravida Para Term Preterm AB Living  3 1 1   1 1   SAB TAB Ectopic Multiple Live Births      1 0 1    # Outcome Date GA Lbr Len/2nd Weight Sex Delivery Anes PTL Lv  3 Current           2 Term 06/29/16 [redacted]w[redacted]d  3280 g F CS-LTranv EPI  LIV  1 Ectopic 02/2014           Past Surgical History:  Procedure Laterality Date  . ARM SURGERY    . CESAREAN SECTION N/A 06/29/2016   Procedure: CESAREAN SECTION;  Surgeon: Maxie Better, MD;  Location: [facility];  Service: Obstetrics;  Laterality: N/A;   Social History   Socioeconomic History  . Marital status: Married    Spouse name: Not on file  . Number of children: Not on file  . Years of education: Not on file  . Highest education level: Not on file  Occupational History  . Not on file  Social Needs  . Financial resource strain: Not on file  . Food insecurity:    Worry: Not on file    Inability: Not on file  . Transportation needs:    Medical: Not on file    Non-medical: Not on file  Tobacco Use  . Smoking status: Never Smoker  . Smokeless tobacco: Never Used  Substance and Sexual Activity  . Alcohol use: No  . Drug use: No  . Sexual activity: Yes    Birth  control/protection: None  Lifestyle  . Physical activity:    Days per week: Not on file    Minutes per session: Not on file  . Stress: Not on file  Relationships  . Social connections:    Talks on phone: Not on file    Gets together: Not on file    Attends religious service: Not on file    Active member of club or organization: Not on file    Attends meetings of clubs or organizations: Not on file    Relationship status: Not on file  . Intimate partner violence:    Fear of current or ex partner: Not on file    Emotionally abused: Not on file    Physically abused: Not on file    Forced sexual activity: Not on file  Other Topics Concern  . Not on file  Social History Narrative  . Not on file   Family History  Problem Relation Age of Onset  . Cancer Mother   . Heart disease Mother   . Heart disease Father   . Hypertension Father   . Stroke Father   . Cancer Maternal Aunt   . Diabetes Maternal  Grandmother    No current facility-administered medications on file prior to encounter.    Current Outpatient Medications on File Prior to Encounter  Medication Sig Dispense Refill  . ferrous sulfate 325 (65 FE) MG tablet Take 325 mg by mouth daily with breakfast.    . ibuprofen (ADVIL,MOTRIN) 800 MG tablet Take 1 tablet (800 mg total) by mouth every 6 (six) hours as needed (pain). 30 tablet 0  . nitrofurantoin, macrocrystal-monohydrate, (MACROBID) 100 MG capsule Take 1 capsule (100 mg total) by mouth 2 (two) times daily. 10 capsule 0  . oxyCODONE-acetaminophen (PERCOCET/ROXICET) 5-325 MG tablet Take 1 tablet by mouth every 4 (four) hours as needed (pain scale 4-7). 30 tablet 0  . Prenatal Vit-Fe Fumarate-FA (PRENATAL MULTIVITAMIN) TABS tablet Take 1 tablet by mouth daily at 12 noon.     . senna-docusate (SENOKOT-S) 8.6-50 MG tablet Take 2 tablets by mouth daily. 20 tablet 0   Allergies  Allergen Reactions  . Shellfish Allergy Anaphylaxis  . Iodine     I have reviewed patient's  Past Medical Hx, Surgical Hx, Family Hx, Social Hx, medications and allergies.   Review of Systems  Constitutional: Negative.   Gastrointestinal: Positive for abdominal pain.  Genitourinary: Positive for vaginal bleeding.    OBJECTIVE Patient Vitals for the past 24 hrs:  BP Temp Temp src Pulse Resp SpO2 Height Weight  01/02/19 1023 (!) 145/96 97.9 F (36.6 C) Oral 86 19 98 % - -  01/02/19 0805 - - - - - 99 % - -  01/02/19 0804 (!) 154/101 - - 79 - - - -  01/02/19 0800 - - - - - 98 % - -  01/02/19 0732 (!) 165/94 - - 89 - - - -  01/02/19 0722 (!) 168/108 98.3 F (36.8 C) Oral 85 20 100 % 4\' 10"  (1.473 m) 84 kg   Constitutional: Well-developed, well-nourished female in no acute distress.  Cardiovascular: normal rate & rhythm, no murmur Respiratory: normal rate and effort. Lung sounds clear throughout GI: Abd soft, non-tender, Pos BS x 4. No guarding or rebound tenderness MS: Extremities nontender, no edema, normal ROM Neurologic: Alert and oriented x 4.  GU:     SPECULUM EXAM: NEFG, large amount of dark red bleeding & multiple large clots removed from vagina using fox swabs. Cervix visually 1 cm.     LAB RESULTS Results for orders placed or performed during the hospital encounter of 01/02/19 (from the past 24 hour(s))  Type and screen     Status: None   Collection Time: 01/02/19  7:50 AM  Result Value Ref Range   ABO/RH(D) O POS    Antibody Screen NEG    Sample Expiration      01/05/2019 Performed at [facility], [address] [place], [place] [address]   CBC     Status: None   Collection Time: 01/02/19  8:01 AM  Result Value Ref Range   WBC 7.1 4.0 - 10.5 K/uL   RBC 4.36 3.87 - 5.11 MIL/uL   Hemoglobin 12.2 12.0 - 15.0 g/dL   HCT 46.2 86.3 - 81.7 %   MCV 86.9 80.0 - 100.0 fL   MCH 28.0 26.0 - 34.0 pg   MCHC 32.2 30.0 - 36.0 g/dL   RDW 71.1 65.7 - 90.3 %   Platelets 267 150 - 400 K/uL   nRBC 0.0 0.0 - 0.2 %    IMAGING US Ob Comp Less 14 Wks  Result  Date: 01/02/2019 CLINICAL DATA:  Vaginal bleeding. Thirteen weeks and 0 days pregnant by last menstrual period. No quantitative beta HCG available. EXAM: OBSTETRIC <14 WK ULTRASOUND TECHNIQUE: Transabdominal ultrasound was performed for evaluation of the gestation as well as the maternal uterus and adnexal regions. COMPARISON:  11/25/2018. FINDINGS: Intrauterine gestational sac: Not visualized Maternal uterus/adnexae: The endometrium is markedly thickened and heterogeneous, measuring 43.3 mm in maximum thickness. There is prominent internal blood flow within the thickened, heterogeneous endometrium with color Doppler. Normal appearing maternal ovaries. No free peritoneal fluid. IMPRESSION: Findings compatible with interval spontaneous abortion. The endometrium is markedly thickened, heterogeneous and has prominent internal blood flow, concerning for retained products of conception. Electronically Signed   By: Beckie Salts M.D.   On: 01/02/2019 09:11    MAU COURSE Orders Placed This Encounter  Procedures  . US OB Comp Less 14 Wks  . CBC  . Type and screen  . Saline lock IV   Meds ordered this encounter  Medications  . misoprostol (CYTOTEC) tablet 800 mcg  . ibuprofen (ADVIL,MOTRIN) 600 MG tablet    Sig: Take 1 tablet (600 mg total) by mouth every 6 (six) hours as needed for up to 30 doses for moderate pain or cramping.    Dispense:  30 tablet    Refill:  0    Order Specific Question:   Supervising Provider    Answer:   Reva Bores [[id number]]  . oxyCODONE-acetaminophen (PERCOCET) 5-325 MG tablet    Sig: Take 1-2 tablets by mouth every 6 (six) hours as needed for up to 8 doses for severe pain.    Dispense:  8 tablet    Refill:  0    Order Specific Question:   Supervising Provider    Answer:   Reva Bores [[id number]]  . promethazine (PHENERGAN) 25 MG tablet    Sig: Take 1 tablet (25 mg total) by mouth every 6 (six) hours as needed for up to 8 doses for nausea or vomiting.    Dispense:  8 tablet     Refill:  0    Order Specific Question:   Supervising Provider    Answer:   Reva Bores [[id number]]    MDM Pt with CHTN & elevated BPs. Currently not on meds; hasn't been on meds since last year. Will plan to discharge with antihypertensives.   RH positive On CBC H/H 12.2/37.9, platelets 267, T&S.  Ultrasound ordered IV access  Care turned over to [facility] FNP  Judeth Horn, NP 01/02/2019  7:34 AM  US showed miscarriage, concerning for retained POCs. After [place], pt reports still passing clots, pad shows blood mixed with mucus across entire pad, no presence of clots, blood present on inner thighs  Discussed inpatient and outpatient Cytotec with pt vs. Expectant management. Pt desires inpatient Cytotec management.  Blood Type: O+  Early Intrauterine Pregnancy Failure Protocol X  Documented intrauterine pregnancy failure X  No serious current illness  X  Baseline Hgb greater than or equal to 10g/dl  X  Patient has easily accessible transportation to the hospital  X  Clear preference - pt discussed with husband while provider was out of the room X  Practitioner/physician deems patient reliable  X  Counseling by practitioner or physician  X  Patient education by RN  X  Medication dispensed  X  Cytotec 800 mcg, buccal X   Ibuprofen 600 mg 1 tablet by mouth every 6 hours as needed - prescribed  X   Percocet 5/325 1-2 tabs every  6 hours as needed - prescribed  X   Phenergan 25 mg by mouth every 6 hours as needed for nausea - prescribed  Reviewed with pt cytotec procedure.  Pt verbalizes that she lives close to the hospital and has transportation readily available.  Pt appears reliable and verbalizes understanding and agrees with plan of care.  Assessment & Plan  1. Incomplete miscarriage   2. [redacted] weeks gestation of pregnancy   3. Vaginal bleeding in pregnancy, second trimester   4. Essential hypertension affecting pregnancy in first trimester    -discharge to home with  husband -discussed expectations for miscarriage/cytotec/medications and appropriate use -discussed bleeding/MAU return precautions -note sent to clinic, pt aware will need to f/u this coming week for BP check, hCG/Cytotec -pt aware if not pregnant will need to find primary care provider to manage BP  Shyna Duignan, Odie Sera, NP  11:35 AM 01/02/2019

## 2019-01-02 NOTE — Telephone Encounter (Signed)
Attempted to call patient regarding f/u clinic appointment scheduled for Wednesday 01/06/2019 @2 :35pm at [facility] [address]. Left VM for patient to return call to MAU, preferably prior to 8pm.

## 2019-01-02 NOTE — Discharge Instructions (Signed)
FACTS YOU SHOULD KNOW  WHAT IS AN EARLY PREGNANCY FAILURE? Once the egg is fertilized with the sperm and begins to develop, it attaches to the lining of the uterus. This early pregnancy tissue may not develop into an embryo (the beginning stage of a baby). Sometimes an embryo does develop but does not continue to grow. These problems can be seen on ultrasound.   MANAGEMNT OF EARLY PREGNANCY FAILURE: About 4 out of 100 (0.25%) women will have a pregnancy loss in her lifetime.  One in five pregnancies is found to be an early pregnancy failure.  There are 3 ways to care for an early pregnancy failure:   (1) Surgery, (2) Medicine, (3) Waiting for you to pass the pregnancy on your own. The decision as to how to proceed after being diagnosed with and early pregnancy failure is an individual one.  The decision can be made only after appropriate counseling.  You need to weigh the pros and cons of the 3 choices. Then you can make the choice that works for you. SURGERY (D&E) . Procedure over in 1 day . Requires being put to sleep . Bleeding may be light . Possible problems during surgery, including injury to womb(uterus) . Care provider has more control Medicine (CYTOTEC) . The complete procedure may take days to weeks . No Surgery . Bleeding may be heavy at times . There may be drug side effects . Patient has more control Waiting . You may choose to wait, in which case your own body may complete the passing of the abnormal early pregnancy on its own in about 2-4 weeks . Your bleeding may be heavy at times . There is a small possibility that you may need surgery if the bleeding is too much or not all of the pregnancy has passed. CYTOTEC MANAGEMENT Prostaglandins (cytotec) are the most widely used drug for this purpose. They cause the uterus to cramp and contract. You will place the medicine yourself inside your vagina in the privacy of your home. Empting of the uterus should occur within 3 days but  the process may continue for several weeks. The bleeding may seem heavy at times. POSSIBLE SIDE EFFECTS FROM CYTOTEC . Nausea   Vomiting . Diarrhea Fever . Chills  Hot Flashes Side effects  from the process of the early pregnancy failure include: . Cramping  Bleeding . Headaches  Dizziness RISKS: This is a low risk procedure. Less than 1 in 100 women has a complication. An incomplete passage of the early pregnancy may occur. Also, Hemorrhage (heavy bleeding) could happen.  Rarely the pregnancy will not be passed completely. Excessively heavy bleeding may occur.  Your doctor may need to perform surgery to empty the uterus (D&E). Afterwards: Everybody will feel differently after the early pregnancy completion. You may have soreness or cramps for a day or two. You may have soreness or cramps for day or two.  You may have light bleeding for up to 2 weeks. You may be as active as you feel like being. If you have any of the following problems you may call Maternity Admissions Unit at [phone]. . If you have pain that does not get better  with pain medication . Bleeding that soaks through 2 thick full-sized sanitary pads in an hour . Cramps that last longer than 2 days . Foul smelling discharge . Fever above 100.4 degrees F Even if you do not have any of these symptoms, you should have a follow-up exam to make sure you   are healing properly. This appointment will be made for you before you leave the hospital. Your next normal period will start again in 4-6 week after the loss. You can get pregnant soon after the loss, so use birth control right away. Finally: Make sure all your questions are answered before during and after any procedure. Follow up with medical care and family planning methods.      Hypertension During Pregnancy  Hypertension, commonly called high blood pressure, is when the force of blood pumping through your arteries is too strong. Arteries are blood vessels that carry blood  from the heart throughout the body. Hypertension during pregnancy can cause problems for you and your baby. Your baby may be born early (prematurely) or may not weigh as much as he or she should at birth. Very bad cases of hypertension during pregnancy can be life-threatening. Different types of hypertension can occur during pregnancy. These include:  Chronic hypertension. This happens when: ? You have hypertension before pregnancy and it continues during pregnancy. ? You develop hypertension before you are [redacted] weeks pregnant, and it continues during pregnancy.  Gestational hypertension. This is hypertension that develops after the 20th week of pregnancy.  Preeclampsia, also called toxemia of pregnancy. This is a very serious type of hypertension that develops during pregnancy. It can be very dangerous for you and your baby. ? In rare cases, you may develop preeclampsia after giving birth (postpartum preeclampsia). This usually occurs within 48 hours after childbirth but may occur up to 6 weeks after giving birth. Gestational hypertension and preeclampsia usually go away within 6 weeks after your baby is born. Women who have hypertension during pregnancy have a greater chance of developing hypertension later in life or during future pregnancies. What are the causes? The exact cause of hypertension during pregnancy is not known. What increases the risk? There are certain factors that make it more likely for you to develop hypertension during pregnancy. These include:  Having hypertension during a previous pregnancy or prior to pregnancy.  Being overweight.  Being age 37 or older.  Being pregnant for the first time.  Being pregnant with more than one baby.  Becoming pregnant using fertilization methods such as IVF (in vitro fertilization).  Having diabetes, kidney problems, or systemic lupus erythematosus.  Having a family history of hypertension. What are the signs or symptoms? Chronic  hypertension and gestational hypertension rarely cause symptoms. Preeclampsia causes symptoms, which may include:  Increased protein in your urine. Your health care provider will check for this at every visit before you give birth (prenatal visit).  Severe headaches.  Sudden weight gain.  Swelling of the hands, face, legs, and feet.  Nausea and vomiting.  Vision problems, such as blurred or double vision.  Numbness in the face, arms, legs, and feet.  Dizziness.  Slurred speech.  Sensitivity to bright lights.  Abdominal pain.  Convulsions or seizures. How is this diagnosed? You may be diagnosed with hypertension during a routine prenatal exam. At each prenatal visit, you may:  Have a urine test to check for high amounts of protein in your urine.  Have your blood pressure checked. A blood pressure reading is given as two numbers, such as "120 over 80" (or 120/80). The first ("top") number is a measure of the pressure in your arteries when your heart beats (systolic pressure). The second ("bottom") number is a measure of the pressure in your arteries as your heart relaxes between beats (diastolic pressure). Blood pressure is measured in  a unit called mm Hg. For most women, a normal blood pressure reading is: ? Systolic: below 120. ? Diastolic: below 80. The type of hypertension that you are diagnosed with depends on your test results and when your symptoms developed.  Chronic hypertension is usually diagnosed before 20 weeks of pregnancy.  Gestational hypertension is usually diagnosed after 20 weeks of pregnancy.  Hypertension with high amounts of protein in the urine is diagnosed as preeclampsia.  Blood pressure measurements that stay above 160 systolic, or above 110 diastolic, are signs of severe preeclampsia. How is this treated? Treatment for hypertension during pregnancy varies depending on the type of hypertension you have and how serious it is.  If you take medicines  called ACE inhibitors to treat chronic hypertension, you may need to switch medicines. ACE inhibitors should not be taken during pregnancy.  If you have gestational hypertension, you may need to take blood pressure medicine.  If you are at risk for preeclampsia, your health care provider may recommend that you take a low-dose aspirin during your pregnancy.  If you have severe preeclampsia, you may need to be hospitalized so you and your baby can be monitored closely. You may also need to take medicine (magnesium sulfate) to prevent seizures and to lower blood pressure. This medicine may be given as an injection or through an IV.  In some cases, if your condition gets worse, you may need to deliver your baby early. Follow these instructions at home: Eating and drinking   Drink enough fluid to keep your urine pale yellow.  Avoid caffeine. Lifestyle  Do not use any products that contain nicotine or tobacco, such as cigarettes and e-cigarettes. If you need help quitting, ask your health care provider.  Do not use alcohol or drugs.  Avoid stress as much as possible. Rest and get plenty of sleep. General instructions  Take over-the-counter and prescription medicines only as told by your health care provider.  While lying down, lie on your left side. This keeps pressure off your major blood vessels.  While sitting or lying down, raise (elevate) your feet. Try putting some pillows under your lower legs.  Exercise regularly. Ask your health care provider what kinds of exercise are best for you.  Keep all prenatal and follow-up visits as told by your health care provider. This is important. Contact a health care provider if:  You have symptoms that your health care provider told you may require more treatment or monitoring, such as: ? Nausea or vomiting. ? Headache. Get help right away if you have:  Severe abdominal pain that does not get better with treatment.  A severe headache  that does not get better.  Vomiting that does not get better.  Sudden, rapid weight gain.  Sudden swelling in your hands, ankles, or face.  Vaginal bleeding.  Blood in your urine.  Fewer movements from your baby than usual.  Blurred or double vision.  Muscle twitching or sudden muscle tightening (spasms).  Shortness of breath.  Blue fingernails or lips. Summary  Hypertension, commonly called high blood pressure, is when the force of blood pumping through your arteries is too strong.  Hypertension during pregnancy can cause problems for you and your baby.  Treatment for hypertension during pregnancy varies depending on the type of hypertension you have and how serious it is.  Get help right away if you have symptoms that your health care provider told you to watch for. This information is not intended to replace advice  given to you by your health care provider. Make sure you discuss any questions you have with your health care provider. Document Released: 07/02/2011 Document Revised: 09/30/2017 Document Reviewed: 03/29/2016 Elsevier Interactive Patient Education  2019 [employer].

## 2019-01-06 ENCOUNTER — Ambulatory Visit: Payer: Self-pay

## 2019-01-07 ENCOUNTER — Ambulatory Visit: Payer: Medicaid Other | Admitting: *Deleted

## 2019-01-07 ENCOUNTER — Other Ambulatory Visit: Payer: Self-pay

## 2019-01-07 VITALS — BP 136/69 | HR 97

## 2019-01-07 DIAGNOSIS — O034 Incomplete spontaneous abortion without complication: Secondary | ICD-10-CM

## 2019-01-07 NOTE — Progress Notes (Signed)
Here for sab follow up non stat bhcg and bp check. Had cytotec 01/02/19. States had a lot of bleeding and clots on 01/02/19 and then since then light bleeding everyday - enough to fill one pad per day. C/o intermittent mild cramps.  Non stat bhcg drawn and informed patient we will call with results but will draw weekly until level 5 or less. Advised to make appt in 2 weeks to see provider for follow up.  BP checked twice, 2nd wnl. Advised to seek PCP for BP management. Support given.  Linda,RN

## 2019-01-07 NOTE — Progress Notes (Signed)
I have reviewed the chart and agree with nursing staff's documentation of this patient's encounter.  Heather Hogan DNP, CNM  01/07/19  4:42 PM      

## 2019-01-08 LAB — BETA HCG QUANT (REF LAB): hCG Quant: 784 m[IU]/mL

## 2019-01-12 ENCOUNTER — Telehealth: Payer: Self-pay | Admitting: Family Medicine

## 2019-01-12 NOTE — Telephone Encounter (Signed)
Attempted to call patient to let her know of the office restrictions due to the coronavirus. No answer, left detailed message with the restriction as well as office number if needing to reschedule.

## 2019-01-14 ENCOUNTER — Other Ambulatory Visit: Payer: Self-pay | Admitting: *Deleted

## 2019-01-14 ENCOUNTER — Other Ambulatory Visit: Payer: Self-pay

## 2019-01-14 DIAGNOSIS — O034 Incomplete spontaneous abortion without complication: Secondary | ICD-10-CM

## 2019-01-20 ENCOUNTER — Other Ambulatory Visit: Payer: Self-pay

## 2019-01-20 ENCOUNTER — Ambulatory Visit: Payer: Medicaid Other | Admitting: Nurse Practitioner

## 2019-10-02 ENCOUNTER — Encounter (HOSPITAL_COMMUNITY): Payer: Self-pay

## 2019-11-10 ENCOUNTER — Other Ambulatory Visit: Payer: Self-pay

## 2019-11-10 ENCOUNTER — Inpatient Hospital Stay (HOSPITAL_COMMUNITY)
Admission: AD | Admit: 2019-11-10 | Discharge: 2019-11-11 | Disposition: A | Discharge status: Home / Self Care | Payer: BC Managed Care – PPO | Attending: Obstetrics & Gynecology | Admitting: Obstetrics & Gynecology

## 2019-11-10 ENCOUNTER — Encounter (HOSPITAL_COMMUNITY): Payer: Self-pay | Admitting: Obstetrics & Gynecology

## 2019-11-10 DIAGNOSIS — O26891 Other specified pregnancy related conditions, first trimester: Secondary | ICD-10-CM

## 2019-11-10 DIAGNOSIS — R109 Unspecified abdominal pain: Secondary | ICD-10-CM | POA: Diagnosis not present

## 2019-11-10 DIAGNOSIS — Z3202 Encounter for pregnancy test, result negative: Secondary | ICD-10-CM | POA: Insufficient documentation

## 2019-11-10 DIAGNOSIS — R0602 Shortness of breath: Secondary | ICD-10-CM | POA: Diagnosis present

## 2019-11-10 DIAGNOSIS — Z3A01 Less than 8 weeks gestation of pregnancy: Secondary | ICD-10-CM | POA: Diagnosis not present

## 2019-11-10 DIAGNOSIS — I10 Essential (primary) hypertension: Secondary | ICD-10-CM | POA: Diagnosis not present

## 2019-11-10 LAB — CBC
HCT: 32.5 % — ABNORMAL LOW (ref 36.0–46.0)
Hemoglobin: 9.7 g/dL — ABNORMAL LOW (ref 12.0–15.0)
MCH: 22.1 pg — ABNORMAL LOW (ref 26.0–34.0)
MCHC: 29.8 g/dL — ABNORMAL LOW (ref 30.0–36.0)
MCV: 74 fL — ABNORMAL LOW (ref 80.0–100.0)
Platelets: 270 10*3/uL (ref 150–400)
RBC: 4.39 MIL/uL (ref 3.87–5.11)
RDW: 17.8 % — ABNORMAL HIGH (ref 11.5–15.5)
WBC: 4.2 10*3/uL (ref 4.0–10.5)
nRBC: 0 % (ref 0.0–0.2)

## 2019-11-10 LAB — URINALYSIS, ROUTINE W REFLEX MICROSCOPIC
Bilirubin Urine: NEGATIVE
Glucose, UA: NEGATIVE mg/dL
Hgb urine dipstick: NEGATIVE
Ketones, ur: NEGATIVE mg/dL
Nitrite: NEGATIVE
Protein, ur: NEGATIVE mg/dL
Specific Gravity, Urine: 1.019 (ref 1.005–1.030)
pH: 7 (ref 5.0–8.0)

## 2019-11-10 LAB — POCT PREGNANCY, URINE: Preg Test, Ur: NEGATIVE

## 2019-11-10 MED ORDER — LABETALOL HCL 5 MG/ML IV SOLN: 40.0000 mg | Freq: Once | INTRAVENOUS | Status: DC

## 2019-11-10 NOTE — MAU Note (Signed)
Took upt a wk ago and was positive. Tonight started having abd pain and SOB. Has chronic HTN but not on meds now due to ins changes. Has ins now thru job but has not started back on meds. Has appt with Dr Ernestina Penna 12/02/19 but has not been at that office in over a yr. Denies any vag bleeding

## 2019-11-10 NOTE — MAU Note (Signed)
Discussed pt with L Leftwich-Kirby CNM. Pt was put in negative pressure room due to SOB but no other symptoms until upt determined. Aware of elevated BP. Pt's upt is negative here. Will do BHCG and then decide pt's poc. Since pt has mentioned no other symptoms, will not isolate for now per CNM but leave in negative pressure room pending BHCG. Pt aware and agrees with poc

## 2019-11-11 ENCOUNTER — Encounter (HOSPITAL_COMMUNITY): Payer: Self-pay | Admitting: *Deleted

## 2019-11-11 DIAGNOSIS — Z3A01 Less than 8 weeks gestation of pregnancy: Secondary | ICD-10-CM

## 2019-11-11 DIAGNOSIS — R109 Unspecified abdominal pain: Secondary | ICD-10-CM

## 2019-11-11 DIAGNOSIS — O26891 Other specified pregnancy related conditions, first trimester: Secondary | ICD-10-CM

## 2019-11-11 DIAGNOSIS — Z3202 Encounter for pregnancy test, result negative: Secondary | ICD-10-CM

## 2019-11-11 LAB — HCG, QUANTITATIVE, PREGNANCY: hCG, Beta Chain, Quant, S: <1 m[IU]/mL (ref <5)

## 2019-11-11 MED ORDER — NIFEDIPINE ER OSMOTIC RELEASE 30 MG PO TB24: 30.0000 mg | ORAL_TABLET | Freq: Every day | ORAL | 0 refills | Status: DC

## 2019-11-11 NOTE — MAU Provider Note (Signed)
Chief Complaint: Abdominal Pain and Shortness of Breath   None     Not pregnant, HTN, 180s/100s, SOB    SUBJECTIVE HPI: Alice Hays is a 39 y.o. [phone] with positive pregnancy test at home who presents to maternity admissions reporting shortness of breath with exertion and abdominal pain today.  She has hx of ectopic pregnancy so was worried about this.  The abdominal pain is low in her abdomen, intermittent cramping pain.  It does not radiate.  She has hx of HTN but recently was without insurance so was unable to get her BP medication. She denies any respiratory symptoms or sick contacts.  She is not short of breath at rest.   She denies any chest pain.  There are no other symptoms.     HPI  Past Medical History:  Diagnosis Date  . Anemia   . Ectopic pregnancy    L adnexa  . Hx of varicella   . Hypertension    Past Surgical History:  Procedure Laterality Date  . ARM SURGERY    . CESAREAN SECTION N/A 06/29/2016   Procedure: CESAREAN SECTION;  Surgeon: Maxie Better, MD;  Location: [facility];  Service: Obstetrics;  Laterality: N/A;   Social History   Socioeconomic History  . Marital status: Married    Spouse name: Not on file  . Number of children: Not on file  . Years of education: Not on file  . Highest education level: Not on file  Occupational History  . Not on file  Tobacco Use  . Smoking status: Never Smoker  . Smokeless tobacco: Never Used  Substance and Sexual Activity  . Alcohol use: No  . Drug use: No  . Sexual activity: Yes    Birth control/protection: None  Other Topics Concern  . Not on file  Social History Narrative  . Not on file   Social Determinants of Health   Financial Resource Strain:   . Difficulty of Paying Living Expenses: Not on file  Food Insecurity:   . Worried About Programme researcher, broadcasting/film/video in the Last Year: Not on file  . Ran Out of Food in the Last Year: Not on file  Transportation Needs:   . Lack of  Transportation (Medical): Not on file  . Lack of Transportation (Non-Medical): Not on file  Physical Activity:   . Days of Exercise per Week: Not on file  . Minutes of Exercise per Session: Not on file  Stress:   . Feeling of Stress : Not on file  Social Connections:   . Frequency of Communication with Friends and Family: Not on file  . Frequency of Social Gatherings with Friends and Family: Not on file  . Attends Religious Services: Not on file  . Active Member of Clubs or Organizations: Not on file  . Attends Banker Meetings: Not on file  . Marital Status: Not on file  Intimate Partner Violence:   . Fear of Current or Ex-Partner: Not on file  . Emotionally Abused: Not on file  . Physically Abused: Not on file  . Sexually Abused: Not on file   No current facility-administered medications on file prior to encounter.   Current Outpatient Medications on File Prior to Encounter  Medication Sig Dispense Refill  . ibuprofen (ADVIL,MOTRIN) 600 MG tablet Take 1 tablet (600 mg total) by mouth every 6 (six) hours as needed for up to 30 doses for moderate pain or cramping. 30 tablet 0  . oxyCODONE-acetaminophen (PERCOCET) 5-325  MG tablet Take 1-2 tablets by mouth every 6 (six) hours as needed for up to 8 doses for severe pain. 8 tablet 0  . Prenatal Vit-Fe Fumarate-FA (PRENATAL MULTIVITAMIN) TABS tablet Take 1 tablet by mouth daily at 12 noon.     . promethazine (PHENERGAN) 25 MG tablet Take 1 tablet (25 mg total) by mouth every 6 (six) hours as needed for up to 8 doses for nausea or vomiting. 8 tablet 0   Allergies  Allergen Reactions  . Shellfish Allergy Anaphylaxis  . Iodine     ROS:  Review of Systems  Constitutional: Negative for chills, fatigue and fever.  Eyes: Negative for visual disturbance.  Respiratory: Positive for shortness of breath. Negative for chest tightness.   Cardiovascular: Negative for chest pain.  Gastrointestinal: Positive for abdominal pain.  Negative for nausea and vomiting.  Genitourinary: Negative for difficulty urinating, dysuria, flank pain, pelvic pain, vaginal bleeding, vaginal discharge and vaginal pain.  Neurological: Negative for dizziness and headaches.  Psychiatric/Behavioral: Negative.      I have reviewed patient's Past Medical Hx, Surgical Hx, Family Hx, Social Hx, medications and allergies.   Physical Exam   Patient Vitals for the past 24 hrs:  BP Temp Pulse Resp SpO2 Height Weight  11/11/19 0126 (!) 170/105 -- 92 18 -- -- --  11/11/19 0123 (!) 170/105 -- -- 18 -- -- --  11/10/19 2240 (!) 184/105 -- -- -- -- -- --  11/10/19 2239 -- 98.8 F (37.1 C) 99 18 100 % 4\' 10"  (1.473 m) 82.1 kg   Constitutional: Well-developed, well-nourished female in no acute distress.  HEART: normal rate, heart sounds, regular rhythm RESP: normal effort, lung sounds clear and equal bilaterally GI: Abd soft, non-tender. Pos BS x 4 MS: Extremities nontender, no edema, normal ROM Neurologic: Alert and oriented x 4.  GU: Neg CVAT.    LAB RESULTS Results for orders placed or performed during the hospital encounter of 11/10/19 (from the past 24 hour(s))  Urinalysis, Routine w reflex microscopic     Status: Abnormal   Collection Time: 11/10/19 10:30 PM  Result Value Ref Range   Color, Urine YELLOW YELLOW   APPearance HAZY (A) CLEAR   Specific Gravity, Urine 1.019 1.005 - 1.030   pH 7.0 5.0 - 8.0   Glucose, UA NEGATIVE NEGATIVE mg/dL   Hgb urine dipstick NEGATIVE NEGATIVE   Bilirubin Urine NEGATIVE NEGATIVE   Ketones, ur NEGATIVE NEGATIVE mg/dL   Protein, ur NEGATIVE NEGATIVE mg/dL   Nitrite NEGATIVE NEGATIVE   Leukocytes,Ua TRACE (A) NEGATIVE   RBC / HPF 0-5 0 - 5 RBC/hpf   WBC, UA 11-20 0 - 5 WBC/hpf   Bacteria, UA FEW (A) NONE SEEN   Squamous Epithelial / LPF 0-5 0 - 5  Pregnancy, urine POC     Status: None   Collection Time: 11/10/19 10:55 PM  Result Value Ref Range   Preg Test, Ur NEGATIVE NEGATIVE  CBC      Status: Abnormal   Collection Time: 11/10/19 11:39 PM  Result Value Ref Range   WBC 4.2 4.0 - 10.5 K/uL   RBC 4.39 3.87 - 5.11 MIL/uL   Hemoglobin 9.7 (L) 12.0 - 15.0 g/dL   HCT 11/12/19 (L) 79.8 - 92.1 %   MCV 74.0 (L) 80.0 - 100.0 fL   MCH 22.1 (L) 26.0 - 34.0 pg   MCHC 29.8 (L) 30.0 - 36.0 g/dL   RDW 19.4 (H) 17.4 - 08.1 %   Platelets 270 150 -  400 K/uL   nRBC 0.0 0.0 - 0.2 %  hCG, quantitative, pregnancy     Status: None   Collection Time: 11/10/19 11:39 PM  Result Value Ref Range   hCG, Beta Chain, Quant, S <1 <5 mIU/mL    --/--/O POS (03/07 0750)  IMAGING No results found.  MAU Management/MDM: Orders Placed This Encounter  Procedures  . Urinalysis, Routine w reflex microscopic  . CBC  . hCG, quantitative, pregnancy  . Pregnancy, urine POC  . Discharge patient    Meds ordered this encounter  Medications  . DISCONTD: labetalol (NORMODYNE) injection 40 mg  . DISCONTD: NIFEdipine (PROCARDIA-XL/NIFEDICAL-XL) 30 MG 24 hr tablet    Sig: Take 1 tablet (30 mg total) by mouth daily.    Dispense:  30 tablet    Refill:  0    Order Specific Question:   Supervising Provider    Answer:   Despina Hidden, LUTHER H [[id number]]  . NIFEdipine (PROCARDIA-XL/NIFEDICAL-XL) 30 MG 24 hr tablet    Sig: Take 1 tablet (30 mg total) by mouth daily.    Dispense:  30 tablet    Refill:  0    Order Specific Question:   Supervising Provider    Answer:   Despina Hidden, LUTHER H [[id number]]    Pt without acute abdomen.  Pt with BP 180s/100s in MAU.  With positive home pregnancy test and negative test upon arrival in MAU, quant hcg ordered.  Pt placed in a room in MAU due to SOB and severe range BPs.  Hcg resulted as <1, discussed negative results with pt who states understanding.  Discussed options with pt including going to [facility] tonight or seeking care at Urgent Care tonight or tomorrow.  Pt is feeling Ok right now, is glad to know she is not pregnant.  She does not have a primary care but recently has new insurance so plans to  start care.  Pt took Procardia 1 year ago so Rx renewed x 30 days.  Pt to follow up with PCP.  If SOB worsens, or chest pain develops, pt to come to ED immediately.  F/U with PCP as soon as possible.    ASSESSMENT 1. Negative pregnancy test   2. Abdominal pain during pregnancy in first trimester   3. Essential hypertension   4. Shortness of breath     PLAN Discharge home Allergies as of 11/11/2019      Reactions   Shellfish Allergy Anaphylaxis   Iodine       Medication List    TAKE these medications   ibuprofen 600 MG tablet Commonly known as: ADVIL Take 1 tablet (600 mg total) by mouth every 6 (six) hours as needed for up to 30 doses for moderate pain or cramping.   NIFEdipine 30 MG 24 hr tablet Commonly known as: PROCARDIA-XL/NIFEDICAL-XL Take 1 tablet (30 mg total) by mouth daily.   oxyCODONE-acetaminophen 5-325 MG tablet Commonly known as: Percocet Take 1-2 tablets by mouth every 6 (six) hours as needed for up to 8 doses for severe pain.   prenatal multivitamin Tabs tablet Take 1 tablet by mouth daily at 12 noon.   promethazine 25 MG tablet Commonly known as: PHENERGAN Take 1 tablet (25 mg total) by mouth every 6 (six) hours as needed for up to 8 doses for nausea or vomiting.      Follow-up Information    Primary Care Provider of your choice Follow up.   Why: Or Urgent Care or Emergency Room with severe symptoms  Fatima Blank Certified Nurse-Midwife 11/11/2019  1:29 AM

## 2019-11-11 NOTE — Progress Notes (Signed)
Lisa Leftwich-Kirby CNM in earlier to discuss test results and d/c plan. Written and verbal d/c instructions given and understanding voiced. 

## 2019-11-16 ENCOUNTER — Ambulatory Visit: Payer: BC Managed Care – PPO | Attending: Internal Medicine

## 2019-11-16 DIAGNOSIS — Z20822 Contact with and (suspected) exposure to covid-19: Secondary | ICD-10-CM

## 2019-11-17 LAB — NOVEL CORONAVIRUS, NAA: SARS-CoV-2, NAA: NOT DETECTED

## 2020-01-21 ENCOUNTER — Other Ambulatory Visit: Payer: Self-pay

## 2020-01-21 ENCOUNTER — Emergency Department (HOSPITAL_COMMUNITY)
Admission: EM | Admit: 2020-01-21 | Discharge: 2020-01-21 | Disposition: A | Discharge status: Home / Self Care | Payer: BC Managed Care – PPO | Attending: Emergency Medicine | Admitting: Emergency Medicine

## 2020-01-21 ENCOUNTER — Encounter (HOSPITAL_COMMUNITY): Payer: Self-pay | Admitting: Pediatrics

## 2020-01-21 DIAGNOSIS — I10 Essential (primary) hypertension: Secondary | ICD-10-CM | POA: Diagnosis not present

## 2020-01-21 DIAGNOSIS — R197 Diarrhea, unspecified: Secondary | ICD-10-CM | POA: Diagnosis not present

## 2020-01-21 DIAGNOSIS — R1033 Periumbilical pain: Secondary | ICD-10-CM | POA: Diagnosis not present

## 2020-01-21 DIAGNOSIS — R112 Nausea with vomiting, unspecified: Secondary | ICD-10-CM | POA: Diagnosis not present

## 2020-01-21 LAB — URINALYSIS, ROUTINE W REFLEX MICROSCOPIC
Bilirubin Urine: NEGATIVE
Glucose, UA: NEGATIVE mg/dL
Hgb urine dipstick: NEGATIVE
Ketones, ur: 5 mg/dL — AB
Nitrite: POSITIVE — AB
Protein, ur: 100 mg/dL — AB
Specific Gravity, Urine: 1.028 (ref 1.005–1.030)
pH: 5 (ref 5.0–8.0)

## 2020-01-21 LAB — COMPREHENSIVE METABOLIC PANEL
ALT: 16 U/L (ref 0–44)
AST: 18 U/L (ref 15–41)
Albumin: 3.8 g/dL (ref 3.5–5.0)
Alkaline Phosphatase: 56 U/L (ref 38–126)
Anion gap: 13 (ref 5–15)
BUN: 17 mg/dL (ref 6–20)
CO2: 20 mmol/L — ABNORMAL LOW (ref 22–32)
Calcium: 9 mg/dL (ref 8.9–10.3)
Chloride: 105 mmol/L (ref 98–111)
Creatinine, Ser: 0.75 mg/dL (ref 0.44–1.00)
GFR calc Af Amer: >60 mL/min (ref >60)
GFR calc non Af Amer: >60 mL/min (ref >60)
Glucose, Bld: 146 mg/dL — ABNORMAL HIGH (ref 70–99)
Potassium: 3.7 mmol/L (ref 3.5–5.1)
Sodium: 138 mmol/L (ref 135–145)
Total Bilirubin: 0.8 mg/dL (ref 0.3–1.2)
Total Protein: 7.8 g/dL (ref 6.5–8.1)

## 2020-01-21 LAB — CBC
HCT: 35.8 % — ABNORMAL LOW (ref 36.0–46.0)
Hemoglobin: 10.6 g/dL — ABNORMAL LOW (ref 12.0–15.0)
MCH: 23.7 pg — ABNORMAL LOW (ref 26.0–34.0)
MCHC: 29.6 g/dL — ABNORMAL LOW (ref 30.0–36.0)
MCV: 79.9 fL — ABNORMAL LOW (ref 80.0–100.0)
Platelets: 353 10*3/uL (ref 150–400)
RBC: 4.48 MIL/uL (ref 3.87–5.11)
RDW: 18.8 % — ABNORMAL HIGH (ref 11.5–15.5)
WBC: 7.3 10*3/uL (ref 4.0–10.5)
nRBC: 0 % (ref 0.0–0.2)

## 2020-01-21 LAB — LIPASE, BLOOD: Lipase: 21 U/L (ref 11–51)

## 2020-01-21 LAB — I-STAT BETA HCG BLOOD, ED (MC, WL, AP ONLY): I-stat hCG, quantitative: <5 m[IU]/mL (ref <5)

## 2020-01-21 MED ORDER — ONDANSETRON 4 MG PO TBDP
4.0000 mg | ORAL_TABLET | Freq: Once | ORAL | Status: AC
  Administered 2020-01-21: 4 mg via ORAL
  Filled 2020-01-21: qty 1

## 2020-01-21 MED ORDER — ONDANSETRON HCL 4 MG PO TABS: 4.0000 mg | ORAL_TABLET | Freq: Four times a day (QID) | ORAL | 0 refills | Status: DC | PRN

## 2020-01-21 MED ORDER — SODIUM CHLORIDE 0.9% FLUSH: 3.0000 mL | Freq: Once | INTRAVENOUS | Status: DC

## 2020-01-21 NOTE — ED Notes (Signed)
Pt was discharged from the ED. Pt read and understood discharge paperwork. Pt had vital signs completed. Pt conscious, breathing, and A&Ox4. No distress noted. Pt speaking in complete sentences. Pt ambulated out of the ED with a smooth and steady gait. E-signature not available.  

## 2020-01-21 NOTE — ED Provider Notes (Signed)
[facility] EMERGENCY DEPARTMENT Provider Note   CSN: [id number] Arrival date & time: 01/21/20  1446     History Chief Complaint  Patient presents with  . Abdominal Pain  . Diarrhea  . Nausea    Alice Hays is a 39 y.o. female.  HPI Patient is a 38 year old female with a PMH of hypertension presenting to the ED today due to abdominal pain, vomiting and diarrhea.  Patient reports that all of her symptoms began today.  She has had 2 episodes of vomiting since 1000 and it has been clear, nonbloody and nonbilious.  She has experienced sharp, periumbilical, nonradiating pain that began as an 8/10 in severity.  Patient says that it has mildly improved to a 6/10 without intervention.  She has had about 5 episodes of loose, watery diarrhea.  She endorses some nausea but denies fever, chills, headache, chest pain, shortness of breath, dysuria, vaginal bleeding or discharge.    Past Medical History:  Diagnosis Date  . Anemia   . Ectopic pregnancy    L adnexa  . Hx of varicella   . Hypertension     Patient Active Problem List   Diagnosis Date Noted  . Essential hypertension 01/02/2019  . Labor abnormal 06/29/2016  .  Postpartum care s/p cesarean section (9/2) 06/29/2016    Past Surgical History:  Procedure Laterality Date  . ARM SURGERY    . CESAREAN SECTION N/A 06/29/2016   Procedure: CESAREAN SECTION;  Surgeon: Maxie Better, MD;  Location: [facility];  Service: Obstetrics;  Laterality: N/A;     OB History    Gravida  4   Para  1   Term  1   Preterm      AB  1   Living  1     SAB      TAB      Ectopic  1   Multiple  0   Live Births  1           Family History  Problem Relation Age of Onset  . Cancer Mother   . Heart disease Mother   . Heart disease Father   . Hypertension Father   . Stroke Father   . Cancer Maternal Aunt   . Diabetes Maternal Grandmother     Social History   Tobacco Use  . Smoking  status: Never Smoker  . Smokeless tobacco: Never Used  Substance Use Topics  . Alcohol use: No  . Drug use: No    Home Medications Prior to Admission medications   Medication Sig Start Date End Date Taking? Authorizing Provider  ibuprofen (ADVIL,MOTRIN) 600 MG tablet Take 1 tablet (600 mg total) by mouth every 6 (six) hours as needed for up to 30 doses for moderate pain or cramping. 01/02/19   Nugent, Odie Sera, NP  NIFEdipine (PROCARDIA-XL/NIFEDICAL-XL) 30 MG 24 hr tablet Take 1 tablet (30 mg total) by mouth daily. 11/11/19 01/10/20  Leftwich-Kirby, Wilmer Floor, CNM  oxyCODONE-acetaminophen (PERCOCET) 5-325 MG tablet Take 1-2 tablets by mouth every 6 (six) hours as needed for up to 8 doses for severe pain. 01/02/19   Nugent, Odie Sera, NP  Prenatal Vit-Fe Fumarate-FA (PRENATAL MULTIVITAMIN) TABS tablet Take 1 tablet by mouth daily at 12 noon.     [provider]  promethazine (PHENERGAN) 25 MG tablet Take 1 tablet (25 mg total) by mouth every 6 (six) hours as needed for up to 8 doses for nausea or vomiting. 01/02/19   Nugent,  Odie Sera, NP    Allergies    Shellfish allergy and Iodine  Review of Systems   Review of Systems  Constitutional: Negative for chills and fever.  HENT: Negative for rhinorrhea and sore throat.   Eyes: Negative for pain and visual disturbance.  Respiratory: Negative for cough and shortness of breath.   Cardiovascular: Negative for chest pain and palpitations.  Gastrointestinal: Positive for abdominal pain, diarrhea, nausea and vomiting. Negative for blood in stool.  Genitourinary: Negative for dysuria, hematuria, urgency, vaginal bleeding and vaginal discharge.  Musculoskeletal: Negative for arthralgias and back pain.  Skin: Negative for color change and rash.  Neurological: Negative for syncope, weakness and light-headedness.  Psychiatric/Behavioral: Negative for agitation.  All other systems reviewed and are negative.   Physical Exam Updated Vital Signs BP  110/73 (BP Location: Right Arm)   Pulse 87   Temp 98.2 F (36.8 C) (Oral)   Resp 16   SpO2 95%   Physical Exam Vitals and nursing note reviewed.  Constitutional:      General: She is not in acute distress.    Appearance: Normal appearance. She is well-developed. She is obese. She is not ill-appearing.  HENT:     Head: Normocephalic and atraumatic.     Nose: Nose normal. No congestion or rhinorrhea.     Mouth/Throat:     Mouth: Mucous membranes are moist.     Pharynx: Oropharynx is clear.  Eyes:     Extraocular Movements: Extraocular movements intact.     Pupils: Pupils are equal, round, and reactive to light.  Cardiovascular:     Rate and Rhythm: Normal rate and regular rhythm.     Pulses: Normal pulses.     Heart sounds: Normal heart sounds.  Pulmonary:     Effort: Pulmonary effort is normal. No respiratory distress.     Breath sounds: Normal breath sounds.  Abdominal:     General: There is no distension.     Palpations: Abdomen is soft.     Tenderness: There is abdominal tenderness (mild, epigastric). There is no right CVA tenderness, left CVA tenderness, guarding or rebound.  Musculoskeletal:        General: Normal range of motion.     Cervical back: Normal range of motion and neck supple.     Right lower leg: No edema.     Left lower leg: No edema.  Skin:    General: Skin is warm and dry.     Capillary Refill: Capillary refill takes less than 2 seconds.  Neurological:     General: No focal deficit present.     Mental Status: She is alert and oriented to person, place, and time. Mental status is at baseline.  Psychiatric:        Mood and Affect: Mood normal.     ED Results / Procedures / Treatments   Labs (all labs ordered are listed, but only abnormal results are displayed) Labs Reviewed  COMPREHENSIVE METABOLIC PANEL - Abnormal; Notable for the following components:      Result Value   CO2 20 (*)    Glucose, Bld 146 (*)    All other components within  normal limits  CBC - Abnormal; Notable for the following components:   Hemoglobin 10.6 (*)    HCT 35.8 (*)    MCV 79.9 (*)    MCH 23.7 (*)    MCHC 29.6 (*)    RDW 18.8 (*)    All other components within normal limits  URINALYSIS,  ROUTINE W REFLEX MICROSCOPIC - Abnormal; Notable for the following components:   APPearance HAZY (*)    Ketones, ur 5 (*)    Protein, ur 100 (*)    Nitrite POSITIVE (*)    Leukocytes,Ua TRACE (*)    Bacteria, UA MANY (*)    All other components within normal limits  URINE CULTURE  LIPASE, BLOOD  I-STAT BETA HCG BLOOD, ED ([facility], [facility], [facility] ONLY)    EKG None  Radiology No results found.  Procedures Procedures (including critical care time)  Medications Ordered in ED Medications  sodium chloride flush (NS) 0.9 % injection 3 mL (has no administration in time range)  ondansetron (ZOFRAN-ODT) disintegrating tablet 4 mg (4 mg Oral Given 01/21/20 2131)    ED Course  I have reviewed the triage vital signs and the nursing notes.  Pertinent labs & imaging results that were available during my care of the patient were reviewed by me and considered in my medical decision making (see chart for details).    MDM Rules/Calculators/A&P                     Patient is a 39 year old female with a PMH of hypertension presenting to the ED today due to abdominal pain, vomiting and diarrhea that began this morning.  On exam, patient has mild epigastric tenderness to palpation.  Vital signs stable.  Afebrile.  On arrival, patient appears generally well and is displaying no signs of acute distress.  Her abdominal tenderness is mild and localized to the epigastric region.  She has no abdominal distention, rebound or guarding.  Lab work collected in triage.  CBC with hemoglobin 10.6 which is improved from previous.  CMP and lipase unremarkable.  Beta hCG undetectable.  Urinalysis shows 5 ketones, 100 protein, positive nitrites, trace leukocytes and many bacteria.  The sample does  appear markedly contaminated.  Patient given Zofran and able to tolerate p.o. intake.  On reassessment, patient has had no worsening of symptoms.  Based off of lab work and presentation, low suspicion for appendicitis, cholecystitis, pancreatitis, diverticulitis, cervicitis or ovarian pathology.  She is likely experiencing viral gastroenteritis.  Patient's urinalysis mildly concerning for infection; however, she has no urinary symptoms so we will not treat this immediately.  Urine culture ordered.  If culture is positive, she will be contacted for antibiotic treatment.  Patient prescribed Zofran for nausea.  Encouraged oral hydration.  At this time, patient appears stable for discharge.  Provided strict return precautions.  Encouraged her to follow-up with her PCP if symptoms worsen.  Patient assessed and evaluated with Dr. Tamera Punt.  Nadeen Landau, MD   Final Clinical Impression(s) / ED Diagnoses Final diagnoses:  Diarrhea, unspecified type  Nausea and vomiting, intractability of vomiting not specified, unspecified vomiting type    Rx / DC Orders ED Discharge Orders         Ordered    ondansetron (ZOFRAN) 4 MG tablet  Every 6 hours PRN     01/21/20 2150           Nadeen Landau, MD 01/22/20 Quentin Mulling    Malvin Johns, MD 01/22/20 2049

## 2020-01-21 NOTE — ED Triage Notes (Signed)
C/o abdominal pain, N/V + diarrhea x 4 episodes today

## 2020-01-24 LAB — URINE CULTURE: Culture: >=100000 — AB

## 2020-01-25 ENCOUNTER — Telehealth: Payer: Self-pay | Admitting: *Deleted

## 2020-01-25 NOTE — Telephone Encounter (Signed)
Post ED Visit - Positive Culture Follow-up  Culture report reviewed by antimicrobial stewardship pharmacist: [facility] []  , Pharm.D. []  Enzo Bi, Pharm.D., BCPS AQ-ID []  , Pharm.D., BCPS []  Celedonio Miyamoto, .D., BCPS []  [place], .D., BCPS, AAHIVP []  Georgina Pillion, Pharm.D., BCPS, AAHIVP []  [address], PharmD, BCPS []  , PharmD, BCPS []  [place], PharmD, BCPS []  [address], PharmD []  , PharmD, BCPS []  Estella Husk, PharmD Pharmacy Team []  Lysle Pearl, PharmD []  , PharmD []  Phillips Climes, PharmD []  , Rph []  Agapito Games) , PharmD []  Verlan Friends, PharmD []  , PharmD []  Mervyn Gay, PharmD []  , PharmD []  Vinnie Level, PharmD []  Lollie Sails, PharmD []  , PharmD []  Len Childs, PharmD   Positive urine culture Likely asymptomatic bacteriuria and no further patient follow-up is required at this time.  [facility] 01/25/2020, 10:41 AM

## 2021-01-08 ENCOUNTER — Inpatient Hospital Stay (HOSPITAL_COMMUNITY)
Admission: AD | Admit: 2021-01-08 | Discharge: 2021-01-08 | Disposition: A | Discharge status: Home / Self Care | Payer: BC Managed Care – PPO | Attending: Obstetrics & Gynecology | Admitting: Obstetrics & Gynecology

## 2021-01-08 ENCOUNTER — Other Ambulatory Visit: Payer: Self-pay

## 2021-01-08 ENCOUNTER — Inpatient Hospital Stay (HOSPITAL_COMMUNITY): Payer: BC Managed Care – PPO

## 2021-01-08 ENCOUNTER — Encounter (HOSPITAL_COMMUNITY): Payer: Self-pay | Admitting: Obstetrics and Gynecology

## 2021-01-08 DIAGNOSIS — O161 Unspecified maternal hypertension, first trimester: Secondary | ICD-10-CM | POA: Diagnosis not present

## 2021-01-08 DIAGNOSIS — O2341 Unspecified infection of urinary tract in pregnancy, first trimester: Secondary | ICD-10-CM | POA: Diagnosis not present

## 2021-01-08 DIAGNOSIS — O99891 Other specified diseases and conditions complicating pregnancy: Secondary | ICD-10-CM

## 2021-01-08 DIAGNOSIS — R109 Unspecified abdominal pain: Secondary | ICD-10-CM | POA: Diagnosis not present

## 2021-01-08 DIAGNOSIS — O10911 Unspecified pre-existing hypertension complicating pregnancy, first trimester: Secondary | ICD-10-CM | POA: Diagnosis not present

## 2021-01-08 DIAGNOSIS — Z3A08 8 weeks gestation of pregnancy: Secondary | ICD-10-CM | POA: Diagnosis not present

## 2021-01-08 DIAGNOSIS — R1030 Lower abdominal pain, unspecified: Secondary | ICD-10-CM | POA: Diagnosis not present

## 2021-01-08 DIAGNOSIS — O26899 Other specified pregnancy related conditions, unspecified trimester: Secondary | ICD-10-CM

## 2021-01-08 DIAGNOSIS — Z3A09 9 weeks gestation of pregnancy: Secondary | ICD-10-CM | POA: Insufficient documentation

## 2021-01-08 DIAGNOSIS — O09521 Supervision of elderly multigravida, first trimester: Secondary | ICD-10-CM | POA: Insufficient documentation

## 2021-01-08 DIAGNOSIS — O26891 Other specified pregnancy related conditions, first trimester: Secondary | ICD-10-CM | POA: Diagnosis not present

## 2021-01-08 LAB — CBC
HCT: 32.5 % — ABNORMAL LOW (ref 36.0–46.0)
Hemoglobin: 10.4 g/dL — ABNORMAL LOW (ref 12.0–15.0)
MCH: 25.7 pg — ABNORMAL LOW (ref 26.0–34.0)
MCHC: 32 g/dL (ref 30.0–36.0)
MCV: 80.4 fL (ref 80.0–100.0)
Platelets: 267 10*3/uL (ref 150–400)
RBC: 4.04 MIL/uL (ref 3.87–5.11)
RDW: 15.8 % — ABNORMAL HIGH (ref 11.5–15.5)
WBC: 7.5 10*3/uL (ref 4.0–10.5)
nRBC: 0 % (ref 0.0–0.2)

## 2021-01-08 LAB — URINALYSIS, ROUTINE W REFLEX MICROSCOPIC
Bilirubin Urine: NEGATIVE
Glucose, UA: NEGATIVE mg/dL
Ketones, ur: NEGATIVE mg/dL
Nitrite: POSITIVE — AB
Protein, ur: NEGATIVE mg/dL
Specific Gravity, Urine: 1.01 (ref 1.005–1.030)
pH: 7 (ref 5.0–8.0)

## 2021-01-08 LAB — WET PREP, GENITAL
Clue Cells Wet Prep HPF POC: NONE SEEN
Sperm: NONE SEEN
Trich, Wet Prep: NONE SEEN
Yeast Wet Prep HPF POC: NONE SEEN

## 2021-01-08 LAB — POCT PREGNANCY, URINE: Preg Test, Ur: POSITIVE — AB

## 2021-01-08 LAB — HCG, QUANTITATIVE, PREGNANCY: hCG, Beta Chain, Quant, S: 76751 m[IU]/mL — ABNORMAL HIGH (ref <5)

## 2021-01-08 MED ORDER — CEFADROXIL 500 MG PO CAPS: 500.0000 mg | ORAL_CAPSULE | Freq: Two times a day (BID) | ORAL | 0 refills | Status: DC

## 2021-01-08 MED ORDER — LABETALOL HCL 100 MG PO TABS
200.0000 mg | ORAL_TABLET | Freq: Once | ORAL | Status: AC
  Administered 2021-01-08: 200 mg via ORAL
  Filled 2021-01-08: qty 2

## 2021-01-08 MED ORDER — NIFEDIPINE ER OSMOTIC RELEASE 30 MG PO TB24: 30.0000 mg | ORAL_TABLET | Freq: Every day | ORAL | 0 refills | Status: DC

## 2021-01-08 NOTE — MAU Provider Note (Signed)
Chief Complaint:  Abdominal Pain   Event Date/Time   First Provider Initiated Contact with Patient 01/08/21 2137     HPI: Alice Hays is a 40 y.o. [id number] at [redacted]w[redacted]d who presents to maternity admissions reporting lower abdominal pain but no vaginal bleeding. She had a positive HPT, has a history of and is concerned about ectopic pregnancy. The pain began earlier today and is not aggravated or relieved by anything. Denies urinary symptoms other than frequency. Does not feel the pain is associated with urination.   Pregnancy Course: Pt said she receives care at [facility], Dr. Billy Coast said she is not established with this pregnancy and has not been seen at their office since 2020. Currently has untreated HTN as her insurance lapsed last year and she has not gotten reestablished with a PCP or OB.  Past Medical History:  Diagnosis Date  . Anemia   . Ectopic pregnancy    L adnexa  . Hx of varicella   . Hypertension    OB History  Gravida Para Term Preterm AB Living  5 1 1   3 1   SAB IAB Ectopic Multiple Live Births  1   2 0 1    # Outcome Date GA Lbr Len/2nd Weight Sex Delivery Anes PTL Lv  5 Current           4 SAB 2020          3 Term 06/29/16 [redacted]w[redacted]d  7 lb 3.7 oz (3.28 kg) F CS-LTranv EPI  LIV  2 Ectopic 2016          1 Ectopic 02/2014           Past Surgical History:  Procedure Laterality Date  . ARM SURGERY    . CESAREAN SECTION N/A 06/29/2016   Procedure: CESAREAN SECTION;  Surgeon: 08/29/2016, MD;  Location: [facility];  Service: Obstetrics;  Laterality: N/A;   Family History  Problem Relation Age of Onset  . Cancer Mother   . Heart disease Mother   . Heart disease Father   . Hypertension Father   . Stroke Father   . Cancer Maternal Aunt   . Diabetes Maternal Grandmother    Social History   Tobacco Use  . Smoking status: Never Smoker  . Smokeless tobacco: Never Used  Vaping Use  . Vaping Use: Never used  Substance Use Topics  . Alcohol use:  No  . Drug use: No   Allergies  Allergen Reactions  . Shellfish Allergy Anaphylaxis  . Iodine    No medications prior to admission.   I have reviewed patient's Past Medical Hx, Surgical Hx, Family Hx, Social Hx, medications and allergies.   ROS:  Review of Systems  Constitutional: Negative for fatigue and fever.  HENT: Negative for congestion and sore throat.   Eyes: Positive for visual disturbance (occasionally blurred vision). Negative for photophobia.  Gastrointestinal: Positive for abdominal pain (very low and centralized). Negative for nausea and vomiting.  Genitourinary: Positive for urgency. Negative for flank pain, hematuria, vaginal bleeding and vaginal pain.  Neurological: Negative for dizziness, syncope and headaches.   Physical Exam   Patient Vitals for the past 24 hrs:  BP Temp Pulse Resp SpO2 Height Weight  01/08/21 2222 (!) 180/93 -- 91 14 100 % -- --  01/08/21 2116 (!) 169/83 -- 94 -- -- -- --  01/08/21 2109 (!) 185/99 -- (!) 104 -- -- -- --  01/08/21 2008 (!) 172/101 98.7 F (37.1 C) 98  18 -- 4\' 11"  (1.499 m) 190 lb (86.2 kg)   Constitutional: Well-developed, well-nourished female in no acute distress.  Cardiovascular: normal rate & rhythm, no murmur Respiratory: normal effort, lung sounds clear throughout GI: Abd soft, non-tender, gravid appropriate for gestational age. Pos BS x 4 MS: Extremities nontender, no edema, normal ROM Neurologic: Alert and oriented x 4.  GU: no CVA tenderness Pelvic: NEFG, physiologic discharge, no blood - blind swabs obtained by RN  FHR: 171   Labs: Results for orders placed or performed during the hospital encounter of 01/08/21 (from the past 24 hour(s))  Pregnancy, urine POC     Status: Abnormal   Collection Time: 01/08/21  7:38 PM  Result Value Ref Range   Preg Test, Ur POSITIVE (A) NEGATIVE  Urinalysis, Routine w reflex microscopic     Status: Abnormal   Collection Time: 01/08/21  8:22 PM  Result Value Ref Range    Color, Urine YELLOW YELLOW   APPearance HAZY (A) CLEAR   Specific Gravity, Urine 1.010 1.005 - 1.030   pH 7.0 5.0 - 8.0   Glucose, UA NEGATIVE NEGATIVE mg/dL   Hgb urine dipstick SMALL (A) NEGATIVE   Bilirubin Urine NEGATIVE NEGATIVE   Ketones, ur NEGATIVE NEGATIVE mg/dL   Protein, ur NEGATIVE NEGATIVE mg/dL   Nitrite POSITIVE (A) NEGATIVE   Leukocytes,Ua LARGE (A) NEGATIVE   RBC / HPF 0-5 0 - 5 RBC/hpf   WBC, UA 11-20 0 - 5 WBC/hpf   Bacteria, UA MANY (A) NONE SEEN   Squamous Epithelial / LPF 0-5 0 - 5  CBC     Status: Abnormal   Collection Time: 01/08/21  8:39 PM  Result Value Ref Range   WBC 7.5 4.0 - 10.5 K/uL   RBC 4.04 3.87 - 5.11 MIL/uL   Hemoglobin 10.4 (L) 12.0 - 15.0 g/dL   HCT 01/10/21 (L) 70.9 - 62.8 %   MCV 80.4 80.0 - 100.0 fL   MCH 25.7 (L) 26.0 - 34.0 pg   MCHC 32.0 30.0 - 36.0 g/dL   RDW 36.6 (H) 29.4 - 76.5 %   Platelets 267 150 - 400 K/uL   nRBC 0.0 0.0 - 0.2 %  hCG, quantitative, pregnancy     Status: Abnormal   Collection Time: 01/08/21  8:39 PM  Result Value Ref Range   hCG, Beta Chain, Quant, S 76,751 (H) <5 mIU/mL  Wet prep, genital     Status: Abnormal   Collection Time: 01/08/21  9:38 PM   Specimen: Vaginal  Result Value Ref Range   Yeast Wet Prep HPF POC NONE SEEN NONE SEEN   Trich, Wet Prep NONE SEEN NONE SEEN   Clue Cells Wet Prep HPF POC NONE SEEN NONE SEEN   WBC, Wet Prep HPF POC FEW (A) NONE SEEN   Sperm NONE SEEN    Imaging:  01/10/21 OB Comp Less 14 Wks  Result Date: 01/08/2021 CLINICAL DATA:  Lower abdominal pain x2 days. EXAM: OBSTETRIC <14 WK ULTRASOUND TECHNIQUE: Transabdominal ultrasound was performed for evaluation of the gestation as well as the maternal uterus and adnexal regions. COMPARISON:  None. FINDINGS: Intrauterine gestational sac: Single Yolk sac:  Not Visualized. Embryo:  Visualized. Cardiac Activity: Visualized. Heart Rate: 171 bpm CRL:   18.7 mm   8 w 2 d                  01/10/2021 EDC: August 18, 2021 Subchorionic hemorrhage:  None  visualized. Maternal uterus/adnexae: The right and left ovaries  are visualized and are normal in appearance. No pelvic free fluid is seen. IMPRESSION: Single, viable intrauterine pregnancy at approximately 8 weeks and 2 days gestation by ultrasound evaluation. Electronically Signed   By: Aram Candela M.D.   On: 01/08/2021 22:16   MAU Course: Orders Placed This Encounter  Procedures  . Wet prep, genital  . US OB Comp Less 14 Wks  . Urinalysis, Routine w reflex microscopic  . CBC  . hCG, quantitative, pregnancy  . Pregnancy, urine POC  . Discharge patient   Meds ordered this encounter  Medications  . NIFEdipine (PROCARDIA-XL/NIFEDICAL-XL) 30 MG 24 hr tablet    Sig: Take 1 tablet (30 mg total) by mouth daily.    Dispense:  30 tablet    Refill:  0    Order Specific Question:   Supervising Provider    Answer:   Reva Bores [[id number]]  . cefadroxil (DURICEF) 500 MG capsule    Sig: Take 1 capsule (500 mg total) by mouth 2 (two) times daily.    Dispense:  14 capsule    Refill:  0    Order Specific Question:   Supervising Provider    Answer:   Reva Bores [[id number]]  . labetalol (NORMODYNE) tablet 200 mg    MDM: Reviewed lab work and ultrasound with patient. Gave reassurance that her pregnancy appears to be developing normally without ectopic concerns. Pain likely related to UTI, will send prescription. Pt planning to reestablish care with [facility] or Dr. Cherly Hensen. Will refill procardia prescription for pt to take until she reestablishes care.   One dose of po labetalol given prior to discharge   Assessment: 1. UTI (urinary tract infection) during pregnancy, first trimester   2. Abdominal pain affecting pregnancy   3. Hypertension affecting pregnancy in first trimester    Plan: Discharge home in stable condition with first trimester precautions.     Follow-up Information    Obgyn, [facility] [employer] [facility]. Call.   Why: to establish OB care as soon as possible Contact information: [address] [place] [address] [phone]               Allergies as of 01/08/2021      Reactions   Shellfish Allergy Anaphylaxis   Iodine       Medication List    TAKE these medications   cefadroxil 500 MG capsule Commonly known as: DURICEF Take 1 capsule (500 mg total) by mouth 2 (two) times daily.   ibuprofen 600 MG tablet Commonly known as: ADVIL Take 1 tablet (600 mg total) by mouth every 6 (six) hours as needed for up to 30 doses for moderate pain or cramping.   NIFEdipine 30 MG 24 hr tablet Commonly known as: PROCARDIA-XL/NIFEDICAL-XL Take 1 tablet (30 mg total) by mouth daily.   ondansetron 4 MG tablet Commonly known as: ZOFRAN Take 1 tablet (4 mg total) by mouth every 6 (six) hours as needed for nausea or vomiting.   oxyCODONE-acetaminophen 5-325 MG tablet Commonly known as: Percocet Take 1-2 tablets by mouth every 6 (six) hours as needed for up to 8 doses for severe pain.   prenatal multivitamin Tabs tablet Take 1 tablet by mouth daily at 12 noon.   promethazine 25 MG tablet Commonly known as: PHENERGAN Take 1 tablet (25 mg total) by mouth every 6 (six) hours as needed for up to 8 doses for nausea or vomiting.      Edd Arbour, CNM, MSN, IBCLC Certified Nurse Midwife, [facility]   ,

## 2021-01-08 NOTE — Discharge Instructions (Signed)
Pregnancy and Urinary Tract Infection  A urinary tract infection (UTI) is an infection of any part of the urinary tract. This includes the kidneys, the tubes that connect your kidneys to your bladder (ureters), the bladder, and the tube that carries urine out of your body (urethra). These organs make, store, and get rid of urine in the body. Your health care provider may use other names to describe the infection. An upper UTI affects the ureters and kidneys (pyelonephritis). A lower UTI affects the bladder (cystitis) and urethra (urethritis). Most urinary tract infections are caused by bacteria in your genital area, around the entrance to your urinary tract (urethra). These bacteria grow and cause irritation and inflammation of your urinary tract. You are more likely to develop a UTI during pregnancy because the physical and hormonal changes your body goes through can make it easier for bacteria to get into your urinary tract. Your growing baby also puts pressure on your bladder and can affect urine flow. It is important to recognize and treat UTIs in pregnancy because of the risk of serious complications for both you and your baby. How does this affect me? Symptoms of a UTI include:  Needing to urinate right away (urgently).  Frequent urination or passing small amounts of urine frequently.  Pain or burning with urination.  Blood in the urine.  Urine that smells bad or unusual.  Trouble urinating.  Cloudy urine.  Pain in the abdomen or lower back.  Vaginal discharge. You may also have:  Vomiting or a decreased appetite.  Confusion.  Irritability or tiredness.  A fever.  Diarrhea. How does this affect my baby? An untreated UTI during pregnancy could lead to a kidney infection or a systemic infection, which can cause health problems that could affect your baby. Possible complications of an untreated UTI include:  Giving birth to your baby before 37 weeks of pregnancy  (premature).  Having a baby with a low birth weight.  Developing high blood pressure during pregnancy (preeclampsia).  Having a low hemoglobin level (anemia). What can I do to lower my risk? To prevent a UTI:  Go to the bathroom as soon as you feel the need. Do not hold urine for long periods of time.  Always wipe from front to back, especially after a bowel movement. Use each tissue one time when you wipe.  Empty your bladder after sex.  Keep your genital area dry.  Drink 6-10 glasses of water each day.  Do not douche or use deodorant sprays. How is this treated? Treatment for this condition may include:  Antibiotic medicines that are safe to take during pregnancy.  Other medicines to treat less common causes of UTI. Follow these instructions at home:  If you were prescribed an antibiotic medicine, take it as told by your health care provider. Do not stop using the antibiotic even if you start to feel better.  Keep all follow-up visits as told by your health care provider. This is important. Contact a health care provider if:  Your symptoms do not improve or they get worse.  You have abnormal vaginal discharge. Get help right away if you:  Have a fever.  Have nausea and vomiting.  Have back or side pain.  Feel contractions in your uterus.  Have lower belly pain.  Have a gush of fluid from your vagina.  Have blood in your urine. Summary  A urinary tract infection (UTI) is an infection of any part of the urinary tract, which includes the   kidneys, ureters, bladder, and urethra.  Most urinary tract infections are caused by bacteria in your genital area, around the entrance to your urinary tract (urethra).  You are more likely to develop a UTI during pregnancy.  If you were prescribed an antibiotic medicine, take it as told by your health care provider. Do not stop using the antibiotic even if you start to feel better. This information is not intended to  replace advice given to you by your health care provider. Make sure you discuss any questions you have with your health care provider. Document Revised: 02/05/2019 Document Reviewed: 09/17/2018 Elsevier Patient Education  2021 [employer] Hypertension During Pregnancy Hypertension is also called high blood pressure. High blood pressure means that the force of the blood moving in your body is high enough to cause problems for you and your baby. Different types of high blood pressure can happen during pregnancy. The types are:  High blood pressure before you got pregnant. This is called chronic hypertension.  This can continue during your pregnancy. Your doctor will want to keep checking your blood pressure. You may need medicine to control your blood pressure while you are pregnant. You will need follow-up visits after you have your baby.  High blood pressure that goes up during pregnancy when it was normal before. This is called gestational hypertension. It will often get better after you have your baby, but your doctor will need to watch your blood pressure to make sure that it is getting better.  You may develop high blood pressure after giving birth. This is called postpartum hypertension. This often occurs within 48 hours after childbirth but may occur up to 6 weeks after giving birth. Very high blood pressure during pregnancy is an emergency that needs treatment right away. How does this affect me? If you have high blood pressure during pregnancy, you have a higher chance of developing high blood pressure:  As you get older.  If you get pregnant again. In some cases, high blood pressure during pregnancy can cause:  Stroke.  Heart attack.  Damage to the kidneys, lungs, or liver.  Preeclampsia.  HELLP syndrome.  Seizures.  Problems with the placenta. How does this affect my baby? Your baby may:  Be born early.  Not weigh as much as he or she should.  Not handle labor  well, leading to a C-section. This condition may also result in a baby's death before birth (stillbirth). What are the risks?  Having high blood pressure during a past pregnancy.  Being overweight.  Being age 70 or older.  Being pregnant for the first time.  Being pregnant with more than one baby.  Becoming pregnant using fertility methods, such as IVF.  Having other problems, such as diabetes or kidney disease. What can I do to lower my risk?  Keep a healthy weight.  Eat a healthy diet.  Follow what your doctor tells you about treating any medical problems that you had before you got pregnant. It is very important to go to all of your doctor visits. Your doctor will check your blood pressure and make sure that your pregnancy is progressing as it should. Treatment should start early if a problem is found.   How is this treated? Treatment for high blood pressure during pregnancy can vary. It depends on the type of high blood pressure you have and how serious it is.  If you were taking medicine for your blood pressure before you got pregnant, talk with your doctor.  You may need to change the medicine during pregnancy if it is not safe for your baby.  If your blood pressure goes up during pregnancy, your doctor may order medicine to treat this.  If you are at risk for preeclampsia, your doctor may tell you to take a low-dose aspirin while you are pregnant.  If you have very high blood pressure, you may need to stay in the hospital so you and your baby can be watched closely. You may also need to take medicine to lower your blood pressure.  In some cases, if your condition gets worse, you may need to have your baby early. Follow these instructions at home: Eating and drinking  Drink enough fluid to keep your pee (urine) pale yellow.  Avoid caffeine.   Lifestyle  Do not smoke or use any products that contain nicotine or tobacco. If you need help quitting, ask your  doctor.  Do not use alcohol or drugs.  Avoid stress.  Rest and get plenty of sleep.  Regular exercise can help. Ask your doctor what kinds of exercise are best for you. General instructions  Take over-the-counter and prescription medicines only as told by your doctor.  Keep all prenatal and follow-up visits. Contact a doctor if:  You have symptoms that your doctor told you to watch for, such as: ? Headaches. ? A feeling like you may vomit (nausea). ? Vomiting. ? Belly (abdominal) pain. ? Feeling dizzy or light-headed. Get help right away if:  You have symptoms of serious problems, such as: ? Very bad belly pain that does not get better with treatment. ? A very bad headache that does not get better. ? Blurry vision. ? Double vision. ? Vomiting that does not get better. ? Sudden, fast weight gain. ? Sudden swelling in your hands, ankles, or face. ? Bleeding from your vagina. ? Blood in your pee. ? Shortness of breath. ? Chest pain. ? Weakness on one side of your body. ? Trouble talking.  Your baby is not moving as much as usual. These symptoms may be an emergency. Get help right away. Call your local emergency services (911 in the [place]  Do not wait to see if the symptoms will go away.  Do not drive yourself to the hospital. Summary  High blood pressure is also called hypertension.  High blood pressure means that the force of the blood moving in your body is high enough to cause problems for you and your baby.  Get help right away if you have symptoms of serious problems due to high blood pressure.  Keep all prenatal and follow-up visits. This information is not intended to replace advice given to you by your health care provider. Make sure you discuss any questions you have with your health care provider. Document Revised: 07/06/2020 Document Reviewed: 07/06/2020 Elsevier Patient Education  2021 [employer].

## 2021-01-08 NOTE — MAU Note (Signed)
Pt report she started having lower abd pain last night. Continues off and on today. Denies any vag bleeding or discharge. Had positive HPT last week. Has had an ectopic pregnancy before and is worried.

## 2021-01-09 LAB — GC/CHLAMYDIA PROBE AMP (~~LOC~~) NOT AT ARMC
Chlamydia: NEGATIVE
Comment: NEGATIVE
Comment: NORMAL
Neisseria Gonorrhea: NEGATIVE

## 2021-01-19 DIAGNOSIS — Z3201 Encounter for pregnancy test, result positive: Secondary | ICD-10-CM | POA: Diagnosis not present

## 2021-01-19 DIAGNOSIS — Z3490 Encounter for supervision of normal pregnancy, unspecified, unspecified trimester: Secondary | ICD-10-CM | POA: Diagnosis not present

## 2021-01-19 DIAGNOSIS — Z3481 Encounter for supervision of other normal pregnancy, first trimester: Secondary | ICD-10-CM | POA: Diagnosis not present

## 2021-01-22 DIAGNOSIS — O09521 Supervision of elderly multigravida, first trimester: Secondary | ICD-10-CM | POA: Diagnosis not present

## 2021-01-22 DIAGNOSIS — O09291 Supervision of pregnancy with other poor reproductive or obstetric history, first trimester: Secondary | ICD-10-CM | POA: Diagnosis not present

## 2021-01-22 DIAGNOSIS — I10 Essential (primary) hypertension: Secondary | ICD-10-CM | POA: Diagnosis not present

## 2021-01-22 DIAGNOSIS — O021 Missed abortion: Secondary | ICD-10-CM | POA: Diagnosis not present

## 2021-01-28 ENCOUNTER — Inpatient Hospital Stay (HOSPITAL_COMMUNITY): Payer: BC Managed Care – PPO

## 2021-01-28 ENCOUNTER — Inpatient Hospital Stay (HOSPITAL_COMMUNITY)
Admission: AD | Admit: 2021-01-28 | Discharge: 2021-01-28 | Disposition: A | Discharge status: Home / Self Care | Payer: BC Managed Care – PPO | Attending: Obstetrics and Gynecology | Admitting: Obstetrics and Gynecology

## 2021-01-28 ENCOUNTER — Other Ambulatory Visit: Payer: Self-pay

## 2021-01-28 ENCOUNTER — Encounter (HOSPITAL_COMMUNITY): Payer: Self-pay | Admitting: Obstetrics and Gynecology

## 2021-01-28 DIAGNOSIS — Z3A00 Weeks of gestation of pregnancy not specified: Secondary | ICD-10-CM | POA: Diagnosis not present

## 2021-01-28 DIAGNOSIS — O039 Complete or unspecified spontaneous abortion without complication: Secondary | ICD-10-CM | POA: Diagnosis present

## 2021-01-28 DIAGNOSIS — O209 Hemorrhage in early pregnancy, unspecified: Secondary | ICD-10-CM

## 2021-01-28 DIAGNOSIS — O034 Incomplete spontaneous abortion without complication: Secondary | ICD-10-CM | POA: Diagnosis not present

## 2021-01-28 DIAGNOSIS — Z3A12 12 weeks gestation of pregnancy: Secondary | ICD-10-CM | POA: Diagnosis not present

## 2021-01-28 DIAGNOSIS — R9389 Abnormal findings on diagnostic imaging of other specified body structures: Secondary | ICD-10-CM | POA: Diagnosis not present

## 2021-01-28 LAB — CBC
HCT: 34.8 % — ABNORMAL LOW (ref 36.0–46.0)
Hemoglobin: 11.1 g/dL — ABNORMAL LOW (ref 12.0–15.0)
MCH: 26.1 pg (ref 26.0–34.0)
MCHC: 31.9 g/dL (ref 30.0–36.0)
MCV: 81.9 fL (ref 80.0–100.0)
Platelets: 261 10*3/uL (ref 150–400)
RBC: 4.25 MIL/uL (ref 3.87–5.11)
RDW: 15.8 % — ABNORMAL HIGH (ref 11.5–15.5)
WBC: 6.4 10*3/uL (ref 4.0–10.5)
nRBC: 0 % (ref 0.0–0.2)

## 2021-01-28 LAB — HCG, QUANTITATIVE, PREGNANCY: hCG, Beta Chain, Quant, S: 16392 m[IU]/mL — ABNORMAL HIGH (ref <5)

## 2021-01-28 MED ORDER — PROMETHAZINE HCL 25 MG PO TABS: 25.0000 mg | ORAL_TABLET | Freq: Four times a day (QID) | ORAL | 0 refills | Status: DC | PRN

## 2021-01-28 MED ORDER — ACETAMINOPHEN-CODEINE #3 300-30 MG PO TABS: 1.0000 | ORAL_TABLET | Freq: Four times a day (QID) | ORAL | 0 refills | Status: DC | PRN

## 2021-01-28 MED ORDER — MISOPROSTOL 200 MCG PO TABS
800.0000 ug | ORAL_TABLET | Freq: Once | ORAL | Status: AC
  Administered 2021-01-28: 800 ug via RECTAL
  Filled 2021-01-28: qty 4

## 2021-01-28 MED ORDER — IBUPROFEN 600 MG PO TABS: 600.0000 mg | ORAL_TABLET | Freq: Four times a day (QID) | ORAL | 0 refills | Status: DC | PRN

## 2021-01-28 MED ORDER — IBUPROFEN 600 MG PO TABS
600.0000 mg | ORAL_TABLET | Freq: Once | ORAL | Status: AC
  Administered 2021-01-28: 600 mg via ORAL
  Filled 2021-01-28: qty 1

## 2021-01-28 NOTE — MAU Provider Note (Addendum)
History     CSN: [id number]  Arrival date and time: 01/28/21 9371     Chief Complaint  Patient presents with  . Vaginal Bleeding   Ms. Alice Hays is a 40 y.o. year old [id number] female at [redacted]w[redacted]d weeks gestation who presents to MAU reporting she was diagnosed with a failed pregnancy last Monday. She states she was given the option to have a D&C, take Cytotec or expectant management.  She reports that she decided to wait because she "felt the baby move and wanted to wait."  She reports heavy bleeding that started today.  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  She receives prenatal care at [facility].   OB History    Gravida  5   Para  1   Term  1   Preterm      AB  3   Living  1     SAB  1   IAB      Ectopic  2   Multiple  0   Live Births  1           Past Medical History:  Diagnosis Date  . Anemia   . Ectopic pregnancy    L adnexa  . Hx of varicella   . Hypertension     Past Surgical History:  Procedure Laterality Date  . ARM SURGERY    . CESAREAN SECTION N/A 06/29/2016   Procedure: CESAREAN SECTION;  Surgeon: Maxie Better, MD;  Location: [facility];  Service: Obstetrics;  Laterality: N/A;    Family History  Problem Relation Age of Onset  . Cancer Mother   . Heart disease Mother   . Heart disease Father   . Hypertension Father   . Stroke Father   . Cancer Maternal Aunt   . Diabetes Maternal Grandmother     Social History   Tobacco Use  . Smoking status: Never Smoker  . Smokeless tobacco: Never Used  Vaping Use  . Vaping Use: Never used  Substance Use Topics  . Alcohol use: No  . Drug use: No    Allergies:  Allergies  Allergen Reactions  . Shellfish Allergy Anaphylaxis  . Iodine     Medications Prior to Admission  Medication Sig Dispense Refill Last Dose  . cefadroxil (DURICEF) 500 MG capsule Take 1 capsule (500 mg total) by mouth 2 (two) times daily. 14 capsule 0  Past Month at Unknown time  . NIFEdipine (PROCARDIA-XL/NIFEDICAL-XL) 30 MG 24 hr tablet Take 1 tablet (30 mg total) by mouth daily. 30 tablet 0 01/28/2021 at Unknown time  . Prenatal Vit-Fe Fumarate-FA (PRENATAL MULTIVITAMIN) TABS tablet Take 1 tablet by mouth daily at 12 noon.    01/28/2021 at Unknown time  . ibuprofen (ADVIL,MOTRIN) 600 MG tablet Take 1 tablet (600 mg total) by mouth every 6 (six) hours as needed for up to 30 doses for moderate pain or cramping. 30 tablet 0   . ondansetron (ZOFRAN) 4 MG tablet Take 1 tablet (4 mg total) by mouth every 6 (six) hours as needed for nausea or vomiting. 12 tablet 0   . oxyCODONE-acetaminophen (PERCOCET) 5-325 MG tablet Take 1-2 tablets by mouth every 6 (six) hours as needed for up to 8 doses for severe pain. 8 tablet 0   . promethazine (PHENERGAN) 25 MG tablet Take 1 tablet (25 mg total) by mouth every 6 (six) hours as needed for up to 8 doses for nausea or vomiting. 8 tablet 0  Review of Systems  Constitutional: Negative.   HENT: Negative.   Eyes: Negative.   Respiratory: Negative.   Cardiovascular: Negative.   Gastrointestinal: Negative.   Endocrine: Negative.   Genitourinary: Positive for pelvic pain and vaginal bleeding (heavy and passing clots started today).  Musculoskeletal: Negative.   Skin: Negative.   Allergic/Immunologic: Negative.   Neurological: Negative.   Hematological: Negative.   Psychiatric/Behavioral: Negative.    Physical Exam   Blood pressure (!) 143/94, pulse 91, temperature 98.9 F (37.2 C), temperature source Oral, resp. rate 20, height 4\' 10"  (1.473 m), weight 84.4 kg, last menstrual period 11/03/2020, SpO2 100 %, unknown if currently breastfeeding.  Physical Exam Vitals and nursing note reviewed. Exam conducted with a chaperone present.  Constitutional:      Appearance: Normal appearance. She is obese.  Abdominal:     Palpations: Abdomen is soft.  Genitourinary:    General: Normal vulva.     Comments:  Pelvic exam: External genitalia normal, large blood clot with ?tissue SE: vaginal walls pink and well rugated, cervix is smooth, pink, no lesions, blood clot in cervical os removed with Ring forcep, POC also removed from cervical os- intact, small amt of blood trickling out of cervix after removal of POC cerrvix visually open ~1cm, Uterus is non-tender, no CMT or friability, no adnexal tenderness.  Musculoskeletal:        General: Normal range of motion.  Skin:    General: Skin is warm and dry.  Neurological:     Mental Status: She is alert and oriented to person, place, and time.  Psychiatric:        Mood and Affect: Mood normal.        Behavior: Behavior normal.        Thought Content: Thought content normal.        Judgment: Judgment normal.     MAU Course  Procedures  MDM OB <14 wks U/S CBC HCG Cytotec 800 mcg rectally by RN to assist with expulsion of remaining POC - verbal consent given by patient POC specimen to pathology  Results for orders placed or performed during the hospital encounter of 01/28/21 (from the past 24 hour(s))  CBC     Status: Abnormal   Collection Time: 01/28/21  9:18 PM  Result Value Ref Range   WBC 6.4 4.0 - 10.5 K/uL   RBC 4.25 3.87 - 5.11 MIL/uL   Hemoglobin 11.1 (L) 12.0 - 15.0 g/dL   HCT 03/30/21 (L) 16.3 - 84.6 %   MCV 81.9 80.0 - 100.0 fL   MCH 26.1 26.0 - 34.0 pg   MCHC 31.9 30.0 - 36.0 g/dL   RDW 65.9 (H) 93.5 - 70.1 %   Platelets 261 150 - 400 K/uL   nRBC 0.0 0.0 - 0.2 %  hCG, quantitative, pregnancy     Status: Abnormal   Collection Time: 01/28/21  9:18 PM  Result Value Ref Range   hCG, Beta Chain, Quant, S 16,392 (H) <5 mIU/mL    03/30/21 OB LESS THAN 14 WEEKS WITH OB TRANSVAGINAL  Result Date: 01/28/2021 CLINICAL DATA:  Initial evaluation for heavy vaginal bleeding, recent SAB. EXAM: OBSTETRIC <14 WK 03/30/2021 AND TRANSVAGINAL OB US TECHNIQUE: Both transabdominal and transvaginal ultrasound examinations were performed for complete evaluation of the  gestation as well as the maternal uterus, adnexal regions, and pelvic cul-de-sac. Transvaginal technique was performed to assess early pregnancy. COMPARISON:  Prior ultrasound from 01/08/2021 FINDINGS: Intrauterine gestational sac: Negative. Yolk sac:  Negative. Embryo:  Negative. Cardiac Activity: Negative. Maternal uterus/adnexae: Ovaries are within normal limits. No adnexal mass or free fluid. Endometrial stripe is markedly thickened and heterogeneous, measuring up to 27 mm. Scattered areas of associated vascularity. Findings raise the concern for possible retained products of conception. IMPRESSION: 1. No IUP. Endometrial stripe thickened up to 27 mm with associated vascularity, concerning for possible retained products of conception. 2. No other acute maternal uterine or adnexal abnormality. Electronically Signed   By: Rise Mu M.D.   On: 01/28/2021 22:02     Assessment and Plan  Miscarriage - Information provided on miscarriage and managing pregnancy loss - Advised that she would have increased pain and bleeding after taking Cytotec - Discussed normal vs abnormal bleeding and pain - Rx for Tylenol #3 and Ibuprofen for pain - Rx for Phenergan for N/V - Return to MAU:  If you have heavier bleeding that soaks through more that 2 pads per hour for an hour or more  If you bleed so much that you feel like you might pass out or you do pass out  If you have significant abdominal pain that is not improved with Tylenol 1000 mg every 6 hours as needed for pain  If you develop a fever > 100.5   [redacted] weeks gestation of pregnancy  - Discharge home - Advised to call [facility] to follow-up after miscarriage with repeat HCG and to see provider for follow-up - Patient verbalized an understanding of the plan of care and agrees.    Raelyn Mora, CNM 01/28/2021, 9:18 PM

## 2021-01-28 NOTE — MAU Note (Signed)
Pt states she was at [facility] appointment last Monday and was told the baby had no heartbeat on [place]. Patient was given option of D&C, Cytotec or wait and see. Pt states she felt baby move, so she decided to wait and see. She began bleeding heavily today.

## 2021-01-28 NOTE — Discharge Instructions (Signed)
Return to MAU:  If you have heavier bleeding that soaks through more that 2 pads per hour for an hour or more  If you bleed so much that you feel like you might pass out or you do pass out  If you have significant abdominal pain that is not improved with Tylenol 1000 mg every 6 hours as needed for pain  If you develop a fever > 100.5   

## 2021-01-30 LAB — SURGICAL PATHOLOGY

## 2021-02-06 ENCOUNTER — Other Ambulatory Visit (HOSPITAL_COMMUNITY): Payer: Self-pay

## 2021-02-06 DIAGNOSIS — Z3042 Encounter for surveillance of injectable contraceptive: Secondary | ICD-10-CM | POA: Diagnosis not present

## 2021-02-06 DIAGNOSIS — O039 Complete or unspecified spontaneous abortion without complication: Secondary | ICD-10-CM | POA: Diagnosis not present

## 2021-02-06 MED ORDER — MEDROXYPROGESTERONE ACETATE 150 MG/ML IM SUSP
150.0000 mg | INTRAMUSCULAR | 2 refills | Status: DC
  Filled 2021-02-06: qty 1, 90d supply, fill #0

## 2021-03-02 DIAGNOSIS — O039 Complete or unspecified spontaneous abortion without complication: Secondary | ICD-10-CM | POA: Diagnosis not present

## 2021-05-28 ENCOUNTER — Other Ambulatory Visit: Payer: Self-pay

## 2021-05-28 ENCOUNTER — Emergency Department (HOSPITAL_COMMUNITY)
Admission: EM | Admit: 2021-05-28 | Discharge: 2021-05-28 | Disposition: A | Discharge status: Home / Self Care | Payer: BC Managed Care – PPO | Attending: Emergency Medicine | Admitting: Emergency Medicine

## 2021-05-28 ENCOUNTER — Encounter (HOSPITAL_COMMUNITY): Payer: Self-pay

## 2021-05-28 DIAGNOSIS — R42 Dizziness and giddiness: Secondary | ICD-10-CM | POA: Diagnosis not present

## 2021-05-28 DIAGNOSIS — Z79899 Other long term (current) drug therapy: Secondary | ICD-10-CM | POA: Insufficient documentation

## 2021-05-28 DIAGNOSIS — I10 Essential (primary) hypertension: Secondary | ICD-10-CM | POA: Insufficient documentation

## 2021-05-28 MED ORDER — HYDROCHLOROTHIAZIDE 25 MG PO TABS: 25.0000 mg | ORAL_TABLET | Freq: Every day | ORAL | 0 refills | Status: DC

## 2021-05-28 MED ORDER — HYDROCHLOROTHIAZIDE 12.5 MG PO CAPS
12.5000 mg | ORAL_CAPSULE | Freq: Once | ORAL | Status: AC
  Administered 2021-05-28: 12.5 mg via ORAL
  Filled 2021-05-28: qty 1

## 2021-05-28 NOTE — ED Triage Notes (Signed)
Patient reports increased BP. Patient states she has been compliant with BP med/nifedipine.  BP in triage-182/111. Patient also c/o dizziness x 2 days.

## 2021-05-28 NOTE — ED Provider Notes (Signed)
[facility]-EMERGENCY DEPT Provider Note   CSN: [id number] Arrival date & time: 05/28/21  1535     History Chief Complaint  Patient presents with   Hypertension   Dizziness    Alice Hays is a 40 y.o. female.  Patient here for high blood pressure recheck.  Does not have a primary care doctor.  Currently on Procardia for high blood pressure.  She has been managed by OB/GYN as she does not have a primary care doctor.  Denies any severe headaches, stroke symptoms, chest pain.  The history is provided by the patient.  Hypertension This is a chronic problem. The problem occurs constantly. Pertinent negatives include no chest pain, no abdominal pain, no headaches and no shortness of breath. Nothing aggravates the symptoms. Nothing relieves the symptoms. She has tried nothing for the symptoms. The treatment provided no relief.      Past Medical History:  Diagnosis Date   Anemia    Ectopic pregnancy    L adnexa   Hx of varicella    Hypertension     Patient Active Problem List   Diagnosis Date Noted   Miscarriage 01/28/2021   Essential hypertension 01/02/2019   Labor abnormal 06/29/2016    Postpartum care s/p cesarean section (9/2) 06/29/2016    Past Surgical History:  Procedure Laterality Date   ARM SURGERY     CESAREAN SECTION N/A 06/29/2016   Procedure: CESAREAN SECTION;  Surgeon: Maxie Better, MD;  Location: [facility];  Service: Obstetrics;  Laterality: N/A;     OB History     Gravida  5   Para  1   Term  1   Preterm      AB  3   Living  1      SAB  1   IAB      Ectopic  2   Multiple  0   Live Births  1           Family History  Problem Relation Age of Onset   Cancer Mother    Heart disease Mother    Heart disease Father    Hypertension Father    Stroke Father    Cancer Maternal Aunt    Diabetes Maternal Grandmother     Social History   Tobacco Use   Smoking status: Never   Smokeless  tobacco: Never  Vaping Use   Vaping Use: Never used  Substance Use Topics   Alcohol use: No   Drug use: No    Home Medications Prior to Admission medications   Medication Sig Start Date End Date Taking? Authorizing Provider  hydrochlorothiazide (HYDRODIURIL) 25 MG tablet Take 1 tablet (25 mg total) by mouth daily. 05/28/21 06/27/21 Yes Myeisha Kruser, DO  acetaminophen-codeine (TYLENOL #3) 300-30 MG tablet Take 1-2 tablets by mouth every 6 (six) hours as needed for moderate pain. 01/28/21   Raelyn Mora, CNM  ibuprofen (ADVIL) 600 MG tablet Take 1 tablet (600 mg total) by mouth every 6 (six) hours as needed for up to 30 doses for moderate pain or cramping. 01/28/21   Raelyn Mora, CNM  medroxyPROGESTERone (DEPO-PROVERA) 150 MG/ML injection Inject 1 mL (150 mg total) into the muscle every 13 weeks 02/06/21     NIFEdipine (PROCARDIA-XL/NIFEDICAL-XL) 30 MG 24 hr tablet Take 1 tablet (30 mg total) by mouth daily. 01/08/21 02/07/21  Bernerd Limbo, CNM  Prenatal Vit-Fe Fumarate-FA (PRENATAL MULTIVITAMIN) TABS tablet Take 1 tablet by mouth daily at 12 noon.  [provider]  promethazine (PHENERGAN) 25 MG tablet Take 1 tablet (25 mg total) by mouth every 6 (six) hours as needed for nausea or vomiting. 01/28/21   Raelyn Mora, CNM    Allergies    Shellfish allergy and Iodine  Review of Systems   Review of Systems  Constitutional:  Negative for chills and fever.  HENT:  Negative for ear pain and sore throat.   Eyes:  Negative for pain and visual disturbance.  Respiratory:  Negative for cough and shortness of breath.   Cardiovascular:  Negative for chest pain and palpitations.  Gastrointestinal:  Negative for abdominal pain and vomiting.  Genitourinary:  Negative for dysuria and hematuria.  Musculoskeletal:  Negative for arthralgias and back pain.  Skin:  Negative for color change and rash.  Neurological:  Negative for dizziness, seizures, syncope and headaches.  All other  systems reviewed and are negative.  Physical Exam Updated Vital Signs BP (!) 184/119   Pulse 90   Temp 98.6 F (37 C) (Oral)   Resp 18   Ht 4\' 10"  (1.473 m)   Wt 82.1 kg   LMP 11/03/2020   SpO2 100%   Breastfeeding Unknown   BMI 37.83 kg/m   Physical Exam Vitals and nursing note reviewed.  Constitutional:      General: She is not in acute distress.    Appearance: She is well-developed. She is not ill-appearing.  HENT:     Head: Normocephalic and atraumatic.     Mouth/Throat:     Mouth: Mucous membranes are moist.  Eyes:     Extraocular Movements: Extraocular movements intact.     Conjunctiva/sclera: Conjunctivae normal.     Pupils: Pupils are equal, round, and reactive to light.  Cardiovascular:     Rate and Rhythm: Normal rate and regular rhythm.     Pulses: Normal pulses.     Heart sounds: Normal heart sounds. No murmur heard. Pulmonary:     Effort: Pulmonary effort is normal. No respiratory distress.     Breath sounds: Normal breath sounds.  Abdominal:     Palpations: Abdomen is soft.     Tenderness: There is no abdominal tenderness.  Musculoskeletal:     Cervical back: Neck supple.  Skin:    General: Skin is warm and dry.     Capillary Refill: Capillary refill takes less than 2 seconds.  Neurological:     General: No focal deficit present.     Mental Status: She is alert and oriented to person, place, and time.     Cranial Nerves: No cranial nerve deficit.     Sensory: No sensory deficit.     Motor: No weakness.     Coordination: Coordination normal.     Comments: 5+ out of 5 strength throughout, normal sensation, no drift, normal finger-to-nose finger    ED Results / Procedures / Treatments   Labs (all labs ordered are listed, but only abnormal results are displayed) Labs Reviewed - No data to display  EKG None  Radiology No results found.  Procedures Procedures   Medications Ordered in ED Medications  hydrochlorothiazide (MICROZIDE) capsule  12.5 mg (has no administration in time range)    ED Course  I have reviewed the triage vital signs and the nursing notes.  Pertinent labs & imaging results that were available during my care of the patient were reviewed by me and considered in my medical decision making (see chart for details).    MDM Rules/Calculators/A&P  Alice Hays is here for evaluation of her high blood pressure.  Blood pressure 182/111 with overall patient asymptomatic.  Does not have a primary care doctor.  She has been on Procardia.  Mostly managed by her OB/GYN.  Neurologically she is intact.  No chest pain.  We will start her on hydrochlorothiazide and give her information to follow-up with primary care/cardiology for further outpatient management.  She understands return precautions.  Discharged in good condition.  This chart was dictated using voice recognition software.  Despite best efforts to proofread,  errors can occur which can change the documentation meaning.   Final Clinical Impression(s) / ED Diagnoses Final diagnoses:  Hypertension, unspecified type    Rx / DC Orders ED Discharge Orders          Ordered    hydrochlorothiazide (HYDRODIURIL) 25 MG tablet  Daily        05/28/21 1956             Virgina Norfolk, DO 05/28/21 1958

## 2021-05-28 NOTE — Discharge Instructions (Addendum)
Try to establish care with a primary care doctor.  Follow-up with OB/GYN and cardiology to see if you can continue to have outpatient management of your blood pressure.

## 2021-08-04 ENCOUNTER — Encounter (HOSPITAL_COMMUNITY): Payer: Self-pay | Admitting: Emergency Medicine

## 2021-08-04 ENCOUNTER — Emergency Department (HOSPITAL_COMMUNITY): Payer: BC Managed Care – PPO

## 2021-08-04 ENCOUNTER — Ambulatory Visit (INDEPENDENT_AMBULATORY_CARE_PROVIDER_SITE_OTHER)
Admission: EM | Admit: 2021-08-04 | Discharge: 2021-08-04 | Disposition: A | Discharge status: Home / Self Care | Payer: BC Managed Care – PPO | Source: Home / Self Care

## 2021-08-04 ENCOUNTER — Emergency Department (HOSPITAL_COMMUNITY)
Admission: EM | Admit: 2021-08-04 | Discharge: 2021-08-05 | Discharge status: Against Medical Advice | Payer: BC Managed Care – PPO | Attending: Emergency Medicine | Admitting: Emergency Medicine

## 2021-08-04 ENCOUNTER — Other Ambulatory Visit: Payer: Self-pay

## 2021-08-04 DIAGNOSIS — R0602 Shortness of breath: Secondary | ICD-10-CM | POA: Diagnosis not present

## 2021-08-04 DIAGNOSIS — R059 Cough, unspecified: Secondary | ICD-10-CM | POA: Insufficient documentation

## 2021-08-04 DIAGNOSIS — R519 Headache, unspecified: Secondary | ICD-10-CM | POA: Insufficient documentation

## 2021-08-04 DIAGNOSIS — Z5321 Procedure and treatment not carried out due to patient leaving prior to being seen by health care provider: Secondary | ICD-10-CM | POA: Insufficient documentation

## 2021-08-04 DIAGNOSIS — R062 Wheezing: Secondary | ICD-10-CM | POA: Insufficient documentation

## 2021-08-04 DIAGNOSIS — N9489 Other specified conditions associated with female genital organs and menstrual cycle: Secondary | ICD-10-CM | POA: Diagnosis not present

## 2021-08-04 DIAGNOSIS — R0789 Other chest pain: Secondary | ICD-10-CM | POA: Insufficient documentation

## 2021-08-04 DIAGNOSIS — Z20822 Contact with and (suspected) exposure to covid-19: Secondary | ICD-10-CM | POA: Insufficient documentation

## 2021-08-04 DIAGNOSIS — R030 Elevated blood-pressure reading, without diagnosis of hypertension: Secondary | ICD-10-CM | POA: Insufficient documentation

## 2021-08-04 DIAGNOSIS — J069 Acute upper respiratory infection, unspecified: Secondary | ICD-10-CM | POA: Insufficient documentation

## 2021-08-04 LAB — CBC WITH DIFFERENTIAL/PLATELET
Abs Immature Granulocytes: 0.02 10*3/uL (ref 0.00–0.07)
Basophils Absolute: 0 10*3/uL (ref 0.0–0.1)
Basophils Relative: 1 %
Eosinophils Absolute: 0.3 10*3/uL (ref 0.0–0.5)
Eosinophils Relative: 5 %
HCT: 33 % — ABNORMAL LOW (ref 36.0–46.0)
Hemoglobin: 9.8 g/dL — ABNORMAL LOW (ref 12.0–15.0)
Immature Granulocytes: 0 %
Lymphocytes Relative: 24 %
Lymphs Abs: 1.5 10*3/uL (ref 0.7–4.0)
MCH: 23.1 pg — ABNORMAL LOW (ref 26.0–34.0)
MCHC: 29.7 g/dL — ABNORMAL LOW (ref 30.0–36.0)
MCV: 77.8 fL — ABNORMAL LOW (ref 80.0–100.0)
Monocytes Absolute: 0.8 10*3/uL (ref 0.1–1.0)
Monocytes Relative: 13 %
Neutro Abs: 3.6 10*3/uL (ref 1.7–7.7)
Neutrophils Relative %: 57 %
Platelets: 272 10*3/uL (ref 150–400)
RBC: 4.24 MIL/uL (ref 3.87–5.11)
RDW: 18 % — ABNORMAL HIGH (ref 11.5–15.5)
WBC: 6.2 10*3/uL (ref 4.0–10.5)
nRBC: 0 % (ref 0.0–0.2)

## 2021-08-04 LAB — TROPONIN I (HIGH SENSITIVITY): Troponin I (High Sensitivity): 18 ng/L — ABNORMAL HIGH (ref <18)

## 2021-08-04 LAB — BASIC METABOLIC PANEL
Anion gap: 9 (ref 5–15)
BUN: 12 mg/dL (ref 6–20)
CO2: 25 mmol/L (ref 22–32)
Calcium: 9.1 mg/dL (ref 8.9–10.3)
Chloride: 101 mmol/L (ref 98–111)
Creatinine, Ser: 0.9 mg/dL (ref 0.44–1.00)
GFR, Estimated: >60 mL/min (ref >60)
Glucose, Bld: 107 mg/dL — ABNORMAL HIGH (ref 70–99)
Potassium: 3.7 mmol/L (ref 3.5–5.1)
Sodium: 135 mmol/L (ref 135–145)

## 2021-08-04 LAB — I-STAT BETA HCG BLOOD, ED (MC, WL, AP ONLY): I-stat hCG, quantitative: <5 m[IU]/mL (ref <5)

## 2021-08-04 MED ORDER — PREDNISONE 20 MG PO TABS: 40.0000 mg | ORAL_TABLET | Freq: Every day | ORAL | 0 refills | Status: DC

## 2021-08-04 MED ORDER — PROMETHAZINE-DM 6.25-15 MG/5ML PO SYRP: 5.0000 mL | ORAL_SOLUTION | Freq: Four times a day (QID) | ORAL | 0 refills | Status: DC | PRN

## 2021-08-04 MED ORDER — ALBUTEROL SULFATE HFA 108 (90 BASE) MCG/ACT IN AERS
INHALATION_SPRAY | RESPIRATORY_TRACT | Status: AC
  Filled 2021-08-04: qty 6.7

## 2021-08-04 MED ORDER — ALBUTEROL SULFATE HFA 108 (90 BASE) MCG/ACT IN AERS
2.0000 | INHALATION_SPRAY | Freq: Once | RESPIRATORY_TRACT | Status: AC
  Administered 2021-08-04: 2 via RESPIRATORY_TRACT

## 2021-08-04 MED ORDER — ALBUTEROL SULFATE HFA 108 (90 BASE) MCG/ACT IN AERS: 2.0000 | INHALATION_SPRAY | Freq: Once | RESPIRATORY_TRACT | Status: DC

## 2021-08-04 NOTE — ED Triage Notes (Signed)
Pt is present today with wheezing, cough, SOB, and HA. Pt states sx started x2 days ago.

## 2021-08-04 NOTE — ED Triage Notes (Signed)
Pt c/o SOB, cough, and generalized body aches since Wednesday. Has cold/flu without improvement. Pending COVID test from Urgent Care today.

## 2021-08-04 NOTE — ED Provider Notes (Signed)
Emergency Medicine Provider Triage Evaluation Note  Alice Hays , a 40 y.o. female  was evaluated in triage.  Pt complains of SOB and chest tightness. Began 2 days ago. Coughing up yellow phlegm. No LE edema, no hx of PE, DVT. No fever. Has some HA, myalgias.  Very elevated BP here on Procardia no PCP followed by ObGyn for BP  Review of Systems  Positive: SOB, chest tightness, cough, HA Negative: Fever, emesis, numbness  Physical Exam  BP (!) 194/127 (BP Location: Right Arm)   Pulse (!) 118   Resp 20   LMP 11/03/2020   SpO2 96%  Gen:   Awake, no distress   Resp:  Increased effort, mild expiratory wheeze MSK:   Moves extremities without difficulty, compartments soft, no calf tenderness Other:    Medical Decision Making  Medically screening exam initiated at 10:39 PM.  Appropriate orders placed.  Alice Hays was informed that the remainder of the evaluation will be completed by another provider, this initial triage assessment does not replace that evaluation, and the importance of remaining in the ED until their evaluation is complete.  SOB, cough, HA, chest tightness, elevated BP   Jhonatan Lomeli A, PA-C 08/04/21 2240    Jacalyn Lefevre, MD 08/04/21 2330

## 2021-08-05 LAB — RESP PANEL BY RT-PCR (FLU A&B, COVID) ARPGX2
Influenza A by PCR: NEGATIVE
Influenza B by PCR: NEGATIVE
SARS Coronavirus 2 by RT PCR: NEGATIVE

## 2021-08-05 LAB — TROPONIN I (HIGH SENSITIVITY): Troponin I (High Sensitivity): 18 ng/L — ABNORMAL HIGH (ref <18)

## 2021-08-05 LAB — SARS CORONAVIRUS 2 (TAT 6-24 HRS): SARS Coronavirus 2: NEGATIVE

## 2021-08-05 NOTE — ED Notes (Signed)
Called pt x3 for vitals, checked outside. No response.

## 2021-08-05 NOTE — ED Notes (Signed)
Called pt x3 for room, no response. 

## 2021-08-05 NOTE — ED Notes (Signed)
Called pt x3 for vitals, no response. 

## 2021-08-08 NOTE — ED Provider Notes (Signed)
[facility]    CSN: [id number] Arrival date & time: 08/04/21  1731      History   Chief Complaint Chief Complaint  Patient presents with   Cough   Shortness of Breath   Headache    HPI Alice Hays is a 40 y.o. female.   Presenting today with 2 day history of wheezing, cough, fever, HA, SOB, fatigue. Denies CP, abdominal pain, N/V/D. So far trying OTC fever reducers and congestion medications with minimal relief. Multiple sick contacts recently. No known pulmonary disease history.    Past Medical History:  Diagnosis Date   Anemia    Ectopic pregnancy    L adnexa   Hx of varicella    Hypertension     Patient Active Problem List   Diagnosis Date Noted   Miscarriage 01/28/2021   Essential hypertension 01/02/2019   Labor abnormal 06/29/2016    Postpartum care s/p cesarean section (9/2) 06/29/2016    Past Surgical History:  Procedure Laterality Date   ARM SURGERY     CESAREAN SECTION N/A 06/29/2016   Procedure: CESAREAN SECTION;  Surgeon: Maxie Better, MD;  Location: [facility];  Service: Obstetrics;  Laterality: N/A;    OB History     Gravida  5   Para  1   Term  1   Preterm      AB  3   Living  1      SAB  1   IAB      Ectopic  2   Multiple  0   Live Births  1            Home Medications    Prior to Admission medications   Medication Sig Start Date End Date Taking? Authorizing Provider  predniSONE (DELTASONE) 20 MG tablet Take 2 tablets (40 mg total) by mouth daily with breakfast. 08/04/21  Yes Particia Nearing, PA-C  promethazine-dextromethorphan (PROMETHAZINE-DM) 6.25-15 MG/5ML syrup Take 5 mLs by mouth 4 (four) times daily as needed for cough. 08/04/21  Yes Particia Nearing, PA-C  acetaminophen-codeine (TYLENOL #3) 300-30 MG tablet Take 1-2 tablets by mouth every 6 (six) hours as needed for moderate pain. 01/28/21   Raelyn Mora, CNM  hydrochlorothiazide (HYDRODIURIL) 25 MG tablet Take 1  tablet (25 mg total) by mouth daily. 05/28/21 06/27/21  Curatolo, Adam, DO  ibuprofen (ADVIL) 600 MG tablet Take 1 tablet (600 mg total) by mouth every 6 (six) hours as needed for up to 30 doses for moderate pain or cramping. 01/28/21   Raelyn Mora, CNM  medroxyPROGESTERone (DEPO-PROVERA) 150 MG/ML injection Inject 1 mL (150 mg total) into the muscle every 13 weeks 02/06/21     NIFEdipine (PROCARDIA-XL/NIFEDICAL-XL) 30 MG 24 hr tablet Take 1 tablet (30 mg total) by mouth daily. 01/08/21 02/07/21  Bernerd Limbo, CNM  Prenatal Vit-Fe Fumarate-FA (PRENATAL MULTIVITAMIN) TABS tablet Take 1 tablet by mouth daily at 12 noon.     [provider]  promethazine (PHENERGAN) 25 MG tablet Take 1 tablet (25 mg total) by mouth every 6 (six) hours as needed for nausea or vomiting. 01/28/21   Raelyn Mora, CNM    Family History Family History  Problem Relation Age of Onset   Cancer Mother    Heart disease Mother    Heart disease Father    Hypertension Father    Stroke Father    Cancer Maternal Aunt    Diabetes Maternal Grandmother     Social History Social History  Tobacco Use   Smoking status: Never   Smokeless tobacco: Never  Vaping Use   Vaping Use: Never used  Substance Use Topics   Alcohol use: No   Drug use: No     Allergies   Shellfish allergy and Iodine   Review of Systems Review of Systems PER HPI   Physical Exam Triage Vital Signs ED Triage Vitals  Enc Vitals Group     BP 08/04/21 1755 (!) 164/122     Pulse Rate 08/04/21 1755 (!) 112     Resp 08/04/21 1755 19     Temp 08/04/21 1755 99.8 F (37.7 C)     Temp src --      SpO2 08/04/21 1755 100 %     Weight --      Height --      Head Circumference --      Peak Flow --      Pain Score 08/04/21 1754 0     Pain Loc --      Pain Edu? --      Excl. in GC? --    No data found.  Updated Vital Signs BP (!) 164/122   Pulse (!) 112   Temp 99.8 F (37.7 C)   Resp 19   LMP 11/03/2020   SpO2 100%    Breastfeeding No   Visual Acuity Right Eye Distance:   Left Eye Distance:   Bilateral Distance:    Right Eye Near:   Left Eye Near:    Bilateral Near:     Physical Exam Vitals and nursing note reviewed.  Constitutional:      Appearance: Normal appearance. She is not ill-appearing.  HENT:     Head: Atraumatic.     Right Ear: Tympanic membrane normal.     Left Ear: Tympanic membrane normal.     Nose: Rhinorrhea present.     Mouth/Throat:     Mouth: Mucous membranes are moist.     Pharynx: Oropharynx is clear. Posterior oropharyngeal erythema present.  Eyes:     Extraocular Movements: Extraocular movements intact.     Conjunctiva/sclera: Conjunctivae normal.  Cardiovascular:     Rate and Rhythm: Normal rate and regular rhythm.     Heart sounds: Normal heart sounds.  Pulmonary:     Effort: Pulmonary effort is normal.     Breath sounds: Wheezing present. No rales.     Comments: Mild scattered wheezes Musculoskeletal:        General: Normal range of motion.     Cervical back: Normal range of motion and neck supple.  Skin:    General: Skin is warm and dry.  Neurological:     Mental Status: She is alert and oriented to person, place, and time.  Psychiatric:        Mood and Affect: Mood normal.        Thought Content: Thought content normal.        Judgment: Judgment normal.     UC Treatments / Results  Labs (all labs ordered are listed, but only abnormal results are displayed) Labs Reviewed  SARS CORONAVIRUS 2 (TAT 6-24 HRS)    EKG   Radiology No results found.  Procedures Procedures (including critical care time)  Medications Ordered in UC Medications  albuterol (VENTOLIN HFA) 108 (90 Base) MCG/ACT inhaler 2 puff (2 puffs Inhalation Given 08/04/21 1823)    Initial Impression / Assessment and Plan / UC Course  I have reviewed the triage vital signs and the nursing  notes.  Pertinent labs & imaging results that were available during my care of the patient  were reviewed by me and considered in my medical decision making (see chart for details).     Albuterol inhaler administered in clinic, prednisone, phenergan DM sent. Suspect viral illness, COVID pcr pending. Discussed supportive home care and OTC medications. Return for worsening sxs.   Final Clinical Impressions(s) / UC Diagnoses   Final diagnoses:  Viral URI with cough  Wheezing  SOB (shortness of breath)   Discharge Instructions   None    ED Prescriptions     Medication Sig Dispense Auth. Provider   predniSONE (DELTASONE) 20 MG tablet Take 2 tablets (40 mg total) by mouth daily with breakfast. 10 tablet Particia Nearing, PA-C   promethazine-dextromethorphan (PROMETHAZINE-DM) 6.25-15 MG/5ML syrup Take 5 mLs by mouth 4 (four) times daily as needed for cough. 100 mL Particia Nearing, [place]      PDMP not reviewed this encounter.   Particia Nearing, [place] 08/08/21 2316

## 2021-10-04 ENCOUNTER — Ambulatory Visit (HOSPITAL_COMMUNITY)
Admission: EM | Admit: 2021-10-04 | Discharge: 2021-10-04 | Disposition: A | Discharge status: Home / Self Care | Payer: BC Managed Care – PPO | Attending: Emergency Medicine | Admitting: Emergency Medicine

## 2021-10-04 ENCOUNTER — Encounter (HOSPITAL_COMMUNITY): Payer: Self-pay

## 2021-10-04 ENCOUNTER — Other Ambulatory Visit: Payer: Self-pay

## 2021-10-04 DIAGNOSIS — S161XXA Strain of muscle, fascia and tendon at neck level, initial encounter: Secondary | ICD-10-CM

## 2021-10-04 MED ORDER — NAPROXEN 375 MG PO TABS: 375.0000 mg | ORAL_TABLET | Freq: Two times a day (BID) | ORAL | 0 refills | Status: DC | PRN

## 2021-10-04 MED ORDER — CYCLOBENZAPRINE HCL 5 MG PO TABS: 5.0000 mg | ORAL_TABLET | Freq: Three times a day (TID) | ORAL | 0 refills | Status: DC | PRN
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
## 2021-10-04 NOTE — Discharge Instructions (Addendum)
Use heat therapy and gentle stretching to help relax your tight neck muscle.

## 2021-11-28 ENCOUNTER — Emergency Department (HOSPITAL_COMMUNITY): Payer: BC Managed Care – PPO

## 2021-11-28 ENCOUNTER — Encounter (HOSPITAL_COMMUNITY): Payer: Self-pay | Admitting: Emergency Medicine

## 2021-11-28 ENCOUNTER — Inpatient Hospital Stay (HOSPITAL_COMMUNITY)
Admission: EM | Admit: 2021-11-28 | Discharge: 2021-12-03 | DRG: 286 | Disposition: A | Discharge status: Home / Self Care | Payer: BC Managed Care – PPO | Attending: Family Medicine | Admitting: Family Medicine

## 2021-11-28 ENCOUNTER — Other Ambulatory Visit: Payer: Self-pay

## 2021-11-28 DIAGNOSIS — I509 Heart failure, unspecified: Secondary | ICD-10-CM | POA: Diagnosis not present

## 2021-11-28 DIAGNOSIS — I110 Hypertensive heart disease with heart failure: Principal | ICD-10-CM | POA: Diagnosis present

## 2021-11-28 DIAGNOSIS — K7689 Other specified diseases of liver: Secondary | ICD-10-CM

## 2021-11-28 DIAGNOSIS — Z20822 Contact with and (suspected) exposure to covid-19: Secondary | ICD-10-CM | POA: Diagnosis present

## 2021-11-28 DIAGNOSIS — I422 Other hypertrophic cardiomyopathy: Secondary | ICD-10-CM | POA: Diagnosis present

## 2021-11-28 DIAGNOSIS — R222 Localized swelling, mass and lump, trunk: Secondary | ICD-10-CM | POA: Diagnosis present

## 2021-11-28 DIAGNOSIS — Z79899 Other long term (current) drug therapy: Secondary | ICD-10-CM

## 2021-11-28 DIAGNOSIS — I160 Hypertensive urgency: Secondary | ICD-10-CM | POA: Diagnosis present

## 2021-11-28 DIAGNOSIS — D509 Iron deficiency anemia, unspecified: Secondary | ICD-10-CM | POA: Diagnosis not present

## 2021-11-28 DIAGNOSIS — E876 Hypokalemia: Secondary | ICD-10-CM | POA: Diagnosis present

## 2021-11-28 DIAGNOSIS — I5021 Acute systolic (congestive) heart failure: Secondary | ICD-10-CM | POA: Diagnosis not present

## 2021-11-28 DIAGNOSIS — R609 Edema, unspecified: Secondary | ICD-10-CM

## 2021-11-28 DIAGNOSIS — I10 Essential (primary) hypertension: Secondary | ICD-10-CM | POA: Diagnosis not present

## 2021-11-28 DIAGNOSIS — R0602 Shortness of breath: Secondary | ICD-10-CM | POA: Diagnosis not present

## 2021-11-28 DIAGNOSIS — I429 Cardiomyopathy, unspecified: Secondary | ICD-10-CM

## 2021-11-28 DIAGNOSIS — Z833 Family history of diabetes mellitus: Secondary | ICD-10-CM | POA: Diagnosis not present

## 2021-11-28 DIAGNOSIS — Z8249 Family history of ischemic heart disease and other diseases of the circulatory system: Secondary | ICD-10-CM | POA: Diagnosis not present

## 2021-11-28 DIAGNOSIS — J9601 Acute respiratory failure with hypoxia: Secondary | ICD-10-CM | POA: Diagnosis not present

## 2021-11-28 DIAGNOSIS — R079 Chest pain, unspecified: Secondary | ICD-10-CM | POA: Diagnosis not present

## 2021-11-28 DIAGNOSIS — J9859 Other diseases of mediastinum, not elsewhere classified: Secondary | ICD-10-CM | POA: Diagnosis not present

## 2021-11-28 DIAGNOSIS — R7989 Other specified abnormal findings of blood chemistry: Secondary | ICD-10-CM | POA: Diagnosis not present

## 2021-11-28 DIAGNOSIS — R0609 Other forms of dyspnea: Secondary | ICD-10-CM | POA: Diagnosis not present

## 2021-11-28 DIAGNOSIS — J90 Pleural effusion, not elsewhere classified: Secondary | ICD-10-CM | POA: Diagnosis not present

## 2021-11-28 DIAGNOSIS — D649 Anemia, unspecified: Secondary | ICD-10-CM | POA: Diagnosis not present

## 2021-11-28 DIAGNOSIS — J9811 Atelectasis: Secondary | ICD-10-CM | POA: Diagnosis not present

## 2021-11-28 DIAGNOSIS — Z91013 Allergy to seafood: Secondary | ICD-10-CM

## 2021-11-28 DIAGNOSIS — E320 Persistent hyperplasia of thymus: Secondary | ICD-10-CM | POA: Diagnosis not present

## 2021-11-28 DIAGNOSIS — I517 Cardiomegaly: Secondary | ICD-10-CM | POA: Diagnosis not present

## 2021-11-28 LAB — CBC
HCT: 35.5 % — ABNORMAL LOW (ref 36.0–46.0)
Hemoglobin: 10 g/dL — ABNORMAL LOW (ref 12.0–15.0)
MCH: 21.7 pg — ABNORMAL LOW (ref 26.0–34.0)
MCHC: 28.2 g/dL — ABNORMAL LOW (ref 30.0–36.0)
MCV: 77 fL — ABNORMAL LOW (ref 80.0–100.0)
Platelets: 333 10*3/uL (ref 150–400)
RBC: 4.61 MIL/uL (ref 3.87–5.11)
RDW: 19.4 % — ABNORMAL HIGH (ref 11.5–15.5)
WBC: 8.5 10*3/uL (ref 4.0–10.5)
nRBC: 0 % (ref 0.0–0.2)

## 2021-11-28 LAB — COMPREHENSIVE METABOLIC PANEL
ALT: 50 U/L — ABNORMAL HIGH (ref 0–44)
AST: 37 U/L (ref 15–41)
Albumin: 3.1 g/dL — ABNORMAL LOW (ref 3.5–5.0)
Alkaline Phosphatase: 66 U/L (ref 38–126)
Anion gap: 8 (ref 5–15)
BUN: 13 mg/dL (ref 6–20)
CO2: 25 mmol/L (ref 22–32)
Calcium: 8.5 mg/dL — ABNORMAL LOW (ref 8.9–10.3)
Chloride: 103 mmol/L (ref 98–111)
Creatinine, Ser: 0.84 mg/dL (ref 0.44–1.00)
GFR, Estimated: >60 mL/min (ref >60)
Glucose, Bld: 109 mg/dL — ABNORMAL HIGH (ref 70–99)
Potassium: 4 mmol/L (ref 3.5–5.1)
Sodium: 136 mmol/L (ref 135–145)
Total Bilirubin: 0.5 mg/dL (ref 0.3–1.2)
Total Protein: 5.8 g/dL — ABNORMAL LOW (ref 6.5–8.1)

## 2021-11-28 LAB — BRAIN NATRIURETIC PEPTIDE: B Natriuretic Peptide: 1267.7 pg/mL — ABNORMAL HIGH (ref 0.0–100.0)

## 2021-11-28 LAB — TROPONIN I (HIGH SENSITIVITY)
Troponin I (High Sensitivity): 25 ng/L — ABNORMAL HIGH (ref <18)
Troponin I (High Sensitivity): 26 ng/L — ABNORMAL HIGH (ref <18)

## 2021-11-28 LAB — I-STAT BETA HCG BLOOD, ED (MC, WL, AP ONLY): I-stat hCG, quantitative: <5 m[IU]/mL (ref <5)

## 2021-11-28 LAB — I-STAT ARTERIAL BLOOD GAS, ED
Acid-Base Excess: 1 mmol/L (ref 0.0–2.0)
Bicarbonate: 24.3 mmol/L (ref 20.0–28.0)
Calcium, Ion: 1.1 mmol/L — ABNORMAL LOW (ref 1.15–1.40)
HCT: 32 % — ABNORMAL LOW (ref 36.0–46.0)
Hemoglobin: 10.9 g/dL — ABNORMAL LOW (ref 12.0–15.0)
O2 Saturation: 86 %
Patient temperature: 98.5
Potassium: 3.4 mmol/L — ABNORMAL LOW (ref 3.5–5.1)
Sodium: 135 mmol/L (ref 135–145)
TCO2: 25 mmol/L (ref 22–32)
pCO2 arterial: 33.5 mmHg (ref 32.0–48.0)
pH, Arterial: 7.469 — ABNORMAL HIGH (ref 7.350–7.450)
pO2, Arterial: 47 mmHg — ABNORMAL LOW (ref 83.0–108.0)

## 2021-11-28 LAB — D-DIMER, QUANTITATIVE: D-Dimer, Quant: 1.95 ug/mL-FEU — ABNORMAL HIGH (ref 0.00–0.50)

## 2021-11-28 MED ORDER — ALBUTEROL SULFATE (2.5 MG/3ML) 0.083% IN NEBU
5.0000 mg | INHALATION_SOLUTION | RESPIRATORY_TRACT | Status: DC | PRN
  Administered 2021-11-28: 5 mg via RESPIRATORY_TRACT
  Filled 2021-11-28: qty 6

## 2021-11-28 MED ORDER — HYDRALAZINE HCL 20 MG/ML IJ SOLN
20.0000 mg | Freq: Once | INTRAMUSCULAR | Status: AC
  Administered 2021-11-28: 20 mg via INTRAVENOUS
  Filled 2021-11-28: qty 1

## 2021-11-28 MED ORDER — ASPIRIN 81 MG PO CHEW
324.0000 mg | CHEWABLE_TABLET | Freq: Once | ORAL | Status: AC
  Administered 2021-11-28: 324 mg via ORAL
  Filled 2021-11-28: qty 4

## 2021-11-28 MED ORDER — FUROSEMIDE 10 MG/ML IJ SOLN
40.0000 mg | Freq: Once | INTRAMUSCULAR | Status: AC
  Administered 2021-11-28: 40 mg via INTRAVENOUS
  Filled 2021-11-28: qty 4

## 2021-11-28 MED ORDER — IOHEXOL 350 MG/ML SOLN
70.0000 mL | Freq: Once | INTRAVENOUS | Status: AC | PRN
  Administered 2021-11-28: 70 mL via INTRAVENOUS

## 2021-11-28 NOTE — ED Triage Notes (Signed)
Patient coming from home, complaint of shortness of breath and leg pain for a couple of weeks. Pt hypertensive @ triage 213/140.

## 2021-11-28 NOTE — ED Provider Notes (Signed)
[facility] EMERGENCY DEPARTMENT Provider Note   CSN: [id number] Arrival date & time: 11/28/21  1422     History  Chief Complaint  Patient presents with   Shortness of Breath   Leg Pain    Alice Hays is a 41 y.o. female with PMHx HTN who presents to the ED today with complaint of gradual onset, constant, worsening, shortness of breath x 1 month, worsening over the past week. Pt also complains of bilateral leg pain (L > R) for the past month. Pt attributed it to gaining about 20 pounds recently. She has been trying to lose the weight however continues to have the pain. Pt denies any hx DVT/PE. No recent prolonged immobilization or surgery. Receives depo injections. No hemoptysis. No active malignancy. Pt is a non smoker. She denies any specific chest pain.   The history is provided by the patient and medical records.      Home Medications Prior to Admission medications   Medication Sig Start Date End Date Taking? Authorizing Provider  Multiple Vitamin (MV-ONE PO) Take 1 capsule by mouth daily.   Yes [provider]  cyclobenzaprine (FLEXERIL) 5 MG tablet Take 1 tablet (5 mg total) by mouth 3 (three) times daily as needed for muscle spasms. Patient not taking: Reported on 11/28/2021 10/04/21   Cathlyn Parsons, NP  medroxyPROGESTERone (DEPO-PROVERA) 150 MG/ML injection Inject 1 mL (150 mg total) into the muscle every 13 weeks Patient not taking: Reported on 11/28/2021 02/06/21     naproxen (NAPROSYN) 375 MG tablet Take 1 tablet (375 mg total) by mouth 2 (two) times daily as needed. Patient not taking: Reported on 11/28/2021 10/04/21   Cathlyn Parsons, NP  NIFEdipine (PROCARDIA-XL/NIFEDICAL-XL) 30 MG 24 hr tablet Take 1 tablet (30 mg total) by mouth daily. Patient not taking: Reported on 11/28/2021 01/08/21 02/07/21  Bernerd Limbo, CNM  Prenatal Vit-Fe Fumarate-FA (PRENATAL MULTIVITAMIN) TABS tablet Take 1 tablet by mouth daily at 12 noon.  Patient not taking:  Reported on 11/28/2021    [provider]      Allergies    Shellfish allergy    Review of Systems   Review of Systems  Constitutional:  Negative for chills and fever.  Respiratory:  Positive for shortness of breath.   Cardiovascular:  Negative for chest pain and leg swelling.  Gastrointestinal:  Negative for nausea and vomiting.  Musculoskeletal:  Positive for arthralgias.  All other systems reviewed and are negative.  Physical Exam Updated Vital Signs BP (!) 213/140 (BP Location: Left Arm)    Pulse (!) 107    Temp 98.5 F (36.9 C) (Oral)    Resp (!) 24    SpO2 100%   Physical Exam Vitals and nursing note reviewed.  Constitutional:      Appearance: She is not ill-appearing.  HENT:     Head: Normocephalic and atraumatic.  Eyes:     Conjunctiva/sclera: Conjunctivae normal.  Cardiovascular:     Rate and Rhythm: Regular rhythm. Tachycardia present.  Pulmonary:     Effort: Tachypnea present.     Comments: Speaking in one word sentences. Very faint end expiratory wheezing on exam. Lungs clear otherwise. Satting 100% on RA.  Abdominal:     Palpations: Abdomen is soft.     Tenderness: There is no abdominal tenderness.  Musculoskeletal:     Cervical back: Neck supple.     Right lower leg: No tenderness. Edema present.     Left lower leg: No tenderness.  Edema present.  Skin:    General: Skin is warm and dry.  Neurological:     Mental Status: She is alert.    ED Results / Procedures / Treatments   Labs (all labs ordered are listed, but only abnormal results are displayed) Labs Reviewed  CBC - Abnormal; Notable for the following components:      Result Value   Hemoglobin 10.0 (*)    HCT 35.5 (*)    MCV 77.0 (*)    MCH 21.7 (*)    MCHC 28.2 (*)    RDW 19.4 (*)    All other components within normal limits  COMPREHENSIVE METABOLIC PANEL - Abnormal; Notable for the following components:   Glucose, Bld 109 (*)    Calcium 8.5 (*)    Total Protein 5.8 (*)     Albumin 3.1 (*)    ALT 50 (*)    All other components within normal limits  D-DIMER, QUANTITATIVE - Abnormal; Notable for the following components:   D-Dimer, Quant 1.95 (*)    All other components within normal limits  BRAIN NATRIURETIC PEPTIDE - Abnormal; Notable for the following components:   B Natriuretic Peptide 1,267.7 (*)    All other components within normal limits  I-STAT ARTERIAL BLOOD GAS, ED - Abnormal; Notable for the following components:   pH, Arterial 7.469 (*)    pO2, Arterial 47 (*)    Potassium 3.4 (*)    Calcium, Ion 1.10 (*)    HCT 32.0 (*)    Hemoglobin 10.9 (*)    All other components within normal limits  TROPONIN I (HIGH SENSITIVITY) - Abnormal; Notable for the following components:   Troponin I (High Sensitivity) 25 (*)    All other components within normal limits  TROPONIN I (HIGH SENSITIVITY) - Abnormal; Notable for the following components:   Troponin I (High Sensitivity) 26 (*)    All other components within normal limits  RESP PANEL BY RT-PCR (FLU A&B, COVID) ARPGX2  CBG MONITORING, ED  I-STAT BETA HCG BLOOD, ED ([facility], [facility], [facility] ONLY)    EKG None  Radiology CT Angio Chest PE W/Cm &/Or Wo Cm  Result Date: 11/28/2021 CLINICAL DATA:  41 year old female presents with shortness of breath and leg pain. EXAM: CT ANGIOGRAPHY CHEST WITH CONTRAST TECHNIQUE: Multidetector CT imaging of the chest was performed using the standard protocol during bolus administration of intravenous contrast. Multiplanar CT image reconstructions and MIPs were obtained to evaluate the vascular anatomy. RADIATION DOSE REDUCTION: This exam was performed according to the departmental dose-optimization program which includes automated exposure control, adjustment of the mA and/or kV according to patient size and/or use of iterative reconstruction technique. CONTRAST:  70mL OMNIPAQUE IOHEXOL 350 MG/ML SOLN COMPARISON:  None FINDINGS: Cardiovascular: Question of mild pericardial thickening.  Heart size is enlarged. Moderately to markedly enlarged accounting for low lung volumes. Aortic caliber is normal. Central pulmonary vasculature is opacified to 314 Hounsfield units. Pulmonary vascular bed evaluated to the segmental level without evidence of pulmonary embolism Mediastinum/Nodes: Esophagus is grossly normal. There is no thoracic inlet lymphadenopathy. No axillary lymphadenopathy. No hilar lymphadenopathy. Nodular appearing anterior mediastinal soft tissue is noted along the anterior surface of the heart. This measures approximately 1.51.7 cm greatest thickness no discrete adenopathy along the RIGHT paratracheal chain or in the subcarinal region Lungs/Pleura: Small bilateral pleural effusions. Mosaic attenuation in the lung parenchyma towards the lung apices. Mild basilar atelectasis and signs of septal thickening. Upper Abdomen: Low attenuation in the anterior LEFT hemi  liver likely a small cyst not well characterized on the current exam and measuring greater than 30 Hounsfield units (image 126/5) this measures 15 mm. Infiltration of the fat about the adrenal glands may be present though low-dose evaluation limits assessment of this area. No additional findings of note in the upper abdomen. Musculoskeletal: No chest wall abnormality. No acute or significant osseous findings. Review of the MIP images confirms the above findings. IMPRESSION: 1. No evidence of pulmonary embolism to the segmental level. 2. Findings of congestive heart failure with edema, septal thickening and small bilateral pleural effusions. Cardiomegaly, potentially representing cardiomyopathy in a patient of this age. 3. Nodular appearing anterior mediastinal soft tissue is noted along the anterior surface of the heart. This measures approximately 1.5-1.7 cm greatest thickness. 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 Consider correlation  with thyroid function as Grave's disease as an association with both thymic hyperplasia and cardiac dysfunction. Would consider three-month follow-up with either CT or MRI to ensure resolution and or stability of anterior mediastinal soft tissue. Would also correlate with any known history of autoimmune disorder a could lead to cardiac dysfunction and thymic hyperplasia such as lupus. 4. Low attenuation along the anterior surface of the LEFT hemi liver is almost certainly a benign cyst though with attenuation values in the range of 30 Hounsfield units consider follow-up abdominal ultrasound four complete characterization. 5. Subtle stranding in the retroperitoneal fat of the upper abdomen may be related to generalized edema developing in the setting of cardiac dysfunction. Correlate with any abdominal symptoms with abdominal imaging as warranted for further assessment. Electronically Signed   By: Donzetta Kohut M.D.   On: 11/28/2021 21:47   DG Chest Port 1 View  Result Date: 11/28/2021 CLINICAL DATA:  Shortness of breath EXAM: PORTABLE CHEST 1 VIEW COMPARISON:  Chest x-ray dated August 04, 2021 FINDINGS: Increased enlargement of the cardiac contours compared with prior exam, although evaluation is somewhat limited due to differences in technique. Mild bilateral interstitial opacities. No large pleural effusion or pneumothorax. IMPRESSION: 1. Increased enlargement of the cardiac contours when compared with prior exam. Recommend echocardiography for further evaluation. 2. Mild bilateral interstitial opacities, likely due to pulmonary edema. Electronically Signed   By: Allegra Lai M.D.   On: 11/28/2021 17:29   VAS [place] LOWER EXTREMITY VENOUS (DVT) (7a-7p)  Result Date: 11/28/2021  Lower Venous DVT Study Patient Name:  Alice Hays  Date of Exam:   11/28/2021 Medical Rec #: [id number]           Accession #:    [id number] Date of Birth: 10-15-1981           Patient Gender: F Patient Age:   81 years Exam  Location:  [facility] Procedure:      VAS [place] LOWER EXTREMITY VENOUS (DVT) Referring Phys: JOSHUA GEIPLE --------------------------------------------------------------------------------  Indications: Edema, and SOB.  Comparison Study: No prior study Performing Technologist: Gertie Fey MHA, RDMS, RVT, RDCS  Examination Guidelines: A complete evaluation includes B-mode imaging, spectral Doppler, color Doppler, and power Doppler as needed of all accessible portions of each vessel. Bilateral testing is considered an integral part of a complete examination. Limited examinations for reoccurring indications may be performed as noted. The reflux portion of the exam is performed with the patient in reverse Trendelenburg.  +-----+---------------+---------+-----------+----------+--------------+  RIGHT Compressibility Phasicity Spontaneity Properties Thrombus Aging  +-----+---------------+---------+-----------+----------+--------------+  CFV   Full  Yes       Yes                                    +-----+---------------+---------+-----------+----------+--------------+   +---------+---------------+---------+-----------+----------+--------------+  LEFT      Compressibility Phasicity Spontaneity Properties Thrombus Aging  +---------+---------------+---------+-----------+----------+--------------+  CFV       Full            Yes       Yes                                    +---------+---------------+---------+-----------+----------+--------------+  SFJ       Full                                                             +---------+---------------+---------+-----------+----------+--------------+  FV Prox   Full                                                             +---------+---------------+---------+-----------+----------+--------------+  FV Mid    Full                                                             +---------+---------------+---------+-----------+----------+--------------+  FV  Distal Full                                                             +---------+---------------+---------+-----------+----------+--------------+  PFV       Full                                                             +---------+---------------+---------+-----------+----------+--------------+  POP       Full            Yes       Yes                                    +---------+---------------+---------+-----------+----------+--------------+  PTV       Full                                                             +---------+---------------+---------+-----------+----------+--------------+  PERO      Full                                                             +---------+---------------+---------+-----------+----------+--------------+     Summary: RIGHT: - No evidence of common femoral vein obstruction.  LEFT: - There is no evidence of deep vein thrombosis in the lower extremity.  - No cystic structure found in the popliteal fossa. -Pulsatile lower extremity venous flow is suggestive of possibly elevated right heart pressure.  *See table(s) above for measurements and observations.    Preliminary     Procedures .Critical Care Performed by: Tanda Rockers, PA-C Authorized by: Tanda Rockers, PA-C   Critical care provider statement:    Critical care time (minutes):  45   Critical care was necessary to treat or prevent imminent or life-threatening deterioration of the following conditions:  Respiratory failure   Critical care was time spent personally by me on the following activities:  Development of treatment plan with patient or surrogate, discussions with consultants, evaluation of patient's response to treatment, examination of patient, ordering and review of laboratory studies, ordering and review of radiographic studies, ordering and performing treatments and interventions, pulse oximetry, re-evaluation of patient's condition and review of old charts    Medications Ordered in  ED Medications  albuterol (PROVENTIL) (2.5 MG/3ML) 0.083% nebulizer solution 5 mg (5 mg Nebulization Given 11/28/21 1714)  aspirin chewable tablet 324 mg (324 mg Oral Given 11/28/21 1747)  hydrALAZINE (APRESOLINE) injection 20 mg (20 mg Intravenous Given 11/28/21 1747)  furosemide (LASIX) injection 40 mg (40 mg Intravenous Given 11/28/21 1958)  iohexol (OMNIPAQUE) 350 MG/ML injection 70 mL (70 mLs Intravenous Contrast Given 11/28/21 2104)    ED Course/ Medical Decision Making/ A&P Clinical Course as of 11/28/21 2210  Wed Nov 28, 2021  1700 Hemoglobin(!): 10.0 Baseline [MV]  1921 B Natriuretic Peptide(!): 1,267.7 [MV]    Clinical Course User Index [MV] Tanda Rockers, PA-C                           Medical Decision Making 41 year old female who presents to the ED today with ongoing complaints of shortness of breath for the past month as well as bilateral leg pain, left greater than right.  When she is brought back to the room she is significantly tachypneic despite being wheeled in a wheelchair.  She is surprisingly satting at 100% on room air.  She is speaking in one-word sentences.  Very mild end expiratory wheezing on exam however no history of asthma.  [place] therapist called for further evaluation as there is concern patient requires BiPAP at this time.  Complaining of bilateral leg pain, left greater than right.  DVT study ordered in triage for left leg which is negative at this time.  No common femoral right DVT appreciated.  Does not appear she had DVT all the way down right lower extremity.  D-dimer has been ordered as well with complaint of shortness of breath.  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  She has iodine listed as allergy at this time as well.  Will require further evaluation if D-dimer returns positive.   Workup started in waiting room: CBC without leukocytosis. Hgb stable at 10.0 compared to previous.  CMP with  glucose 109. No other electrolyte abnormalities.   Troponin of 25 Beta hcg negative CXR: IMPRESSION:  1. Increased enlargement of the cardiac contours when compared with  prior exam. Recommend echocardiography for further evaluation.  2. Mild bilateral interstitial opacities, likely due to pulmonary  edema.   Problems Addressed: Acute congestive heart failure, unspecified heart failure type [facility]): acute illness or injury    Details: Pt initially presented and placed on BiPAP. Has been weaned to 2 L [place] at this time. IV Lasix provided with good diuresis.  Amount and/or Complexity of Data Reviewed Labs: ordered. Decision-making details documented in ED Course.    Details: ABG collected by RT, however collected venous blood instead. pCO2 33.5 and pH 7.469.  D dimer elevated at 1.95. Will proceed with CT scan.  BNP 1,267.7. IV lasix provided. Suspect this is cause of elevated D dimer however will await CTA. Repeat troponin 26 Radiology: ordered.    Details: CTA negative for PE. 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 ECG/medicine tests: ordered. Discussion of management or test interpretation with external provider(s): RT has evaluated patient. Placed on BiPAP at this time. ABG ordered.  Discussed case with family medicine who will come evaluate patient for admission.    Risk OTC drugs. Prescription drug management.  Critical Care Total time providing critical care: 30-74 minutes         Final Clinical Impression(s) / ED Diagnoses Final diagnoses:  Acute congestive heart failure, unspecified heart failure type [facility])    Rx / DC Orders ED Discharge Orders     None         Tanda Rockers, PA-C 11/28/21 2210    Benjiman Core, MD 11/29/21 1425

## 2021-11-28 NOTE — Progress Notes (Signed)
RT NOTE:  Pt taken off BIPAP. Pt w/ normal respiratory drive. WOB normal, no SOB. Pt placed on 2L [place].

## 2021-11-28 NOTE — ED Notes (Addendum)
RN stated she will draw the troponin.

## 2021-11-28 NOTE — Progress Notes (Signed)
Left lower extremity venous duplex completed. Refer to "CV Proc" under chart review to view preliminary results.  11/28/2021 4:44 PM Eula Fried., MHA, RVT, RDCS, RDMS

## 2021-11-28 NOTE — ED Provider Triage Note (Signed)
Emergency Medicine Provider Triage Evaluation Note  Alice Hays , a 41 y.o. female  was evaluated in triage.  Pt complains of left leg pain for 2 weeks and progressive shortness of breath, worse with exertion and now at rest over the past 1 week. Patient denies risk factors for pulmonary embolism including: unilateral leg swelling, history of DVT/PE/other blood clots, use of exogenous hormones, recent immobilizations, recent surgery, recent travel (>4hr segment), malignancy, hemoptysis.  No significant chest pains.  She decided come to the hospital because she was off of work today.  Reports anaphylaxis to shellfish.  Iodine allergy listed in chart.  Review of Systems  Positive: Shortness of breath, leg pain Negative: Syncope  Physical Exam  BP (!) 213/140 (BP Location: Left Arm)    Pulse (!) 107    Temp 98.5 F (36.9 C) (Oral)    Resp (!) 24    SpO2 100%  Gen:   Awake, no distress   Resp:  Normal effort  MSK:   Moves extremities without difficulty  Other:  Tachycardia, lungs clear to auscultation  Medical Decision Making  Medically screening exam initiated at 4:11 PM.  Appropriate orders placed.  Alice Hays was informed that the remainder of the evaluation will be completed by another provider, this initial triage assessment does not replace that evaluation, and the importance of remaining in the ED until their evaluation is complete.  Will need to rule out PE.  Will get D-dimer and lower extremity Doppler.  Patient has an iodine allergy listed in her chart and anaphylaxis to shellfish -- would otherwise order CTA of chest.   Renne Crigler, PA-C 11/28/21 1613

## 2021-11-28 NOTE — ED Notes (Addendum)
Pt transported to CT ?

## 2021-11-28 NOTE — H&P (Addendum)
[facility] Admission History and Physical Service Pager: [phone]  Patient name: Alice Hays Medical record number: [id number] Date of birth: December 13, 1980 Age: 41 y.o. Gender: female  Primary Care Provider: Pcp, No Consultants:  Code Status: FULL Preferred Emergency Contact: Lynnell Grain, significant other, [phone]  Chief Complaint: Shortness of breath and swelling  Assessment and Plan: Alice Hays is a 41 y.o. female presenting with shortness of breath and swelling. PMH is significant for hypertension, anemia, multiple miscarriages.  Acute Respiratory Distress   Hypoxemia    New Onset Heart Failure Patient being admitted for shortness of breath and presumed new onset heart failure.  Vitals in the ED showed patient being afebrile, tachycardic to low 100s, tachypneic initially to 40 was put on BiPAP for comfort but saturating well and currently on 2 L nasal cannula at 100%, and hypertensive.  BNP was elevated to 1267.7, troponins trended from 25>26, D-dimer was elevated at 1.95, anemia around her baseline.  ABG showed pH of 7.469 and hypoxemia to 47. Chest x-ray showed increased cardiac contour and interstitial edema bilaterally.  EKG showed sinus tachycardia without acute ST changes.  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  Received Lasix 40 mg x 1, and albuterol nebulizer x1.  On examination appeared edematous in the lower extremity 1+ pitting to knees bilaterally with mild fluid wave in abdomen.  Was mildly dyspneic after speaking and felt more comfortable sitting upright in bed.  No murmurs rubs or gallops on heart examination.  Differentials include heart failure due to uncontrolled hypertension given blood  pressures were elevated greatly in the ED and she said she had taken her antihypertensive today (Nifedipine 30 mg daily listed in her medications but says she is likely taking a different medication but unsure which one it is). 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  Unlikely to be pulmonary embolism causing right heart strain given CTA showed no PE and DVT ultrasound was negative.  Hypertrophic cardiomyopathy possibility given patient age and septal thickening on CTA.  Drug-induced cardiomyopathy possibility however patient denies any history of cocaine use or other drug use/alcohol use.  Graves' disease possibility as well given patient has thymic hyperplasia and cardiac dysfunction, there was no exophthalmos on examination or obvious neck mass. Myasthenia gravis possibility given patient has respiratory difficulty with exertion as well as leg pain/weakness with exertion as well along with thymic hyperplasia although does not have dysphagia/ptosis.  -Admit to [facility], attending Dr. Nori Riis, med telemetry -Vitals per floor protocol -Cardiac monitoring -Keep oxygen saturation greater than 92% -Strict I's and O's -Daily weights -Consider cardiology consult -Lasix 40 mg IV -Echocardiogram -SLE labs-ANA, anti-DNA antibody ds, C3 -PT/PTT -Hemoglobin A1c, lipid panel, TSH -UA, UDS -AM CBC, CMP  Hypertension, Poorly Controlled Blood pressure ranges of 124-213/73-144.  Received hydralazine 20 mg injection x1, Lasix 40 mg IV.  Home medication of nifedipine 30 mg daily, although unclear from the medication reconciliation if she is actually on this medication. She says she took her antihypertensive today.   -Monitor blood pressures -Start 10 mg amlodipine   Leg pain/swelling Patient has been having leg pain with exertion.  On examination has 1+ pitting edema to knees bilaterally.  No erythema or swelling of calves.  Received DVT  ultrasound  which was negative.  Likely secondary to acute heart failure. 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 Possibly represents a small amount of thymic tissue and may represent thymic hyperplasia.  Does have family history of thyroid disease in her mother.  Denies any known autoimmune disorders in family.  Differentials include lupus, myasthenia gravis, Graves' disease.  Can consider malignancy however patient has not lost weight but has been having increased shortness of breath. -TSH  Polydipsia   Polyuria Patient says she has been having increased water intake about every hour and drinking 8 ounces for the past 2 weeks.  She has also been urinating more than her normal.  Beta-hCG was negative.  Can consider undiagnosed diabetes. Na normal.  -A1c -UA -Monitor electrolytes -Strict I's and O's  Multiple miscarriages [id number].  Has had 2 ectopic pregnancies and 2 spontaneous abortions per chart review.  Her father (has passed away) does have a clotting disorder although she is unsure which disorder this is.  Says that she has very heavy periods and uses up to 4 pads a day. Can consider antiphospholipid syndrome in the setting of possible lupus.  Could also be related to potential thyroid disease. -PT/PTT -Follow-up lupus labs  Microcytic Anemia Hemoglobin of 10 with MCV 77. Suspect iron deficiency anemia.  -Monitor on CBC -Consider iron panel  Left hemiliver cyst On anterior surface and likely a benign cyst, per radiology.  No abdominal pain or masses palpated on examination. -Consider abdominal ultrasound  Transaminitis, mild AST within normal limits of 37 and ALT of 50.  Patient denies any frequent alcohol use, her last drink was 4 weeks ago.  Did have a left hemiliver cyst on CTA. -Monitor on  CMP  Hypokalemia Potassium of 3.4 in ED. -Replete 40 meq -Monitor with daily BMP   Social Needs  Does not have a PCP has not had a visit since last year. - TOC consult  for PCP needs   FEN/GI: Heart healthy Prophylaxis: Lovenox  Disposition: Med telemetry  History of Present Illness:  Alice Hays is a 41 y.o. female presenting with shortness of breath.  Reports being at home today and cleaning up and it taking more time to clean up and she couldn't breathe.  She felt like her shortness of breath is worsening. 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  Has been gaining weight (11-12 lbs) without trying in the last month. Has been having pain in her legs and back that is worse with walking. Does say she has chest pain when she walks.It is hard to talk for her well in the room "breathing wise."  Is like her symptoms are worse with laying flat.  Has swelling in both of her legs. Denies chest pain, vision changes currently.    She has been drinking more water than usual; about 8 oz every hour for the past 2 weeks. Endorses intermittent bloating in her abdominal.  Has been having increased urination in her normal (close to every hour or 2 while at work whereas previously she was going about 3-4 times daily).  Denies any dysuria or hematuria.   Her dad had "heart problems and clotting disorder".  Family members have Type 2 diabetes. Denies tobacco use, illicit drug use. She drinks alcohol socially at special events. She had a beer a  couple weeks ago.   Review Of Systems: Per HPI with the following additions:   Review of Systems  Constitutional:  Negative for fever.  HENT:  Positive for sore throat.   Eyes:  Negative for visual disturbance.  Respiratory:  Positive for cough and shortness of breath.   Cardiovascular:  Positive for leg swelling. Negative for chest pain.  Gastrointestinal:  Positive for abdominal distention. Negative for  constipation, diarrhea, nausea and vomiting.  Endocrine: Positive for polydipsia.  Genitourinary:  Negative for dysuria and hematuria.  Musculoskeletal:  Positive for back pain.  Neurological:  Positive for headaches. Negative for light-headedness.    Patient Active Problem List   Diagnosis Date Noted   Miscarriage 01/28/2021   Essential hypertension 01/02/2019   Labor abnormal 06/29/2016    Postpartum care s/p cesarean section (9/2) 06/29/2016    Past Medical History: Past Medical History:  Diagnosis Date   Anemia    Ectopic pregnancy    L adnexa   Hx of varicella    Hypertension     Past Surgical History: Past Surgical History:  Procedure Laterality Date   ARM SURGERY     CESAREAN SECTION N/A 06/29/2016   Procedure: CESAREAN SECTION;  Surgeon: Servando Salina, MD;  Location: [facility];  Service: Obstetrics;  Laterality: N/A;    Social History: Social History   Tobacco Use   Smoking status: Never   Smokeless tobacco: Never  Vaping Use   Vaping Use: Never used  Substance Use Topics   Alcohol use: No   Drug use: No   Additional social history: per HPI  Please also refer to relevant sections of EMR.  Family History: Family History  Problem Relation Age of Onset   Cancer Mother    Heart disease Mother    Heart disease Father    Hypertension Father    Stroke Father    Cancer Maternal Aunt    Diabetes Maternal Grandmother     Allergies and Medications: Allergies  Allergen Reactions   Shellfish Allergy Anaphylaxis   No current facility-administered medications on file prior to encounter.   Current Outpatient Medications on File Prior to Encounter  Medication Sig Dispense Refill   Multiple Vitamin (MV-ONE PO) Take 1 capsule by mouth daily.     cyclobenzaprine (FLEXERIL) 5 MG tablet Take 1 tablet (5 mg total) by mouth 3 (three) times daily as needed for muscle spasms. (Patient not taking: Reported on 11/28/2021) 21 tablet 0   medroxyPROGESTERone  (DEPO-PROVERA) 150 MG/ML injection Inject 1 mL (150 mg total) into the muscle every 13 weeks (Patient not taking: Reported on 11/28/2021) 1 mL 2   naproxen (NAPROSYN) 375 MG tablet Take 1 tablet (375 mg total) by mouth 2 (two) times daily as needed. (Patient not taking: Reported on 11/28/2021) 20 tablet 0   NIFEdipine (PROCARDIA-XL/NIFEDICAL-XL) 30 MG 24 hr tablet Take 1 tablet (30 mg total) by mouth daily. (Patient not taking: Reported on 11/28/2021) 30 tablet 0   Prenatal Vit-Fe Fumarate-FA (PRENATAL MULTIVITAMIN) TABS tablet Take 1 tablet by mouth daily at 12 noon.  (Patient not taking: Reported on 11/28/2021)      Objective: BP (!) 168/97    Pulse 94    Temp 98.5 F (36.9 C) (Oral)    Resp (!) 21    Ht 4\' 10"  (1.473 m)    LMP 10/07/2021 (Approximate)    SpO2 100%    BMI 37.83 kg/m  Exam: General: Sitting upright in bed, mildly dyspneic after speaking, nontoxic, alert and  oriented, responsive to questions Eyes: No erythema or scleral icterus, no exophthalmos ENTM: No nasal drainage, no chapped lips, no neck mass visualized Neck: Range of motion intact Cardiovascular: RRR no murmurs rubs or gallops, 2+ pulses bilaterally in radial and lower extremity, brisk capillary refill, no appreciable JVP  Respiratory: Clear to auscultation bilaterally no wheezes rales or crackles, mild dyspnea after speaking long sentences, sitting upright in bed Gastrointestinal: Mild fluid wave on examination, soft, mildly distended, nontender to palpation, no masses palpated MSK: 1+ pitting edema to knees bilaterally, no unilateral calf swelling or erythema Derm: No facial rashes, or other rashes noted on examination no lesions noted Neuro: No focal deficits Psych: Mood appropriate, pleasant  Labs and Imaging: CBC BMET  Recent Labs  Lab 11/28/21 1617 11/28/21 1732  WBC 8.5  --   HGB 10.0* 10.9*  HCT 35.5* 32.0*  PLT 333  --    Recent Labs  Lab 11/28/21 1617 11/28/21 1732  NA 136 135  K 4.0 3.4*  CL 103  --    CO2 25  --   BUN 13  --   CREATININE 0.84  --   GLUCOSE 109*  --   CALCIUM 8.5*  --      EKG: sinus tachycardia without acute ST changes  CT Angio Chest PE W/Cm &/Or Wo Cm  Result Date: 11/28/2021 CLINICAL DATA:  41 year old female presents with shortness of breath and leg pain. EXAM: CT ANGIOGRAPHY CHEST WITH CONTRAST TECHNIQUE: Multidetector CT imaging of the chest was performed using the standard protocol during bolus administration of intravenous contrast. Multiplanar CT image reconstructions and MIPs were obtained to evaluate the vascular anatomy. RADIATION DOSE REDUCTION: This exam was performed according to the departmental dose-optimization program which includes automated exposure control, adjustment of the mA and/or kV according to patient size and/or use of iterative reconstruction technique. CONTRAST:  1mL OMNIPAQUE IOHEXOL 350 MG/ML SOLN COMPARISON:  None FINDINGS: Cardiovascular: Question of mild pericardial thickening. Heart size is enlarged. Moderately to markedly enlarged accounting for low lung volumes. Aortic caliber is normal. Central pulmonary vasculature is opacified to 314 Hounsfield units. Pulmonary vascular bed evaluated to the segmental level without evidence of pulmonary embolism Mediastinum/Nodes: Esophagus is grossly normal. There is no thoracic inlet lymphadenopathy. No axillary lymphadenopathy. No hilar lymphadenopathy. Nodular appearing anterior mediastinal soft tissue is noted along the anterior surface of the heart. This measures approximately 1.51.7 cm greatest thickness no discrete adenopathy along the RIGHT paratracheal chain or in the subcarinal region Lungs/Pleura: Small bilateral pleural effusions. Mosaic attenuation in the lung parenchyma towards the lung apices. Mild basilar atelectasis and signs of septal thickening. Upper Abdomen: Low attenuation in the anterior LEFT hemi liver likely a small cyst not well characterized on the current exam and measuring  greater than 30 Hounsfield units (image 126/5) this measures 15 mm. Infiltration of the fat about the adrenal glands may be present though low-dose evaluation limits assessment of this area. No additional findings of note in the upper abdomen. Musculoskeletal: No chest wall abnormality. No acute or significant osseous findings. Review of the MIP images confirms the above findings. IMPRESSION: 1. No evidence of pulmonary embolism to the segmental level. 2. Findings of congestive heart failure with edema, septal thickening and small bilateral pleural effusions. Cardiomegaly, potentially representing cardiomyopathy in a patient of this age. 3. Nodular appearing anterior mediastinal soft tissue is noted along the anterior surface of the heart. This measures approximately 1.5-1.7 cm greatest thickness. This may represent a small amount of thymic  tissue and may represent thymic hyperplasia in the absence of any prior history of neoplasm. Consider correlation with thyroid function as Grave's disease as an association with both thymic hyperplasia and cardiac dysfunction. Would consider three-month follow-up with either CT or MRI to ensure resolution and or stability of anterior mediastinal soft tissue. Would also correlate with any known history of autoimmune disorder a could lead to cardiac dysfunction and thymic hyperplasia such as lupus. 4. Low attenuation along the anterior surface of the LEFT hemi liver is almost certainly a benign cyst though with attenuation values in the range of 30 Hounsfield units consider follow-up abdominal ultrasound four complete characterization. 5. Subtle stranding in the retroperitoneal fat of the upper abdomen may be related to generalized edema developing in the setting of cardiac dysfunction. Correlate with any abdominal symptoms with abdominal imaging as warranted for further assessment. Electronically Signed   By: Zetta Bills M.D.   On: 11/28/2021 21:47   DG Chest Port 1  View  Result Date: 11/28/2021 CLINICAL DATA:  Shortness of breath EXAM: PORTABLE CHEST 1 VIEW COMPARISON:  Chest x-ray dated August 04, 2021 FINDINGS: Increased enlargement of the cardiac contours compared with prior exam, although evaluation is somewhat limited due to differences in technique. Mild bilateral interstitial opacities. No large pleural effusion or pneumothorax. IMPRESSION: 1. Increased enlargement of the cardiac contours when compared with prior exam. Recommend echocardiography for further evaluation. 2. Mild bilateral interstitial opacities, likely due to pulmonary edema. Electronically Signed   By: Yetta Glassman M.D.   On: 11/28/2021 17:29   VAS [place] LOWER EXTREMITY VENOUS (DVT) (7a-7p)  Result Date: 11/28/2021  Lower Venous DVT Study Patient Name:  SAYDE TOP  Date of Exam:   11/28/2021 Medical Rec #: [id number]           Accession #:    [id number] Date of Birth: 09-Jul-1981           Patient Gender: F Patient Age:   52 years Exam Location:  [facility] Procedure:      VAS [place] LOWER EXTREMITY VENOUS (DVT) Referring Phys: JOSHUA GEIPLE --------------------------------------------------------------------------------  Indications: Edema, and SOB.  Comparison Study: No prior study Performing Technologist: Maudry Mayhew MHA, RDMS, RVT, RDCS  Examination Guidelines: A complete evaluation includes B-mode imaging, spectral Doppler, color Doppler, and power Doppler as needed of all accessible portions of each vessel. Bilateral testing is considered an integral part of a complete examination. Limited examinations for reoccurring indications may be performed as noted. The reflux portion of the exam is performed with the patient in reverse Trendelenburg.  +-----+---------------+---------+-----------+----------+--------------+  RIGHT Compressibility Phasicity Spontaneity Properties Thrombus Aging  +-----+---------------+---------+-----------+----------+--------------+  CFV   Full             Yes       Yes                                    +-----+---------------+---------+-----------+----------+--------------+   +---------+---------------+---------+-----------+----------+--------------+  LEFT      Compressibility Phasicity Spontaneity Properties Thrombus Aging  +---------+---------------+---------+-----------+----------+--------------+  CFV       Full            Yes       Yes                                    +---------+---------------+---------+-----------+----------+--------------+  SFJ  Full                                                             +---------+---------------+---------+-----------+----------+--------------+  FV Prox   Full                                                             +---------+---------------+---------+-----------+----------+--------------+  FV Mid    Full                                                             +---------+---------------+---------+-----------+----------+--------------+  FV Distal Full                                                             +---------+---------------+---------+-----------+----------+--------------+  PFV       Full                                                             +---------+---------------+---------+-----------+----------+--------------+  POP       Full            Yes       Yes                                    +---------+---------------+---------+-----------+----------+--------------+  PTV       Full                                                             +---------+---------------+---------+-----------+----------+--------------+  PERO      Full                                                             +---------+---------------+---------+-----------+----------+--------------+     Summary: RIGHT: - No evidence of common femoral vein obstruction.  LEFT: - There is no evidence of deep vein thrombosis in the lower extremity.  - No cystic structure found in the popliteal fossa. -Pulsatile lower extremity  venous flow is suggestive of possibly elevated right heart pressure.  *See table(s) above for measurements and observations.    Preliminary  Gerrit Heck, MD 11/28/2021, 10:07 PM PGY-1, [facility] pager: [phone], text pages welcome   FPTS Upper-Level Resident Addendum   I have independently interviewed and examined the patient. I have discussed the above with the original author and agree with their documentation. My edits for correction/addition/clarification are in within the document. Please see also any attending notes.   Lyndee Hensen, DO PGY-3, [facility] 11/29/2021 6:09 AM  FPTS Service pager: [phone] (text pages welcome through [facility])

## 2021-11-29 ENCOUNTER — Observation Stay (HOSPITAL_COMMUNITY): Payer: BC Managed Care – PPO

## 2021-11-29 ENCOUNTER — Other Ambulatory Visit (HOSPITAL_COMMUNITY): Payer: Self-pay

## 2021-11-29 DIAGNOSIS — J9859 Other diseases of mediastinum, not elsewhere classified: Secondary | ICD-10-CM | POA: Diagnosis not present

## 2021-11-29 DIAGNOSIS — I10 Essential (primary) hypertension: Secondary | ICD-10-CM | POA: Diagnosis not present

## 2021-11-29 DIAGNOSIS — Z833 Family history of diabetes mellitus: Secondary | ICD-10-CM | POA: Diagnosis not present

## 2021-11-29 DIAGNOSIS — E320 Persistent hyperplasia of thymus: Secondary | ICD-10-CM | POA: Diagnosis present

## 2021-11-29 DIAGNOSIS — I509 Heart failure, unspecified: Secondary | ICD-10-CM | POA: Diagnosis present

## 2021-11-29 DIAGNOSIS — E876 Hypokalemia: Secondary | ICD-10-CM | POA: Diagnosis present

## 2021-11-29 DIAGNOSIS — I110 Hypertensive heart disease with heart failure: Secondary | ICD-10-CM | POA: Diagnosis present

## 2021-11-29 DIAGNOSIS — I517 Cardiomegaly: Secondary | ICD-10-CM | POA: Diagnosis not present

## 2021-11-29 DIAGNOSIS — K7689 Other specified diseases of liver: Secondary | ICD-10-CM | POA: Diagnosis present

## 2021-11-29 DIAGNOSIS — I5021 Acute systolic (congestive) heart failure: Secondary | ICD-10-CM | POA: Diagnosis present

## 2021-11-29 DIAGNOSIS — Z79899 Other long term (current) drug therapy: Secondary | ICD-10-CM | POA: Diagnosis not present

## 2021-11-29 DIAGNOSIS — Z8249 Family history of ischemic heart disease and other diseases of the circulatory system: Secondary | ICD-10-CM | POA: Diagnosis not present

## 2021-11-29 DIAGNOSIS — R222 Localized swelling, mass and lump, trunk: Secondary | ICD-10-CM | POA: Diagnosis present

## 2021-11-29 DIAGNOSIS — R7989 Other specified abnormal findings of blood chemistry: Secondary | ICD-10-CM | POA: Diagnosis present

## 2021-11-29 DIAGNOSIS — I422 Other hypertrophic cardiomyopathy: Secondary | ICD-10-CM | POA: Diagnosis present

## 2021-11-29 DIAGNOSIS — D509 Iron deficiency anemia, unspecified: Secondary | ICD-10-CM | POA: Diagnosis present

## 2021-11-29 DIAGNOSIS — J9601 Acute respiratory failure with hypoxia: Secondary | ICD-10-CM

## 2021-11-29 DIAGNOSIS — Z20822 Contact with and (suspected) exposure to covid-19: Secondary | ICD-10-CM | POA: Diagnosis present

## 2021-11-29 DIAGNOSIS — I160 Hypertensive urgency: Secondary | ICD-10-CM | POA: Diagnosis present

## 2021-11-29 DIAGNOSIS — Z91013 Allergy to seafood: Secondary | ICD-10-CM | POA: Diagnosis not present

## 2021-11-29 LAB — CBC
HCT: 35.2 % — ABNORMAL LOW (ref 36.0–46.0)
Hemoglobin: 10.4 g/dL — ABNORMAL LOW (ref 12.0–15.0)
MCH: 22.2 pg — ABNORMAL LOW (ref 26.0–34.0)
MCHC: 29.5 g/dL — ABNORMAL LOW (ref 30.0–36.0)
MCV: 75.1 fL — ABNORMAL LOW (ref 80.0–100.0)
Platelets: 340 10*3/uL (ref 150–400)
RBC: 4.69 MIL/uL (ref 3.87–5.11)
RDW: 19.7 % — ABNORMAL HIGH (ref 11.5–15.5)
WBC: 8.6 10*3/uL (ref 4.0–10.5)
nRBC: 0 % (ref 0.0–0.2)

## 2021-11-29 LAB — ECHOCARDIOGRAM COMPLETE
Area-P 1/2: 3.43 cm2
Height: 58 in
MV M vel: 6.08 m/s
MV Peak grad: 147.9 mmHg
Radius: 0.4 cm
S' Lateral: 4.2 cm

## 2021-11-29 LAB — HIV ANTIBODY (ROUTINE TESTING W REFLEX): HIV Screen 4th Generation wRfx: NONREACTIVE

## 2021-11-29 LAB — COMPREHENSIVE METABOLIC PANEL
ALT: 47 U/L — ABNORMAL HIGH (ref 0–44)
AST: 45 U/L — ABNORMAL HIGH (ref 15–41)
Albumin: 3.1 g/dL — ABNORMAL LOW (ref 3.5–5.0)
Alkaline Phosphatase: 57 U/L (ref 38–126)
Anion gap: 7 (ref 5–15)
BUN: 10 mg/dL (ref 6–20)
CO2: 23 mmol/L (ref 22–32)
Calcium: 8.3 mg/dL — ABNORMAL LOW (ref 8.9–10.3)
Chloride: 107 mmol/L (ref 98–111)
Creatinine, Ser: 0.8 mg/dL (ref 0.44–1.00)
GFR, Estimated: >60 mL/min (ref >60)
Glucose, Bld: 103 mg/dL — ABNORMAL HIGH (ref 70–99)
Potassium: 4.5 mmol/L (ref 3.5–5.1)
Sodium: 137 mmol/L (ref 135–145)
Total Bilirubin: 1.1 mg/dL (ref 0.3–1.2)
Total Protein: 6 g/dL — ABNORMAL LOW (ref 6.5–8.1)

## 2021-11-29 LAB — RESP PANEL BY RT-PCR (FLU A&B, COVID) ARPGX2
Influenza A by PCR: NEGATIVE
Influenza B by PCR: NEGATIVE
SARS Coronavirus 2 by RT PCR: NEGATIVE

## 2021-11-29 LAB — LIPID PANEL
Cholesterol: 208 mg/dL — ABNORMAL HIGH (ref 0–200)
HDL: 46 mg/dL (ref >40)
LDL Cholesterol: 147 mg/dL — ABNORMAL HIGH (ref 0–99)
Total CHOL/HDL Ratio: 4.5 RATIO
Triglycerides: 77 mg/dL (ref <150)
VLDL: 15 mg/dL (ref 0–40)

## 2021-11-29 LAB — RAPID URINE DRUG SCREEN, HOSP PERFORMED
Amphetamines: NOT DETECTED
Barbiturates: NOT DETECTED
Benzodiazepines: NOT DETECTED
Cocaine: NOT DETECTED
Opiates: NOT DETECTED
Tetrahydrocannabinol: NOT DETECTED

## 2021-11-29 LAB — RETICULOCYTES
Immature Retic Fract: 34 % — ABNORMAL HIGH (ref 2.3–15.9)
RBC.: 4.68 MIL/uL (ref 3.87–5.11)
Retic Count, Absolute: 104.4 10*3/uL (ref 19.0–186.0)
Retic Ct Pct: 2.2 % (ref 0.4–3.1)

## 2021-11-29 LAB — URINALYSIS, ROUTINE W REFLEX MICROSCOPIC
Bilirubin Urine: NEGATIVE
Glucose, UA: NEGATIVE mg/dL
Hgb urine dipstick: NEGATIVE
Ketones, ur: NEGATIVE mg/dL
Leukocytes,Ua: NEGATIVE
Nitrite: NEGATIVE
Protein, ur: NEGATIVE mg/dL
Specific Gravity, Urine: 1.015 (ref 1.005–1.030)
pH: 8 (ref 5.0–8.0)

## 2021-11-29 LAB — PROTIME-INR
INR: 1 (ref 0.8–1.2)
Prothrombin Time: 13.3 seconds (ref 11.4–15.2)

## 2021-11-29 LAB — IRON AND TIBC
Iron: 55 ug/dL (ref 28–170)
Saturation Ratios: 13 % (ref 10.4–31.8)
TIBC: 420 ug/dL (ref 250–450)
UIBC: 365 ug/dL

## 2021-11-29 LAB — FERRITIN: Ferritin: 6 ng/mL — ABNORMAL LOW (ref 11–307)

## 2021-11-29 LAB — FOLATE: Folate: 28 ng/mL (ref >5.9)

## 2021-11-29 LAB — TSH: TSH: 1.5 u[IU]/mL (ref 0.350–4.500)

## 2021-11-29 LAB — VITAMIN B12: Vitamin B-12: 722 pg/mL (ref 180–914)

## 2021-11-29 LAB — APTT: aPTT: 26 seconds (ref 24–36)

## 2021-11-29 MED ORDER — LOSARTAN POTASSIUM 25 MG PO TABS: 25.0000 mg | ORAL_TABLET | Freq: Every day | ORAL | Status: DC

## 2021-11-29 MED ORDER — SODIUM CHLORIDE 0.9% FLUSH
3.0000 mL | Freq: Two times a day (BID) | INTRAVENOUS | Status: DC
  Administered 2021-11-29: 3 mL via INTRAVENOUS

## 2021-11-29 MED ORDER — SODIUM CHLORIDE 0.9% FLUSH: 3.0000 mL | INTRAVENOUS | Status: DC | PRN

## 2021-11-29 MED ORDER — FUROSEMIDE 10 MG/ML IJ SOLN
40.0000 mg | Freq: Once | INTRAMUSCULAR | Status: AC
  Administered 2021-11-29: 40 mg via INTRAVENOUS
  Filled 2021-11-29: qty 4

## 2021-11-29 MED ORDER — AMLODIPINE BESYLATE 5 MG PO TABS
10.0000 mg | ORAL_TABLET | Freq: Once | ORAL | Status: AC
  Administered 2021-11-29: 10 mg via ORAL
  Filled 2021-11-29: qty 2

## 2021-11-29 MED ORDER — POTASSIUM CHLORIDE 20 MEQ PO PACK
40.0000 meq | PACK | Freq: Once | ORAL | Status: AC
  Administered 2021-11-29: 40 meq via ORAL
  Filled 2021-11-29: qty 2

## 2021-11-29 MED ORDER — ACETAMINOPHEN 325 MG PO TABS: 650.0000 mg | ORAL_TABLET | Freq: Four times a day (QID) | ORAL | Status: DC | PRN

## 2021-11-29 MED ORDER — ADULT MULTIVITAMIN W/MINERALS CH
1.0000 | ORAL_TABLET | Freq: Every day | ORAL | Status: DC
  Administered 2021-11-29 – 2021-12-03 (×5): 1 via ORAL
  Filled 2021-11-29 (×5): qty 1

## 2021-11-29 MED ORDER — HYDRALAZINE HCL 20 MG/ML IJ SOLN
10.0000 mg | Freq: Once | INTRAMUSCULAR | Status: AC
  Administered 2021-11-29: 10 mg via INTRAVENOUS
  Filled 2021-11-29: qty 1

## 2021-11-29 MED ORDER — SODIUM CHLORIDE 0.9 % IV SOLN: 250.0000 mL | INTRAVENOUS | Status: DC | PRN

## 2021-11-29 MED ORDER — SODIUM CHLORIDE 0.9 % IV SOLN: INTRAVENOUS | Status: DC

## 2021-11-29 MED ORDER — ACETAMINOPHEN 650 MG RE SUPP: 650.0000 mg | Freq: Four times a day (QID) | RECTAL | Status: DC | PRN

## 2021-11-29 MED ORDER — FUROSEMIDE 10 MG/ML IJ SOLN
60.0000 mg | Freq: Two times a day (BID) | INTRAMUSCULAR | Status: AC
  Administered 2021-11-29 – 2021-11-30 (×2): 60 mg via INTRAVENOUS
  Filled 2021-11-29 (×2): qty 6

## 2021-11-29 MED ORDER — SPIRONOLACTONE 12.5 MG HALF TABLET
12.5000 mg | ORAL_TABLET | Freq: Every day | ORAL | Status: DC
  Administered 2021-11-29: 12.5 mg via ORAL
  Filled 2021-11-29: qty 1

## 2021-11-29 MED ORDER — AMLODIPINE BESYLATE 5 MG PO TABS: 10.0000 mg | ORAL_TABLET | Freq: Every day | ORAL | Status: DC

## 2021-11-29 MED ORDER — ENOXAPARIN SODIUM 40 MG/0.4ML IJ SOSY
40.0000 mg | PREFILLED_SYRINGE | INTRAMUSCULAR | Status: DC
  Administered 2021-11-29: 40 mg via SUBCUTANEOUS
  Filled 2021-11-29: qty 0.4

## 2021-11-29 MED ORDER — SODIUM CHLORIDE 0.9 % IV SOLN
510.0000 mg | Freq: Once | INTRAVENOUS | Status: AC
  Administered 2021-11-29: 510 mg via INTRAVENOUS
  Filled 2021-11-29: qty 510

## 2021-11-29 MED ORDER — ASPIRIN 81 MG PO CHEW
81.0000 mg | CHEWABLE_TABLET | ORAL | Status: AC
  Administered 2021-11-30: 81 mg via ORAL
  Filled 2021-11-29: qty 1

## 2021-11-29 NOTE — Plan of Care (Signed)
Problem: Activity: Goal: Capacity to carry out activities will improve Outcome: Completed/Met   

## 2021-11-29 NOTE — Progress Notes (Signed)
Family Medicine Teaching Service Daily Progress Note Intern Pager: [phone]  Patient name: Alice Hays Medical record number: [id number] Date of birth: 09-27-1981 Age: 41 y.o. Gender: female  Primary Care Provider: Pcp, No Consultants: None Code Status: Full  Pt Overview and Major Events to Date:  11/29/21: Admit to FPTS   Assessment and Plan:  Alice Hays is a 41 yo female presenting with SOB and swelling. PMHx significant HTN, anemia, multiple miscarriages.  Acute respiratory Distress   New onset heart failure ? Current oxygen requirement of 2L, no oxygen requirement at home. Initial BNP was elevated, concerning for cardiac etiology (acute CHF?) given clinical picture with bilateral lung base crackles on exam and chest XR (enlarged heart border that is significant compared to previous chest XR in October 2022). Also, consider other differentials such as sarcoidosis, cardiac effusion, pericarditis, amyloidosis given severity of cardiac enlargement from CXR. Nifedipine 30 mg is listed med list, but medication reconciliation showed that she has not been taking this medication so unlikely cause of swelling in lower extremities. Lupus/thyroid disease  is present on differential (autoimmune disease) due to anterior mediastinal mass. Thyroid dx unlikely cause as TSH is 1.5. Negative for PE via CTA. Continue with echo and follow up on auto-immunity labs. Cardiology consult placed. -Echocardiogram  -SLE labs: ANA, anti-DNA antibody ds, C3  -Consult cardiology for recommendations -HA1C, lipid panel -UA, UDS -See anterior mediastinal mass below  HTN, poorly controlled  Blood pressure this AM 160-180s/100s-120s, Unsure if she is taking amlodipine at home per med rec. Discontinuing amlodipine in setting of possible CHF. Will start Losartan 25 mg, depending on echo and will possibly start medications such as coreg, entresto, farxiga (for CHF).  -Await echo -Consider losartan   Leg pain  and swelling  Received DVT U/S that was negative. Appears that the patient is volume overloaded, causing concern for CHF. Today swelling has decreased but pt still seems overloaded on my exam via crackles in lung bases.  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 CT surgery will be consulted to assess need for biopsy or further work up of possibly incidental findings.   Polydipsia   Polyuria  Beta-hCG negative. Will obtain HA1C.  -UA and A1C pending   Multiple Miscarriages [id number]. Father has clotting disorder per pt (?). PTT and PT normal. Unknown if anti phospholipid syndrome causes cardiac changes or could cause onset of symptoms.   IDA Ferritin of 6,  iron supplementation is needed given depletion of ferritin stores. Will reassess and likely give IV iron once cardiac enlargement is further assessed.  Left hemiliver cyst  Likely benign per radiology (15 mm in size). Unless clinically becomes concerning, will hold off on abd U/S.  Transaminitis, mild  AST 37--->45 ALT 50 --->47. Follow up with CMP, stable.   Hypokalemia, resolved  K 4.5 this AM after repletion, will monitor   Social work consulted for PCP as she does not have one.   FEN/GI: Heart Healthy  PPx: Lovenox  Dispo:Home pending clinical improvement . Barriers include further work up.   Subjective:  Pt is unchanged from last night when she was admitted. She still reported some SOB and denies any pain.   Objective: Temp:  [98.5 F (36.9 C)] 98.5 F (36.9 C) (02/01 1608) Pulse Rate:  [88-107] 95 (02/02 0600) Resp:  [20-35] 23 (02/02 0600)  BP: (124-213)/(73-144) 182/127 (02/02 0609) SpO2:  [96 %-100 %] 98 % (02/02 0600) General: Alert and oriented in no apparent distress, on 2 L [place].  Heart: Regular rate and rhythm with no murmurs appreciated Lungs: Crackles in bilateral  lung bases with mild increased WOB. Otherwise good aeration Abdomen: Bowel sounds present, no abdominal pain Skin: Warm and dry Extremities: 1+ pitting edema in the bilateral lower extremities to mid calf    Laboratory: Recent Labs  Lab 11/28/21 1617 11/28/21 1732 11/29/21 0537  WBC 8.5  --  8.6  HGB 10.0* 10.9* 10.4*  HCT 35.5* 32.0* 35.2*  PLT 333  --  340   Recent Labs  Lab 11/28/21 1617 11/28/21 1732  NA 136 135  K 4.0 3.4*  CL 103  --   CO2 25  --   BUN 13  --   CREATININE 0.84  --   CALCIUM 8.5*  --   PROT 5.8*  --   BILITOT 0.5  --   ALKPHOS 66  --   ALT 50*  --   AST 37  --   GLUCOSE 109*  --      Erskine Emery, MD 11/29/2021, 6:39 AM PGY-1, [place] Intern pager: [phone], text pages welcome

## 2021-11-29 NOTE — Progress Notes (Signed)
°  Echocardiogram 2D Echocardiogram has been performed.  Alice Hays M 11/29/2021, 9:50 AM

## 2021-11-29 NOTE — Progress Notes (Signed)
TCTS consult received and Dr. Cliffton Asters reviewed patients notes and CTA.  Nothing is needed from [place] at this time and we will see patient back in clinic in several weeks to further discuss mediastinal mass.  At that time we will probably order tumor markers and a repeat scan to be done in 3 months but will make official decision once we see her in the outpatient setting.  I gave Dr. Wynelle Link this information as well.  We will schedule an outpatient appt here and call & mail her with info.

## 2021-11-29 NOTE — Consult Note (Addendum)
Advanced Heart Failure Team Consult Note   Primary Physician: Pcp, No PCP-Cardiologist:  None  Reason for Consultation: New systolic CHF  HPI:    Alice Hays is seen today for evaluation of new systolic CHF at the request of Dr. Nori Riis with [facility] teaching service. 41 y.o. female with history of HTN and multiple miscarriages (2 ectopic pregnancies + 2 spontaneous abortions).   Presented to ED yesterday with complaints of worsening dyspnea X 1 month. She reports she has gained about 11 lb. Notes orthopnea, PND, LE edema and abdominal bloating. Has been unable to keep up with duties at work.  Has been out of her BP medications for at least several months, she is in process of establishing with a PCP. She was markedly hypertensive with SBP 213/140, HR 107, RR 24. BNP 1267, HS troponin 26.25. Chest xray with evidence of pulmonary edema. She appeared tachypneic and initially required BiPAP.Given IV hydralazine and IV lasix. CTA negative for PE. Incidentally noted pulmonary edema, small pleural effusions and nodular appearance anterior mediastinal soft tissue density (? Thymic hyperplasia).   She was admitted under Family Medicine teaching service for management of acute respiratory failure with hypoxia and new acute systolic CHF.  Echo today EF 45%, RV mildly reduced, moderate MR.  SLE serologies pending. CTS consulted regarding anterior mediastinal soft tissue density.  Received second dose of 40 mg lasix IV again today.1500 cc UOP charted so far. Dyspnea slightly improved. Still winded with conversation and minimal activity. Feels bloated. No CP, palpitations or presyncope.  Has family history of CHF in her father and CAD in maternal grandfather.  Works at [employer]. Rare alcohol consumption. No tobacco or drug use.  Review of Systems: [y] = yes, [ ]  = no   General: Weight gain [Y]; Weight loss [ ] ; Anorexia [ ] ; Fatigue [Y]; Fever [ ] ; Chills [ ] ; Weakness [ ]   Cardiac: Chest  pain/pressure [ ] ; Resting SOB [ ] ; Exertional SOB [Y]; Orthopnea [Y]; Pedal Edema [Y]; Palpitations [ ] ; Syncope [ ] ; Presyncope [ ] ; Paroxysmal nocturnal dyspnea[Y ]  Pulmonary: Cough [ ] ; Wheezing[ ] ; Hemoptysis[ ] ; Sputum [ ] ; Snoring [ ]   GI: Vomiting[ ] ; Dysphagia[ ] ; Melena[ ] ; Hematochezia [ ] ; Heartburn[ ] ; Abdominal pain [ ] ; Constipation [ ] ; Diarrhea [ ] ; BRBPR [ ]   GU: Hematuria[ ] ; Dysuria [ ] ; Nocturia[ ]   Vascular: Pain in legs with walking [ ] ; Pain in feet with lying flat [ ] ; Non-healing sores [ ] ; Stroke [ ] ; TIA [ ] ; Slurred speech [ ] ;  Neuro: Headaches[ ] ; Vertigo[ ] ; Seizures[ ] ; Paresthesias[ ] ;Blurred vision [ ] ; Diplopia [ ] ; Vision changes [ ]   Ortho/Skin: Arthritis [ ] ; Joint pain [ ] ; Muscle pain [ ] ; Joint swelling [ ] ; Back Pain [ ] ; Rash [ ]   Psych: Depression[ ] ; Anxiety[ ]   Heme: Bleeding problems [ ] ; Clotting disorders [ ] ; Anemia [ ]   Endocrine: Diabetes [ ] ; Thyroid dysfunction[ ]   Home Medications Prior to Admission medications   Medication Sig Start Date End Date Taking? Authorizing Provider  Multiple Vitamin (MV-ONE PO) Take 1 capsule by mouth every morning.   Yes [provider]  PRESCRIPTION MEDICATION Take 1 tablet by mouth daily. Red tablet for blood pressure   Yes [provider]  cyclobenzaprine (FLEXERIL) 5 MG tablet Take 1 tablet (5 mg total) by mouth 3 (three) times daily as needed for muscle spasms. Patient not taking: Reported on 11/28/2021 10/04/21   Jonah Blue,  Dionne Bucy, NP  hydrochlorothiazide (HYDRODIURIL) 25 MG tablet Take 25 mg by mouth daily. Patient not taking: Reported on 11/29/2021    [provider]  medroxyPROGESTERone (DEPO-PROVERA) 150 MG/ML injection Inject 1 mL (150 mg total) into the muscle every 13 weeks Patient not taking: Reported on 11/28/2021 02/06/21     naproxen (NAPROSYN) 375 MG tablet Take 1 tablet (375 mg total) by mouth 2 (two) times daily as needed. Patient not taking: Reported on 11/28/2021 10/04/21    Carvel Getting, NP    Past Medical History: Past Medical History:  Diagnosis Date   Anemia    Ectopic pregnancy    L adnexa   Hx of varicella    Hypertension     Past Surgical History: Past Surgical History:  Procedure Laterality Date   ARM SURGERY     CESAREAN SECTION N/A 06/29/2016   Procedure: CESAREAN SECTION;  Surgeon: Servando Salina, MD;  Location: [place];  Service: Obstetrics;  Laterality: N/A;    Family History: Family History  Problem Relation Age of Onset   Cancer Mother    Heart disease Mother    Heart disease Father    Hypertension Father    Stroke Father    Cancer Maternal Aunt    Diabetes Maternal Grandmother     Social History: Social History   Socioeconomic History   Marital status: Married    Spouse name: Not on file   Number of children: Not on file   Years of education: Not on file   Highest education level: Not on file  Occupational History   Not on file  Tobacco Use   Smoking status: Never   Smokeless tobacco: Never  Vaping Use   Vaping Use: Never used  Substance and Sexual Activity   Alcohol use: No   Drug use: No   Sexual activity: Yes    Birth control/protection: None  Other Topics Concern   Not on file  Social History Narrative   Not on file   Social Determinants of Health   Financial Resource Strain: Not on file  Food Insecurity: Not on file  Transportation Needs: Not on file  Physical Activity: Not on file  Stress: Not on file  Social Connections: Not on file    Allergies:  Allergies  Allergen Reactions   Shellfish Allergy Anaphylaxis    Objective:    Vital Signs:   Temp:  [98.3 F (36.8 C)-98.6 F (37 C)] 98.6 F (37 C) (02/02 1339) Pulse Rate:  [84-107] 99 (02/02 1339) Resp:  [18-35] 18 (02/02 1339) BP: (124-213)/(73-144) 159/93 (02/02 1339) SpO2:  [96 %-100 %] 100 % (02/02 1339) Weight:  [77.7 kg] 77.7 kg (02/02 1339)    Weight change: Filed Weights   11/29/21 1339  Weight: 77.7 kg     Intake/Output:   Intake/Output Summary (Last 24 hours) at 11/29/2021 1501 Last data filed at 11/29/2021 1426 Gross per 24 hour  Intake 120 ml  Output 1500 ml  Net -1380 ml      Physical Exam    General:  Sitting up in bed. Appears dyspneic with conversation HEENT: normal Neck: sup Cor: PMI nondisplaced. Regular rate & rhythm. No rubs, gallops or murmurs. Lungs: bibasilar crackles Abdomen: soft, nontender, nondistended. No hepatosplenomegaly.  Extremities: no cyanosis, clubbing, rash, 1+ edema Neuro: alert & orientedx3, cranial nerves grossly intact. moves all 4 extremities w/o difficulty. Affect pleasant   Telemetry   SR 80s-90s  EKG    Sinus tachycardia 109 bpm  Labs   Basic Metabolic Panel: Recent Labs  Lab 11/28/21 1617 11/28/21 1732 11/29/21 0537  NA 136 135 137  K 4.0 3.4* 4.5  CL 103  --  107  CO2 25  --  23  GLUCOSE 109*  --  103*  BUN 13  --  10  CREATININE 0.84  --  0.80  CALCIUM 8.5*  --  8.3*    Liver Function Tests: Recent Labs  Lab 11/28/21 1617 11/29/21 0537  AST 37 45*  ALT 50* 47*  ALKPHOS 66 57  BILITOT 0.5 1.1  PROT 5.8* 6.0*  ALBUMIN 3.1* 3.1*   No results for input(s): LIPASE, AMYLASE in the last 168 hours. No results for input(s): AMMONIA in the last 168 hours.  CBC: Recent Labs  Lab 11/28/21 1617 11/28/21 1732 11/29/21 0537  WBC 8.5  --  8.6  HGB 10.0* 10.9* 10.4*  HCT 35.5* 32.0* 35.2*  MCV 77.0*  --  75.1*  PLT 333  --  340    Cardiac Enzymes: No results for input(s): CKTOTAL, CKMB, CKMBINDEX, TROPONINI in the last 168 hours.  BNP: BNP (last 3 results) Recent Labs    11/28/21 1748  BNP 1,267.7*    ProBNP (last 3 results) No results for input(s): PROBNP in the last 8760 hours.   CBG: No results for input(s): GLUCAP in the last 168 hours.  Coagulation Studies: Recent Labs    11/29/21 0537  LABPROT 13.3  INR 1.0     Imaging   CT Angio Chest PE W/Cm &/Or Wo Cm  Result Date:  11/28/2021 CLINICAL DATA:  41 year old female presents with shortness of breath and leg pain. EXAM: CT ANGIOGRAPHY CHEST WITH CONTRAST TECHNIQUE: Multidetector CT imaging of the chest was performed using the standard protocol during bolus administration of intravenous contrast. Multiplanar CT image reconstructions and MIPs were obtained to evaluate the vascular anatomy. RADIATION DOSE REDUCTION: This exam was performed according to the departmental dose-optimization program which includes automated exposure control, adjustment of the mA and/or kV according to patient size and/or use of iterative reconstruction technique. CONTRAST:  69mL OMNIPAQUE IOHEXOL 350 MG/ML SOLN COMPARISON:  None FINDINGS: Cardiovascular: Question of mild pericardial thickening. Heart size is enlarged. Moderately to markedly enlarged accounting for low lung volumes. Aortic caliber is normal. Central pulmonary vasculature is opacified to 314 Hounsfield units. Pulmonary vascular bed evaluated to the segmental level without evidence of pulmonary embolism Mediastinum/Nodes: Esophagus is grossly normal. There is no thoracic inlet lymphadenopathy. No axillary lymphadenopathy. No hilar lymphadenopathy. Nodular appearing anterior mediastinal soft tissue is noted along the anterior surface of the heart. This measures approximately 1.51.7 cm greatest thickness no discrete adenopathy along the RIGHT paratracheal chain or in the subcarinal region Lungs/Pleura: Small bilateral pleural effusions. Mosaic attenuation in the lung parenchyma towards the lung apices. Mild basilar atelectasis and signs of septal thickening. Upper Abdomen: Low attenuation in the anterior LEFT hemi liver likely a small cyst not well characterized on the current exam and measuring greater than 30 Hounsfield units (image 126/5) this measures 15 mm. Infiltration of the fat about the adrenal glands may be present though low-dose evaluation limits assessment of this area. No additional  findings of note in the upper abdomen. Musculoskeletal: No chest wall abnormality. No acute or significant osseous findings. Review of the MIP images confirms the above findings. IMPRESSION: 1. No evidence of pulmonary embolism to the segmental level. 2. Findings of congestive heart failure with edema, septal thickening and small bilateral pleural effusions. Cardiomegaly, potentially  representing cardiomyopathy in a patient of this age. 3. Nodular appearing anterior mediastinal soft tissue is noted along the anterior surface of the heart. This measures approximately 1.5-1.7 cm greatest thickness. 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 Consider correlation with thyroid function as Grave's disease as an association with both thymic hyperplasia and cardiac dysfunction. Would consider three-month follow-up with either CT or MRI to ensure resolution and or stability of anterior mediastinal soft tissue. Would also correlate with any known history of autoimmune disorder a could lead to cardiac dysfunction and thymic hyperplasia such as lupus. 4. Low attenuation along the anterior surface of the LEFT hemi liver is almost certainly a benign cyst though with attenuation values in the range of 30 Hounsfield units consider follow-up abdominal ultrasound four complete characterization. 5. Subtle stranding in the retroperitoneal fat of the upper abdomen may be related to generalized edema developing in the setting of cardiac dysfunction. Correlate with any abdominal symptoms with abdominal imaging as warranted for further assessment. Electronically Signed   By: Zetta Bills M.D.   On: 11/28/2021 21:47   DG Chest Port 1 View  Result Date: 11/28/2021 CLINICAL DATA:  Shortness of breath EXAM: PORTABLE CHEST 1 VIEW COMPARISON:  Chest x-ray dated August 04, 2021 FINDINGS: Increased enlargement of the cardiac contours compared with  prior exam, although evaluation is somewhat limited due to differences in technique. Mild bilateral interstitial opacities. No large pleural effusion or pneumothorax. IMPRESSION: 1. Increased enlargement of the cardiac contours when compared with prior exam. Recommend echocardiography for further evaluation. 2. Mild bilateral interstitial opacities, likely due to pulmonary edema. Electronically Signed   By: Yetta Glassman M.D.   On: 11/28/2021 17:29   ECHOCARDIOGRAM COMPLETE  Result Date: 11/29/2021    ECHOCARDIOGRAM REPORT   Patient Name:   Alice Hays Date of Exam: 11/29/2021 Medical Rec #:  [id number]          Height:       58.0 in Accession #:    [id number]         Weight:       181.0 lb Date of Birth:  [birth year]          BSA:          1.745 m Patient Age:    46 years           BP:           182/117 mmHg Patient Gender: F                  HR:           104 bpm. Exam Location:  Inpatient Procedure: 2D Echo, 3D Echo, Cardiac Doppler, Color Doppler and Strain Analysis Indications:    Cardiomegaly I51.7  History:        Patient has no prior history of Echocardiogram examinations.                 Acute Respiratory Distress, Hypoxemia, New Onset Heart Failure.                 Leg pain/swelling.  Sonographer:    Darlina Sicilian RDCS Referring Phys: [place]  1. Global longitudinal strain -11.8% Basal inferior/inferoseptal and lateral hypokinesis. . Left ventricular ejection fraction, by estimation, is 45%%. The left ventricle has mildly decreased function. The left ventricular internal cavity size was mildly dilated. There is mild  left ventricular hypertrophy. Left ventricular diastolic parameters are indeterminate.  2. Right ventricular systolic function is mildly reduced. The right ventricular size is normal. There is normal pulmonary artery systolic pressure.  3. Left atrial size was mildly dilated.  4. Moderate mitral valve regurgitation.  5. The aortic valve is normal in  structure. Aortic valve regurgitation is not visualized.  6. The inferior vena cava is normal in size with greater than 50% respiratory variability, suggesting right atrial pressure of 3 mmHg. FINDINGS  Left Ventricle: Global longitudinal strain -11.8% Basal inferior/inferoseptal and lateral hypokinesis. Left ventricular ejection fraction, by estimation, is 45%%. The left ventricle has mildly decreased function. The left ventricular internal cavity size was mildly dilated. There is mild left ventricular hypertrophy. Left ventricular diastolic parameters are indeterminate. Right Ventricle: The right ventricular size is normal. Right vetricular wall thickness was not assessed. Right ventricular systolic function is mildly reduced. There is normal pulmonary artery systolic pressure. The tricuspid regurgitant velocity is 2.26  m/s, and with an assumed right atrial pressure of 3 mmHg, the estimated right ventricular systolic pressure is 0000000 mmHg. Left Atrium: Left atrial size was mildly dilated. Right Atrium: Right atrial size was normal in size. Pericardium: There is no evidence of pericardial effusion. Mitral Valve: There is mild thickening of the mitral valve leaflet(s). Moderate mitral valve regurgitation. Tricuspid Valve: The tricuspid valve is normal in structure. Tricuspid valve regurgitation is mild. Aortic Valve: The aortic valve is normal in structure. Aortic valve regurgitation is not visualized. Pulmonic Valve: The pulmonic valve was normal in structure. Pulmonic valve regurgitation is trivial. Aorta: The aortic root and ascending aorta are structurally normal, with no evidence of dilitation. Venous: The inferior vena cava is normal in size with greater than 50% respiratory variability, suggesting right atrial pressure of 3 mmHg. IAS/Shunts: No atrial level shunt detected by color flow Doppler.  LEFT VENTRICLE PLAX 2D LVIDd:         5.50 cm LVIDs:         4.20 cm LV PW:         1.20 cm LV IVS:        1.20 cm  LVOT diam:     2.00 cm   3D Volume EF: LV SV:         41        3D EF:        40 % LV SV Index:   24        LV EDV:       216 ml LVOT Area:     3.14 cm  LV ESV:       129 ml                          LV SV:        87 ml RIGHT VENTRICLE RV S prime:     14.30 cm/s TAPSE (M-mode): 3.1 cm LEFT ATRIUM             Index        RIGHT ATRIUM           Index LA diam:        4.30 cm 2.46 cm/m   RA Area:     21.20 cm LA Vol (A2C):   77.8 ml 44.57 ml/m  RA Volume:   60.30 ml  34.55 ml/m LA Vol (A4C):   75.2 ml 43.08 ml/m LA Biplane Vol: 78.1 ml 44.74 ml/m  AORTIC  VALVE LVOT Vmax:   85.30 cm/s LVOT Vmean:  54.800 cm/s LVOT VTI:    0.132 m  AORTA Ao Root diam: 2.70 cm Ao Asc diam:  3.00 cm MITRAL VALVE                  TRICUSPID VALVE MV Area (PHT): 3.43 cm       TR Peak grad:   20.4 mmHg MV Decel Time: 221 msec       TR Vmax:        226.00 cm/s MR Peak grad:    147.9 mmHg MR Mean grad:    95.0 mmHg    SHUNTS MR Vmax:         608.00 cm/s  Systemic VTI:  0.13 m MR Vmean:        449.0 cm/s   Systemic Diam: 2.00 cm MR PISA:         1.01 cm MR PISA Eff ROA: 6 mm MR PISA Radius:  0.40 cm MV E velocity: 92.95 cm/s Dorris Carnes MD Electronically signed by Dorris Carnes MD Signature Date/Time: 11/29/2021/12:38:21 PM    Final    VAS [place] LOWER EXTREMITY VENOUS (DVT) (7a-7p)  Result Date: 11/28/2021  Lower Venous DVT Study Patient Name:  PATTIJO ENNES  Date of Exam:   11/28/2021 Medical Rec #: [id number]           Accession #:    [id number] Date of Birth: 07/27/1981           Patient Gender: F Patient Age:   7 years Exam Location:  [facility] Procedure:      VAS [place] LOWER EXTREMITY VENOUS (DVT) Referring Phys: JOSHUA GEIPLE --------------------------------------------------------------------------------  Indications: Edema, and SOB.  Comparison Study: No prior study Performing Technologist: Maudry Mayhew MHA, RDMS, RVT, RDCS  Examination Guidelines: A complete evaluation includes B-mode imaging, spectral Doppler, color  Doppler, and power Doppler as needed of all accessible portions of each vessel. Bilateral testing is considered an integral part of a complete examination. Limited examinations for reoccurring indications may be performed as noted. The reflux portion of the exam is performed with the patient in reverse Trendelenburg.  +-----+---------------+---------+-----------+----------+--------------+  RIGHT Compressibility Phasicity Spontaneity Properties Thrombus Aging  +-----+---------------+---------+-----------+----------+--------------+  CFV   Full            Yes       Yes                                    +-----+---------------+---------+-----------+----------+--------------+   +---------+---------------+---------+-----------+----------+--------------+  LEFT      Compressibility Phasicity Spontaneity Properties Thrombus Aging  +---------+---------------+---------+-----------+----------+--------------+  CFV       Full            Yes       Yes                                    +---------+---------------+---------+-----------+----------+--------------+  SFJ       Full                                                             +---------+---------------+---------+-----------+----------+--------------+  FV Prox  Full                                                             +---------+---------------+---------+-----------+----------+--------------+  FV Mid    Full                                                             +---------+---------------+---------+-----------+----------+--------------+  FV Distal Full                                                             +---------+---------------+---------+-----------+----------+--------------+  PFV       Full                                                             +---------+---------------+---------+-----------+----------+--------------+  POP       Full            Yes       Yes                                     +---------+---------------+---------+-----------+----------+--------------+  PTV       Full                                                             +---------+---------------+---------+-----------+----------+--------------+  PERO      Full                                                             +---------+---------------+---------+-----------+----------+--------------+     Summary: RIGHT: - No evidence of common femoral vein obstruction.  LEFT: - There is no evidence of deep vein thrombosis in the lower extremity.  - No cystic structure found in the popliteal fossa. -Pulsatile lower extremity venous flow is suggestive of possibly elevated right heart pressure.  *See table(s) above for measurements and observations.    Preliminary      Medications:     Current Medications:  enoxaparin (LOVENOX) injection  40 mg Subcutaneous Q24H   multivitamin with minerals  1 tablet Oral Daily    Infusions:     Patient Profile   41 y.o. female with history of HTN and multiple prior miscarriages. Admitted with acute respiratory failure with hypoxia and new systolic CHF.  Assessment/Plan  Acute systolic CHF: -Echo EF [id number], RV mildly reduced, normal PASP, moderate MR -Etiology not certain. Does note family hx CHF in first degree relative. ? Hypertension. BP poorly controlled on presentation and has been out of home medications. No ETOH or drug use. Family medicine working up for possible autoimmune disorder with presence of anterior mediastinal mass -HS minimally elevated with flat trend -R/LHC and cMRI once diuresed -TSH okay -BNP 1267 -NYHA IIIb. Volume up. Increase lasix to 60 mg IV BID.  -Start spiro 12.5 mg daily -Add losartan 25 mg daily -No beta blocker for now with acute CHF -TED hose -Daily BMET  2. Acute respiratory failure with hypoxia: -Likely d/t CHF -Off BiPAP. Now stable on RA. -Diurese  3. Hypertensive urgency: -BP markedly elevated on admit.  -Had been out of home  regimen for at least a couple of months. 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 IDA -Hgb 10.4 (chronically 10s) -Low iron stores -Give IV iron  6. Hx multiple miscarriages -Not aware of any prior diagnosis of antiphospholipid antibody syndrome   Length of Stay: 0  FINCH, LINDSAY N, PA-C  11/29/2021, 3:01 PM  Advanced Heart Failure Team Pager [phone] (M-F; 7a - 5p)  Please contact [facility] for night-coverage after hours (4p -7a ) and weekends on [email or web address]   Patient seen with PA, agree with the above note.   She has history of uncontrolled HTN for several years since her last pregnancy and has not been consistently treated.  On no medications at admission.  She also has a history of miscarriages and spontaneous abortions, never tested for antiphospholipid Ab syndrome that she knows of.  She reports several weeks of exertional dyspnea and painful leg swelling.  She was admitted with markedly elevated BP (SBP > 200), elevated BNP 1267. CTA chest showed no PE, anterior mediastinal mass, and pulmonary edema.  Echo was done and I reviewed, EF 40-45% range with moderate MR, moderate LAE, mildly decreased RV systolic function. She has been getting IV Lasix.   General: NAD Neck: JVP 10-12 cm, no thyromegaly or thyroid nodule.  Lungs: Clear to auscultation bilaterally with normal respiratory effort. CV: Nondisplaced PMI.  Heart regular S1/S2, +S4, no murmur.  1+ ankle edema.  No carotid bruit.  Normal pedal pulses.  Abdomen: Soft, nontender, no hepatosplenomegaly, no distention.  Skin: Intact without lesions or rashes.  Neurologic: Alert and oriented x 3.  Psych: Normal affect. Extremities: No clubbing or cyanosis.  HEENT: Normal.   1. Acute systolic CHF: Echo this admission with EF 40-45% range with mild LV dilation and mild LVH, moderate  MR, moderate LAE, mildly decreased RV systolic function.  She has uncontrolled HTN as a major risk factor.  HIV negative and TSH normal.  No ETOH or drug history with negative UDS. Spontaneous miscarriages concerning for antiphospholipid antibody syndrome, but when this affects the heart, it generally leads to valvular problems and not cardiomyopathy. She does have family history of CAD though not premature.  LDL is elevated. She is volume overloaded on exam.  - ANA, anti-dsDNA pending.  - Would give Lasix 60 mg IV bid.  - Think we can transition losartan to Entresto (has BP room).  - Start spironolactone 12.5 daily.  - Tomorrow after diuresis, will plan RHC/LHC to rule out coronary disease.  If unremarkable, will need cardiac MRI.  2.  HTN: Uncontrolled.  May be cause of cardiomyopathy.  Adjust medications as above.  3.  H/o multiple miscarriages/spontaneous abortions: ?Antiphospholipid antibody syndrome.  - Will send serologic workup.  4. Fe deficiency: Will give IV Fe.  5. 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  Would evaluate for signs of myasthenia gravis (per primary team).  Will need longitudinal followup of this, TCTS plans to followup as outpatient.   Loralie Champagne 11/29/2021 6:05 PM

## 2021-11-29 NOTE — TOC Progression Note (Signed)
Transition of Care [facility]) - Progression Note    Patient Details  Name: Alice Hays MRN: [id number] Date of Birth: 11-21-1980  [facility] CM/SW Contact  Zenon Mayo, RN Phone Number: 11/29/2021, 2:23 PM  Clinical Narrative:    From home , SOB , LE edema, she does have insurance, but she does not have a PCP, she is ok with seeing a MD at a [facility].  Secretary to make follow up apt.  Also she is indep, does not need any DME or HH.  She states if she has to have any DME she is ok with [employer] supplying this for her.  She will have transportation at dc.  TOC will continue to follow for dc needs.         Expected Discharge Plan and Services                                                 Social Determinants of Health (SDOH) Interventions    Readmission Risk Interventions No flowsheet data found.

## 2021-11-29 NOTE — ED Notes (Signed)
Up to the bathroom 

## 2021-11-29 NOTE — Hospital Course (Addendum)
Alice Hays is a 41 year old female who presented with acute shortness of breath and lower extremity swelling found to have new onset heart failure.  Past medical history significant for hypertension, IDA, multiple miscarriages.  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  Given age of onset and thymic hyperplasia that was worked up for possible autoimmune disorder/SLE/antiphospholipid syndrome. ANA, lupus anticoagulant panel, dsDNA, beta-2 microglobulin, cardiolipin antibodies all negative.  HIV and TSH were normal.  She denied any alcohol or drug use with a negative UDS.  Did have uncontrolled hypertension as a major risk factor. Advanced heart failure team was consulted and echocardiogram was performed which showed EF of 40 to 45%, mild LV dilation and mild LVH with moderate MR and moderate LAE and mildly decreased RV systolic function.  Right and left heart cath were performed and did not show significant coronary disease, nonischemic cardiomyopathy, low filling pressures.  Cardiac MRI was performed which showed normal LV size with mild LV hypertrophy, EF of 31% with diffuse hypokinesis, normal RV size with mild systolic dysfunction and EF of 38%, and no definite LGE noted.  Was diuresed net 4.4 L during hospitalization. Weight at end of hospitalization was 173.8 pounds.  Was discharged on Coreg 6.25 mg twice daily, Entresto 49/51 twice daily, spironolactone 25 daily, and Farxiga 10 mg daily with close follow-up in CHF clinic.  Hypertension, Poorly Controlled Blood pressure ranges initially to 200s over 140s.  Did not have a strict home regimen before.  Was controlled and discharged on carvedilol 6.25 mg twice daily, Entresto 49-51 twice  daily.  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 Possibly represents a small amount of thymic tissue and may represent thymic hyperplasia. Myasthenia gravis on differential however no bulbar weakness on examinations. SLE labs negative. CT surgery will follow up outpatient.  Multiple miscarriages Has had 2 ectopic pregnancies and 3 spontaneous abortions. Antiphospholipid labs were negative. Can also consider history of recurrent STI/PID, endometriosis, PCOS, fibroids, or other anatomic abnormalities that could be contributing.  Iron-deficiency Anemia  Ferritin low at 6, received IV ferriheme on 2/2. Hgb remained stable. Ferrous sulfate started in hospital.  Left Hemi-Liver Cyst  Incidental finding on CTA and likely a benign cyst.  Did not have any abdominal pain.

## 2021-11-29 NOTE — H&P (View-Only) (Signed)
Advanced Heart Failure Team Consult Note   Primary Physician: Pcp, No PCP-Cardiologist:  None  Reason for Consultation: New systolic CHF  HPI:    Alice Hays is seen today for evaluation of new systolic CHF at the request of Dr. Nori Riis with [facility] Medicine teaching service. 41 y.o. female with history of HTN and multiple miscarriages (2 ectopic pregnancies + 2 spontaneous abortions).   Presented to ED yesterday with complaints of worsening dyspnea X 1 month. She reports she has gained about 11 lb. Notes orthopnea, PND, LE edema and abdominal bloating. Has been unable to keep up with duties at work.  Has been out of her BP medications for at least several months, she is in process of establishing with a PCP. She was markedly hypertensive with SBP 213/140, HR 107, RR 24. BNP 1267, HS troponin 26.25. Chest xray with evidence of pulmonary edema. She appeared tachypneic and initially required BiPAP.Given IV hydralazine and IV lasix. CTA negative for PE. Incidentally noted pulmonary edema, small pleural effusions and nodular appearance anterior mediastinal soft tissue density (? Thymic hyperplasia).   She was admitted under Family Medicine teaching service for management of acute respiratory failure with hypoxia and new acute systolic CHF.  Echo today EF 45%, RV mildly reduced, moderate MR.  SLE serologies pending. CTS consulted regarding anterior mediastinal soft tissue density.  Received second dose of 40 mg lasix IV again today.1500 cc UOP charted so far. Dyspnea slightly improved. Still winded with conversation and minimal activity. Feels bloated. No CP, palpitations or presyncope.  Has family history of CHF in her father and CAD in maternal grandfather.  Works at [employer]. Rare alcohol consumption. No tobacco or drug use.  Review of Systems: [y] = yes, [ ]  = no   General: Weight gain [Y]; Weight loss [ ] ; Anorexia [ ] ; Fatigue [Y]; Fever [ ] ; Chills [ ] ; Weakness [ ]   Cardiac: Chest  pain/pressure [ ] ; Resting SOB [ ] ; Exertional SOB [Y]; Orthopnea [Y]; Pedal Edema [Y]; Palpitations [ ] ; Syncope [ ] ; Presyncope [ ] ; Paroxysmal nocturnal dyspnea[Y ]  Pulmonary: Cough [ ] ; Wheezing[ ] ; Hemoptysis[ ] ; Sputum [ ] ; Snoring [ ]   GI: Vomiting[ ] ; Dysphagia[ ] ; Melena[ ] ; Hematochezia [ ] ; Heartburn[ ] ; Abdominal pain [ ] ; Constipation [ ] ; Diarrhea [ ] ; BRBPR [ ]   GU: Hematuria[ ] ; Dysuria [ ] ; Nocturia[ ]   Vascular: Pain in legs with walking [ ] ; Pain in feet with lying flat [ ] ; Non-healing sores [ ] ; Stroke [ ] ; TIA [ ] ; Slurred speech [ ] ;  Neuro: Headaches[ ] ; Vertigo[ ] ; Seizures[ ] ; Paresthesias[ ] ;Blurred vision [ ] ; Diplopia [ ] ; Vision changes [ ]   Ortho/Skin: Arthritis [ ] ; Joint pain [ ] ; Muscle pain [ ] ; Joint swelling [ ] ; Back Pain [ ] ; Rash [ ]   Psych: Depression[ ] ; Anxiety[ ]   Heme: Bleeding problems [ ] ; Clotting disorders [ ] ; Anemia [ ]   Endocrine: Diabetes [ ] ; Thyroid dysfunction[ ]   Home Medications Prior to Admission medications   Medication Sig Start Date End Date Taking? Authorizing Provider  Multiple Vitamin (MV-ONE PO) Take 1 capsule by mouth every morning.   Yes [provider]  PRESCRIPTION MEDICATION Take 1 tablet by mouth daily. Red tablet for blood pressure   Yes [provider]  cyclobenzaprine (FLEXERIL) 5 MG tablet Take 1 tablet (5 mg total) by mouth 3 (three) times daily as needed for muscle spasms. Patient not taking: Reported on 11/28/2021 10/04/21   Jonah Blue,  Dionne Bucy, NP  hydrochlorothiazide (HYDRODIURIL) 25 MG tablet Take 25 mg by mouth daily. Patient not taking: Reported on 11/29/2021    [provider]  medroxyPROGESTERone (DEPO-PROVERA) 150 MG/ML injection Inject 1 mL (150 mg total) into the muscle every 13 weeks Patient not taking: Reported on 11/28/2021 02/06/21     naproxen (NAPROSYN) 375 MG tablet Take 1 tablet (375 mg total) by mouth 2 (two) times daily as needed. Patient not taking: Reported on 11/28/2021 10/04/21    Carvel Getting, NP    Past Medical History: Past Medical History:  Diagnosis Date   Anemia    Ectopic pregnancy    L adnexa   Hx of varicella    Hypertension     Past Surgical History: Past Surgical History:  Procedure Laterality Date   ARM SURGERY     CESAREAN SECTION N/A 06/29/2016   Procedure: CESAREAN SECTION;  Surgeon: Servando Salina, MD;  Location: [facility];  Service: Obstetrics;  Laterality: N/A;    Family History: Family History  Problem Relation Age of Onset   Cancer Mother    Heart disease Mother    Heart disease Father    Hypertension Father    Stroke Father    Cancer Maternal Aunt    Diabetes Maternal Grandmother     Social History: Social History   Socioeconomic History   Marital status: Married    Spouse name: Not on file   Number of children: Not on file   Years of education: Not on file   Highest education level: Not on file  Occupational History   Not on file  Tobacco Use   Smoking status: Never   Smokeless tobacco: Never  Vaping Use   Vaping Use: Never used  Substance and Sexual Activity   Alcohol use: No   Drug use: No   Sexual activity: Yes    Birth control/protection: None  Other Topics Concern   Not on file  Social History Narrative   Not on file   Social Determinants of Health   Financial Resource Strain: Not on file  Food Insecurity: Not on file  Transportation Needs: Not on file  Physical Activity: Not on file  Stress: Not on file  Social Connections: Not on file    Allergies:  Allergies  Allergen Reactions   Shellfish Allergy Anaphylaxis    Objective:    Vital Signs:   Temp:  [98.3 F (36.8 C)-98.6 F (37 C)] 98.6 F (37 C) (02/02 1339) Pulse Rate:  [84-107] 99 (02/02 1339) Resp:  [18-35] 18 (02/02 1339) BP: (124-213)/(73-144) 159/93 (02/02 1339) SpO2:  [96 %-100 %] 100 % (02/02 1339) Weight:  [77.7 kg] 77.7 kg (02/02 1339)    Weight change: Filed Weights   11/29/21 1339  Weight: 77.7 kg     Intake/Output:   Intake/Output Summary (Last 24 hours) at 11/29/2021 1501 Last data filed at 11/29/2021 1426 Gross per 24 hour  Intake 120 ml  Output 1500 ml  Net -1380 ml      Physical Exam    General:  Sitting up in bed. Appears dyspneic with conversation HEENT: normal Neck: sup Cor: PMI nondisplaced. Regular rate & rhythm. No rubs, gallops or murmurs. Lungs: bibasilar crackles Abdomen: soft, nontender, nondistended. No hepatosplenomegaly.  Extremities: no cyanosis, clubbing, rash, 1+ edema Neuro: alert & orientedx3, cranial nerves grossly intact. moves all 4 extremities w/o difficulty. Affect pleasant   Telemetry   SR 80s-90s  EKG    Sinus tachycardia 109 bpm  Labs   Basic Metabolic Panel: Recent Labs  Lab 11/28/21 1617 11/28/21 1732 11/29/21 0537  NA 136 135 137  K 4.0 3.4* 4.5  CL 103  --  107  CO2 25  --  23  GLUCOSE 109*  --  103*  BUN 13  --  10  CREATININE 0.84  --  0.80  CALCIUM 8.5*  --  8.3*    Liver Function Tests: Recent Labs  Lab 11/28/21 1617 11/29/21 0537  AST 37 45*  ALT 50* 47*  ALKPHOS 66 57  BILITOT 0.5 1.1  PROT 5.8* 6.0*  ALBUMIN 3.1* 3.1*   No results for input(s): LIPASE, AMYLASE in the last 168 hours. No results for input(s): AMMONIA in the last 168 hours.  CBC: Recent Labs  Lab 11/28/21 1617 11/28/21 1732 11/29/21 0537  WBC 8.5  --  8.6  HGB 10.0* 10.9* 10.4*  HCT 35.5* 32.0* 35.2*  MCV 77.0*  --  75.1*  PLT 333  --  340    Cardiac Enzymes: No results for input(s): CKTOTAL, CKMB, CKMBINDEX, TROPONINI in the last 168 hours.  BNP: BNP (last 3 results) Recent Labs    11/28/21 1748  BNP 1,267.7*    ProBNP (last 3 results) No results for input(s): PROBNP in the last 8760 hours.   CBG: No results for input(s): GLUCAP in the last 168 hours.  Coagulation Studies: Recent Labs    11/29/21 0537  LABPROT 13.3  INR 1.0     Imaging   CT Angio Chest PE W/Cm &/Or Wo Cm  Result Date:  11/28/2021 CLINICAL DATA:  41 year old female presents with shortness of breath and leg pain. EXAM: CT ANGIOGRAPHY CHEST WITH CONTRAST TECHNIQUE: Multidetector CT imaging of the chest was performed using the standard protocol during bolus administration of intravenous contrast. Multiplanar CT image reconstructions and MIPs were obtained to evaluate the vascular anatomy. RADIATION DOSE REDUCTION: This exam was performed according to the departmental dose-optimization program which includes automated exposure control, adjustment of the mA and/or kV according to patient size and/or use of iterative reconstruction technique. CONTRAST:  20mL OMNIPAQUE IOHEXOL 350 MG/ML SOLN COMPARISON:  None FINDINGS: Cardiovascular: Question of mild pericardial thickening. Heart size is enlarged. Moderately to markedly enlarged accounting for low lung volumes. Aortic caliber is normal. Central pulmonary vasculature is opacified to 314 Hounsfield units. Pulmonary vascular bed evaluated to the segmental level without evidence of pulmonary embolism Mediastinum/Nodes: Esophagus is grossly normal. There is no thoracic inlet lymphadenopathy. No axillary lymphadenopathy. No hilar lymphadenopathy. Nodular appearing anterior mediastinal soft tissue is noted along the anterior surface of the heart. This measures approximately 1.51.7 cm greatest thickness no discrete adenopathy along the RIGHT paratracheal chain or in the subcarinal region Lungs/Pleura: Small bilateral pleural effusions. Mosaic attenuation in the lung parenchyma towards the lung apices. Mild basilar atelectasis and signs of septal thickening. Upper Abdomen: Low attenuation in the anterior LEFT hemi liver likely a small cyst not well characterized on the current exam and measuring greater than 30 Hounsfield units (image 126/5) this measures 15 mm. Infiltration of the fat about the adrenal glands may be present though low-dose evaluation limits assessment of this area. No additional  findings of note in the upper abdomen. Musculoskeletal: No chest wall abnormality. No acute or significant osseous findings. Review of the MIP images confirms the above findings. IMPRESSION: 1. No evidence of pulmonary embolism to the segmental level. 2. Findings of congestive heart failure with edema, septal thickening and small bilateral pleural effusions. Cardiomegaly, potentially  representing cardiomyopathy in a patient of this age. 3. Nodular appearing anterior mediastinal soft tissue is noted along the anterior surface of the heart. This measures approximately 1.5-1.7 cm greatest thickness. 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 Consider correlation with thyroid function as Grave's disease as an association with both thymic hyperplasia and cardiac dysfunction. Would consider three-month follow-up with either CT or MRI to ensure resolution and or stability of anterior mediastinal soft tissue. Would also correlate with any known history of autoimmune disorder a could lead to cardiac dysfunction and thymic hyperplasia such as lupus. 4. Low attenuation along the anterior surface of the LEFT hemi liver is almost certainly a benign cyst though with attenuation values in the range of 30 Hounsfield units consider follow-up abdominal ultrasound four complete characterization. 5. Subtle stranding in the retroperitoneal fat of the upper abdomen may be related to generalized edema developing in the setting of cardiac dysfunction. Correlate with any abdominal symptoms with abdominal imaging as warranted for further assessment. Electronically Signed   By: Zetta Bills M.D.   On: 11/28/2021 21:47   DG Chest Port 1 View  Result Date: 11/28/2021 CLINICAL DATA:  Shortness of breath EXAM: PORTABLE CHEST 1 VIEW COMPARISON:  Chest x-ray dated August 04, 2021 FINDINGS: Increased enlargement of the cardiac contours compared with  prior exam, although evaluation is somewhat limited due to differences in technique. Mild bilateral interstitial opacities. No large pleural effusion or pneumothorax. IMPRESSION: 1. Increased enlargement of the cardiac contours when compared with prior exam. Recommend echocardiography for further evaluation. 2. Mild bilateral interstitial opacities, likely due to pulmonary edema. Electronically Signed   By: Yetta Glassman M.D.   On: 11/28/2021 17:29   ECHOCARDIOGRAM COMPLETE  Result Date: 11/29/2021    ECHOCARDIOGRAM REPORT   Patient Name:   VERDEAN BRANDO Date of Exam: 11/29/2021 Medical Rec #:  [id number]          Height:       58.0 in Accession #:    [id number]         Weight:       181.0 lb Date of Birth:  [birth year]          BSA:          1.745 m Patient Age:    46 years           BP:           182/117 mmHg Patient Gender: F                  HR:           104 bpm. Exam Location:  Inpatient Procedure: 2D Echo, 3D Echo, Cardiac Doppler, Color Doppler and Strain Analysis Indications:    Cardiomegaly I51.7  History:        Patient has no prior history of Echocardiogram examinations.                 Acute Respiratory Distress, Hypoxemia, New Onset Heart Failure.                 Leg pain/swelling.  Sonographer:    Darlina Sicilian RDCS Referring Phys: [place]  1. Global longitudinal strain -11.8% Basal inferior/inferoseptal and lateral hypokinesis. . Left ventricular ejection fraction, by estimation, is 45%%. The left ventricle has mildly decreased function. The left ventricular internal cavity size was mildly dilated. There is mild  left ventricular hypertrophy. Left ventricular diastolic parameters are indeterminate.  2. Right ventricular systolic function is mildly reduced. The right ventricular size is normal. There is normal pulmonary artery systolic pressure.  3. Left atrial size was mildly dilated.  4. Moderate mitral valve regurgitation.  5. The aortic valve is normal in  structure. Aortic valve regurgitation is not visualized.  6. The inferior vena cava is normal in size with greater than 50% respiratory variability, suggesting right atrial pressure of 3 mmHg. FINDINGS  Left Ventricle: Global longitudinal strain -11.8% Basal inferior/inferoseptal and lateral hypokinesis. Left ventricular ejection fraction, by estimation, is 45%%. The left ventricle has mildly decreased function. The left ventricular internal cavity size was mildly dilated. There is mild left ventricular hypertrophy. Left ventricular diastolic parameters are indeterminate. Right Ventricle: The right ventricular size is normal. Right vetricular wall thickness was not assessed. Right ventricular systolic function is mildly reduced. There is normal pulmonary artery systolic pressure. The tricuspid regurgitant velocity is 2.26  m/s, and with an assumed right atrial pressure of 3 mmHg, the estimated right ventricular systolic pressure is 0000000 mmHg. Left Atrium: Left atrial size was mildly dilated. Right Atrium: Right atrial size was normal in size. Pericardium: There is no evidence of pericardial effusion. Mitral Valve: There is mild thickening of the mitral valve leaflet(s). Moderate mitral valve regurgitation. Tricuspid Valve: The tricuspid valve is normal in structure. Tricuspid valve regurgitation is mild. Aortic Valve: The aortic valve is normal in structure. Aortic valve regurgitation is not visualized. Pulmonic Valve: The pulmonic valve was normal in structure. Pulmonic valve regurgitation is trivial. Aorta: The aortic root and ascending aorta are structurally normal, with no evidence of dilitation. Venous: The inferior vena cava is normal in size with greater than 50% respiratory variability, suggesting right atrial pressure of 3 mmHg. IAS/Shunts: No atrial level shunt detected by color flow Doppler.  LEFT VENTRICLE PLAX 2D LVIDd:         5.50 cm LVIDs:         4.20 cm LV PW:         1.20 cm LV IVS:        1.20 cm  LVOT diam:     2.00 cm   3D Volume EF: LV SV:         41        3D EF:        40 % LV SV Index:   24        LV EDV:       216 ml LVOT Area:     3.14 cm  LV ESV:       129 ml                          LV SV:        87 ml RIGHT VENTRICLE RV S prime:     14.30 cm/s TAPSE (M-mode): 3.1 cm LEFT ATRIUM             Index        RIGHT ATRIUM           Index LA diam:        4.30 cm 2.46 cm/m   RA Area:     21.20 cm LA Vol (A2C):   77.8 ml 44.57 ml/m  RA Volume:   60.30 ml  34.55 ml/m LA Vol (A4C):   75.2 ml 43.08 ml/m LA Biplane Vol: 78.1 ml 44.74 ml/m  AORTIC  VALVE LVOT Vmax:   85.30 cm/s LVOT Vmean:  54.800 cm/s LVOT VTI:    0.132 m  AORTA Ao Root diam: 2.70 cm Ao Asc diam:  3.00 cm MITRAL VALVE                  TRICUSPID VALVE MV Area (PHT): 3.43 cm       TR Peak grad:   20.4 mmHg MV Decel Time: 221 msec       TR Vmax:        226.00 cm/s MR Peak grad:    147.9 mmHg MR Mean grad:    95.0 mmHg    SHUNTS MR Vmax:         608.00 cm/s  Systemic VTI:  0.13 m MR Vmean:        449.0 cm/s   Systemic Diam: 2.00 cm MR PISA:         1.01 cm MR PISA Eff ROA: 6 mm MR PISA Radius:  0.40 cm MV E velocity: 92.95 cm/s Dorris Carnes MD Electronically signed by Dorris Carnes MD Signature Date/Time: 11/29/2021/12:38:21 PM    Final    VAS [place] LOWER EXTREMITY VENOUS (DVT) (7a-7p)  Result Date: 11/28/2021  Lower Venous DVT Study Patient Name:  LINKYN DENIKE  Date of Exam:   11/28/2021 Medical Rec #: [id number]           Accession #:    [id number] Date of Birth: 1980-11-12           Patient Gender: F Patient Age:   14 years Exam Location:  [facility] Procedure:      VAS [place] LOWER EXTREMITY VENOUS (DVT) Referring Phys: JOSHUA GEIPLE --------------------------------------------------------------------------------  Indications: Edema, and SOB.  Comparison Study: No prior study Performing Technologist: Maudry Mayhew MHA, RDMS, RVT, RDCS  Examination Guidelines: A complete evaluation includes B-mode imaging, spectral Doppler, color  Doppler, and power Doppler as needed of all accessible portions of each vessel. Bilateral testing is considered an integral part of a complete examination. Limited examinations for reoccurring indications may be performed as noted. The reflux portion of the exam is performed with the patient in reverse Trendelenburg.  +-----+---------------+---------+-----------+----------+--------------+  RIGHT Compressibility Phasicity Spontaneity Properties Thrombus Aging  +-----+---------------+---------+-----------+----------+--------------+  CFV   Full            Yes       Yes                                    +-----+---------------+---------+-----------+----------+--------------+   +---------+---------------+---------+-----------+----------+--------------+  LEFT      Compressibility Phasicity Spontaneity Properties Thrombus Aging  +---------+---------------+---------+-----------+----------+--------------+  CFV       Full            Yes       Yes                                    +---------+---------------+---------+-----------+----------+--------------+  SFJ       Full                                                             +---------+---------------+---------+-----------+----------+--------------+  FV Prox  Full                                                             +---------+---------------+---------+-----------+----------+--------------+  FV Mid    Full                                                             +---------+---------------+---------+-----------+----------+--------------+  FV Distal Full                                                             +---------+---------------+---------+-----------+----------+--------------+  PFV       Full                                                             +---------+---------------+---------+-----------+----------+--------------+  POP       Full            Yes       Yes                                     +---------+---------------+---------+-----------+----------+--------------+  PTV       Full                                                             +---------+---------------+---------+-----------+----------+--------------+  PERO      Full                                                             +---------+---------------+---------+-----------+----------+--------------+     Summary: RIGHT: - No evidence of common femoral vein obstruction.  LEFT: - There is no evidence of deep vein thrombosis in the lower extremity.  - No cystic structure found in the popliteal fossa. -Pulsatile lower extremity venous flow is suggestive of possibly elevated right heart pressure.  *See table(s) above for measurements and observations.    Preliminary      Medications:     Current Medications:  enoxaparin (LOVENOX) injection  40 mg Subcutaneous Q24H   multivitamin with minerals  1 tablet Oral Daily    Infusions:     Patient Profile   41 y.o. female with history of HTN and multiple prior miscarriages. Admitted with acute respiratory failure with hypoxia and new systolic CHF.  Assessment/Plan  Acute systolic CHF: -Echo EF [id number], RV mildly reduced, normal PASP, moderate MR -Etiology not certain. Does note family hx CHF in first degree relative. ? Hypertension. BP poorly controlled on presentation and has been out of home medications. No ETOH or drug use. Family medicine working up for possible autoimmune disorder with presence of anterior mediastinal mass -HS minimally elevated with flat trend -R/LHC and cMRI once diuresed -TSH okay -BNP 1267 -NYHA IIIb. Volume up. Increase lasix to 60 mg IV BID.  -Start spiro 12.5 mg daily -Add losartan 25 mg daily -No beta blocker for now with acute CHF -TED hose -Daily BMET  2. Acute respiratory failure with hypoxia: -Likely d/t CHF -Off BiPAP. Now stable on RA. -Diurese  3. Hypertensive urgency: -BP markedly elevated on admit.  -Had been out of home  regimen for at least a couple of months. 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 IDA -Hgb 10.4 (chronically 10s) -Low iron stores -Give IV iron  6. Hx multiple miscarriages -Not aware of any prior diagnosis of antiphospholipid antibody syndrome   Length of Stay: 0  FINCH, LINDSAY N, PA-C  11/29/2021, 3:01 PM  Advanced Heart Failure Team Pager [phone] (M-F; 7a - 5p)  Please contact [facility] for night-coverage after hours (4p -7a ) and weekends on [email or web address]   Patient seen with PA, agree with the above note.   She has history of uncontrolled HTN for several years since her last pregnancy and has not been consistently treated.  On no medications at admission.  She also has a history of miscarriages and spontaneous abortions, never tested for antiphospholipid Ab syndrome that she knows of.  She reports several weeks of exertional dyspnea and painful leg swelling.  She was admitted with markedly elevated BP (SBP > 200), elevated BNP 1267. CTA chest showed no PE, anterior mediastinal mass, and pulmonary edema.  Echo was done and I reviewed, EF 40-45% range with moderate MR, moderate LAE, mildly decreased RV systolic function. She has been getting IV Lasix.   General: NAD Neck: JVP 10-12 cm, no thyromegaly or thyroid nodule.  Lungs: Clear to auscultation bilaterally with normal respiratory effort. CV: Nondisplaced PMI.  Heart regular S1/S2, +S4, no murmur.  1+ ankle edema.  No carotid bruit.  Normal pedal pulses.  Abdomen: Soft, nontender, no hepatosplenomegaly, no distention.  Skin: Intact without lesions or rashes.  Neurologic: Alert and oriented x 3.  Psych: Normal affect. Extremities: No clubbing or cyanosis.  HEENT: Normal.   1. Acute systolic CHF: Echo this admission with EF 40-45% range with mild LV dilation and mild LVH, moderate  MR, moderate LAE, mildly decreased RV systolic function.  She has uncontrolled HTN as a major risk factor.  HIV negative and TSH normal.  No ETOH or drug history with negative UDS. Spontaneous miscarriages concerning for antiphospholipid antibody syndrome, but when this affects the heart, it generally leads to valvular problems and not cardiomyopathy. She does have family history of CAD though not premature.  LDL is elevated. She is volume overloaded on exam.  - ANA, anti-dsDNA pending.  - Would give Lasix 60 mg IV bid.  - Think we can transition losartan to Entresto (has BP room).  - Start spironolactone 12.5 daily.  - Tomorrow after diuresis, will plan RHC/LHC to rule out coronary disease.  If unremarkable, will need cardiac MRI.  2.  HTN: Uncontrolled.  May be cause of cardiomyopathy.  Adjust medications as above.  3.  H/o multiple miscarriages/spontaneous abortions: ?Antiphospholipid antibody syndrome.  - Will send serologic workup.  4. Fe deficiency: Will give IV Fe.  5. 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  Would evaluate for signs of myasthenia gravis (per primary team).  Will need longitudinal followup of this, TCTS plans to followup as outpatient.   Loralie Champagne 11/29/2021 6:05 PM

## 2021-11-29 NOTE — Progress Notes (Addendum)
Spoke with Rosann Auerbach, cardiology master - placed cardiology consult  Spoke with Alycia Rossetti with TCTS regarding consult, provider to see today --  Received secure chat from [facility]. Chart and images reviewed by Dr. Cliffton Asters, plan for outpatient follow-up - TCTS will set up appointment.

## 2021-11-29 NOTE — Progress Notes (Signed)
Family Medicine Teaching Service Daily Progress Note Intern Pager: [phone]  Patient name: Alice Hays Medical record number: [id number] Date of birth: [birth year] Age: 41 y.o. Gender: female  Primary Care Provider: Pcp, No Consultants: None Code Status: Full  Pt Overview and Major Events to Date:  2/2 - Admitted, cardiology consulted for HF  Assessment and Plan: Alice Hays is a 41 yo female presenting with SOB and swelling. PMHx significant HTN, anemia, multiple miscarriages.  Acute systolic heart failure   BLE edema Patient currently stable on room air. Echo showed Basal inferior/inferoseptal and lateral hypokinesis; EF45% with LV mild dilation and LVH; LA mildly dilated, moderate MV regurgitation.  - Cardiology consulted, appreciate recs - Started Entresto 24-26mg   - Started Spironolactone 12.5mg  - TED hose - Daily MP - Plan for [facility] and cMRI today  HTN, poorly controlled BP most recently 130s-140s/80s-90s. Avoiding beta blockers due to acute CHF. - Started Spironolactone 12.5mg   - Started Entresto 24-26mg   Hypokalemia Potassium 3.2  - Replete today - AM BMP  Anterior mediastinal mass Noted on CTA. Evaluating for thyroid disease and SLE with labs pending.  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 Consideration for anti-phospholipid syndrome or other hypercoagulable syndromes, labs pending for work-up - Pending labs: Beta 2 microglobulin, cardiolipin antibodies, Lupus anticoagulant, ANA, Anti-DNA, C3 complement  IDA Ferritin level 6. Patient received ferraheme 2/2. - Follow AM CBC  Left hemi-liver cyst   Transaminitis Cyst likely benign. If clinical concerns, will obtain abdominal US.  - Continue to monitor clinically   FEN/GI: Heart healthy PPx: Lovenox Dispo:Home pending clinical improvement . Barriers include continued medical work-up.   Subjective:  Patient has no complaints today and feels that her breathing and  her breathing is improved. She is aware of the plan today and has no questions.   Objective: Temp:  [98.3 F (36.8 C)-98.7 F (37.1 C)] 98.5 F (36.9 C) (02/03 0354) Pulse Rate:  [83-101] 83 (02/03 0354) Resp:  [17-34] 20 (02/03 0354) BP: (102-182)/(74-117) 131/89 (02/03 0354) SpO2:  [94 %-100 %] 95 % (02/03 0354) Weight:  [76.3 kg-77.7 kg] 76.3 kg (02/03 0354) Physical Exam: General: Alert and oriented, NAD, on room air Cardiovascular: RRR, no murmur appreciated Respiratory: Minimal bibasilar crackles, no increased work of breathing. Abdomen: Bowel sounds present, no tenderness to palpation Extremities: Moving all equally and appropriately, trace pitting edema  Laboratory: Recent Labs  Lab 11/28/21 1617 11/28/21 1732 11/29/21 0537 11/30/21 0146  WBC 8.5  --  8.6 7.8  HGB 10.0* 10.9* 10.4* 10.5*  HCT 35.5* 32.0* 35.2* 36.0  PLT 333  --  340 335   Recent Labs  Lab 11/28/21 1617 11/28/21 1732 11/29/21 0537 11/30/21 0146  NA 136 135 137 137  K 4.0 3.4* 4.5 3.2*  CL 103  --  107 101  CO2 25  --  23 25  BUN 13  --  10 12  CREATININE 0.84  --  0.80 0.94  CALCIUM 8.5*  --  8.3* 8.8*  PROT 5.8*  --  6.0*  --   BILITOT 0.5  --  1.1  --   ALKPHOS 66  --  57  --   ALT 50*  --  47*  --   AST 37  --  45*  --   GLUCOSE 109*  --  103* 107*      Imaging/Diagnostic Tests: ECHOCARDIOGRAM COMPLETE  Result Date: 11/29/2021    ECHOCARDIOGRAM REPORT   Patient Name:   Alice  L Hays Date of Exam: 11/29/2021 Medical Rec #:  [id number]          Height:       58.0 in Accession #:    [id number]         Weight:       181.0 lb Date of Birth:  May 22, 1981          BSA:          1.745 m Patient Age:    40 years           BP:           182/117 mmHg Patient Gender: F                  HR:           104 bpm. Exam Location:  Inpatient Procedure: 2D Echo, 3D Echo, Cardiac Doppler, Color Doppler and Strain Analysis Indications:    Cardiomegaly I51.7  History:        Patient has no prior history of  Echocardiogram examinations.                 Acute Respiratory Distress, Hypoxemia, New Onset Heart Failure.                 Leg pain/swelling.  Sonographer:    Leta Jungling RDCS Referring Phys: [phone] NATHAN PICKERING IMPRESSIONS  1. Global longitudinal strain -11.8% Basal inferior/inferoseptal and lateral hypokinesis. . Left ventricular ejection fraction, by estimation, is 45%%. The left ventricle has mildly decreased function. The left ventricular internal cavity size was mildly dilated. There is mild left ventricular hypertrophy. Left ventricular diastolic parameters are indeterminate.  2. Right ventricular systolic function is mildly reduced. The right ventricular size is normal. There is normal pulmonary artery systolic pressure.  3. Left atrial size was mildly dilated.  4. Moderate mitral valve regurgitation.  5. The aortic valve is normal in structure. Aortic valve regurgitation is not visualized.  6. The inferior vena cava is normal in size with greater than 50% respiratory variability, suggesting right atrial pressure of 3 mmHg. FINDINGS  Left Ventricle: Global longitudinal strain -11.8% Basal inferior/inferoseptal and lateral hypokinesis. Left ventricular ejection fraction, by estimation, is 45%%. The left ventricle has mildly decreased function. The left ventricular internal cavity size was mildly dilated. There is mild left ventricular hypertrophy. Left ventricular diastolic parameters are indeterminate. Right Ventricle: The right ventricular size is normal. Right vetricular wall thickness was not assessed. Right ventricular systolic function is mildly reduced. There is normal pulmonary artery systolic pressure. The tricuspid regurgitant velocity is 2.26  m/s, and with an assumed right atrial pressure of 3 mmHg, the estimated right ventricular systolic pressure is 23.4 mmHg. Left Atrium: Left atrial size was mildly dilated. Right Atrium: Right atrial size was normal in size. Pericardium: There is no  evidence of pericardial effusion. Mitral Valve: There is mild thickening of the mitral valve leaflet(s). Moderate mitral valve regurgitation. Tricuspid Valve: The tricuspid valve is normal in structure. Tricuspid valve regurgitation is mild. Aortic Valve: The aortic valve is normal in structure. Aortic valve regurgitation is not visualized. Pulmonic Valve: The pulmonic valve was normal in structure. Pulmonic valve regurgitation is trivial. Aorta: The aortic root and ascending aorta are structurally normal, with no evidence of dilitation. Venous: The inferior vena cava is normal in size with greater than 50% respiratory variability, suggesting right atrial pressure of 3 mmHg. IAS/Shunts: No atrial level shunt detected by color flow Doppler.  LEFT VENTRICLE PLAX 2D  LVIDd:         5.50 cm LVIDs:         4.20 cm LV PW:         1.20 cm LV IVS:        1.20 cm LVOT diam:     2.00 cm   3D Volume EF: LV SV:         41        3D EF:        40 % LV SV Index:   24        LV EDV:       216 ml LVOT Area:     3.14 cm  LV ESV:       129 ml                          LV SV:        87 ml RIGHT VENTRICLE RV S prime:     14.30 cm/s TAPSE (M-mode): 3.1 cm LEFT ATRIUM             Index        RIGHT ATRIUM           Index LA diam:        4.30 cm 2.46 cm/m   RA Area:     21.20 cm LA Vol (A2C):   77.8 ml 44.57 ml/m  RA Volume:   60.30 ml  34.55 ml/m LA Vol (A4C):   75.2 ml 43.08 ml/m LA Biplane Vol: 78.1 ml 44.74 ml/m  AORTIC VALVE LVOT Vmax:   85.30 cm/s LVOT Vmean:  54.800 cm/s LVOT VTI:    0.132 m  AORTA Ao Root diam: 2.70 cm Ao Asc diam:  3.00 cm MITRAL VALVE                  TRICUSPID VALVE MV Area (PHT): 3.43 cm       TR Peak grad:   20.4 mmHg MV Decel Time: 221 msec       TR Vmax:        226.00 cm/s MR Peak grad:    147.9 mmHg MR Mean grad:    95.0 mmHg    SHUNTS MR Vmax:         608.00 cm/s  Systemic VTI:  0.13 m MR Vmean:        449.0 cm/s   Systemic Diam: 2.00 cm MR PISA:         1.01 cm MR PISA Eff ROA: 6 mm MR PISA  Radius:  0.40 cm MV E velocity: 92.95 cm/s Dietrich Pates MD Electronically signed by Dietrich Pates MD Signature Date/Time: 11/29/2021/12:38:21 PM    Final      Evelena Leyden, DO 11/30/2021, 7:02 AM PGY-2, [place] [facility] FPTS Intern pager: [phone], text pages welcome

## 2021-11-29 NOTE — TOC Benefit Eligibility Note (Signed)
Patient Product/process development scientist completed.    The patient is currently admitted and upon discharge could be taking Farxiga 10 mg.  Requires Prior Authorization  The patient is currently admitted and upon discharge could be taking Jardiance 10 mg.  Requires Prior Authorization  The patient is currently admitted and upon discharge could be taking Entresto 24-26 mg.  The current 30 day co-pay is, $125.00.   The patient is insured through [employer]     Roland Earl, CPhT Pharmacy Patient Advocate Specialist [facility] Patient Advocate Team Direct Number: [phone]  Fax: [phone]

## 2021-11-30 ENCOUNTER — Encounter (HOSPITAL_COMMUNITY)
Admission: EM | Disposition: A | Discharge status: Home / Self Care | Payer: Self-pay | Source: Home / Self Care | Attending: Family Medicine

## 2021-11-30 ENCOUNTER — Other Ambulatory Visit (HOSPITAL_COMMUNITY): Payer: Self-pay

## 2021-11-30 DIAGNOSIS — I509 Heart failure, unspecified: Secondary | ICD-10-CM | POA: Diagnosis not present

## 2021-11-30 DIAGNOSIS — I5021 Acute systolic (congestive) heart failure: Secondary | ICD-10-CM

## 2021-11-30 HISTORY — PX: RIGHT/LEFT HEART CATH AND CORONARY ANGIOGRAPHY: CATH118266

## 2021-11-30 LAB — POCT I-STAT EG7
Acid-Base Excess: 3 mmol/L — ABNORMAL HIGH (ref 0.0–2.0)
Acid-Base Excess: 4 mmol/L — ABNORMAL HIGH (ref 0.0–2.0)
Bicarbonate: 26.3 mmol/L (ref 20.0–28.0)
Bicarbonate: 27.5 mmol/L (ref 20.0–28.0)
Calcium, Ion: 1.15 mmol/L (ref 1.15–1.40)
Calcium, Ion: 1.16 mmol/L (ref 1.15–1.40)
HCT: 40 % (ref 36.0–46.0)
HCT: 40 % (ref 36.0–46.0)
Hemoglobin: 13.6 g/dL (ref 12.0–15.0)
Hemoglobin: 13.6 g/dL (ref 12.0–15.0)
O2 Saturation: 68 %
O2 Saturation: 70 %
Potassium: 3.5 mmol/L (ref 3.5–5.1)
Potassium: 3.6 mmol/L (ref 3.5–5.1)
Sodium: 138 mmol/L (ref 135–145)
Sodium: 138 mmol/L (ref 135–145)
TCO2: 27 mmol/L (ref 22–32)
TCO2: 29 mmol/L (ref 22–32)
pCO2, Ven: 36.1 mmHg — ABNORMAL LOW (ref 44.0–60.0)
pCO2, Ven: 37.5 mmHg — ABNORMAL LOW (ref 44.0–60.0)
pH, Ven: 7.471 — ABNORMAL HIGH (ref 7.250–7.430)
pH, Ven: 7.473 — ABNORMAL HIGH (ref 7.250–7.430)
pO2, Ven: 33 mmHg (ref 32.0–45.0)
pO2, Ven: 34 mmHg (ref 32.0–45.0)

## 2021-11-30 LAB — BASIC METABOLIC PANEL
Anion gap: 11 (ref 5–15)
BUN: 12 mg/dL (ref 6–20)
CO2: 25 mmol/L (ref 22–32)
Calcium: 8.8 mg/dL — ABNORMAL LOW (ref 8.9–10.3)
Chloride: 101 mmol/L (ref 98–111)
Creatinine, Ser: 0.94 mg/dL (ref 0.44–1.00)
GFR, Estimated: >60 mL/min (ref >60)
Glucose, Bld: 107 mg/dL — ABNORMAL HIGH (ref 70–99)
Potassium: 3.2 mmol/L — ABNORMAL LOW (ref 3.5–5.1)
Sodium: 137 mmol/L (ref 135–145)

## 2021-11-30 LAB — ANA W/REFLEX IF POSITIVE: Anti Nuclear Antibody (ANA): NEGATIVE

## 2021-11-30 LAB — CBC
HCT: 36 % (ref 36.0–46.0)
Hemoglobin: 10.5 g/dL — ABNORMAL LOW (ref 12.0–15.0)
MCH: 21.5 pg — ABNORMAL LOW (ref 26.0–34.0)
MCHC: 29.2 g/dL — ABNORMAL LOW (ref 30.0–36.0)
MCV: 73.6 fL — ABNORMAL LOW (ref 80.0–100.0)
Platelets: 335 10*3/uL (ref 150–400)
RBC: 4.89 MIL/uL (ref 3.87–5.11)
RDW: 19.6 % — ABNORMAL HIGH (ref 11.5–15.5)
WBC: 7.8 10*3/uL (ref 4.0–10.5)
nRBC: 0 % (ref 0.0–0.2)

## 2021-11-30 LAB — ANTI-DNA ANTIBODY, DOUBLE-STRANDED: ds DNA Ab: <1 IU/mL (ref 0–9)

## 2021-11-30 LAB — HEMOGLOBIN A1C
Hgb A1c MFr Bld: 5.9 % — ABNORMAL HIGH (ref 4.8–5.6)
Mean Plasma Glucose: 123 mg/dL

## 2021-11-30 LAB — BETA 2 MICROGLOBULIN, SERUM: Beta-2 Microglobulin: 1.8 mg/L (ref 0.6–2.4)

## 2021-11-30 LAB — C3 COMPLEMENT: C3 Complement: 172 mg/dL — ABNORMAL HIGH (ref 82–167)

## 2021-11-30 SURGERY — RIGHT/LEFT HEART CATH AND CORONARY ANGIOGRAPHY
Anesthesia: LOCAL

## 2021-11-30 MED ORDER — ENOXAPARIN SODIUM 40 MG/0.4ML IJ SOSY
40.0000 mg | PREFILLED_SYRINGE | INTRAMUSCULAR | Status: DC
  Administered 2021-12-01 – 2021-12-03 (×3): 40 mg via SUBCUTANEOUS
  Filled 2021-11-30 (×3): qty 0.4

## 2021-11-30 MED ORDER — HEPARIN (PORCINE) IN NACL 1000-0.9 UT/500ML-% IV SOLN
INTRAVENOUS | Status: DC | PRN
  Administered 2021-11-30 (×2): 500 mL

## 2021-11-30 MED ORDER — HYDRALAZINE HCL 20 MG/ML IJ SOLN
INTRAMUSCULAR | Status: DC | PRN
  Administered 2021-11-30: 10 mg via INTRAVENOUS

## 2021-11-30 MED ORDER — ACETAMINOPHEN 325 MG PO TABS
650.0000 mg | ORAL_TABLET | ORAL | Status: DC | PRN
  Administered 2021-11-30: 650 mg via ORAL
  Filled 2021-11-30: qty 2

## 2021-11-30 MED ORDER — SODIUM CHLORIDE 0.9% FLUSH
3.0000 mL | Freq: Two times a day (BID) | INTRAVENOUS | Status: DC
  Administered 2021-11-30 – 2021-12-03 (×4): 3 mL via INTRAVENOUS

## 2021-11-30 MED ORDER — SODIUM CHLORIDE 0.9% FLUSH: 3.0000 mL | INTRAVENOUS | Status: DC | PRN

## 2021-11-30 MED ORDER — HYDRALAZINE HCL 20 MG/ML IJ SOLN
INTRAMUSCULAR | Status: AC
  Filled 2021-11-30: qty 1

## 2021-11-30 MED ORDER — HEPARIN SODIUM (PORCINE) 1000 UNIT/ML IJ SOLN
INTRAMUSCULAR | Status: DC | PRN
  Administered 2021-11-30: 4000 [IU] via INTRAVENOUS

## 2021-11-30 MED ORDER — VERAPAMIL HCL 2.5 MG/ML IV SOLN
INTRAVENOUS | Status: DC | PRN
  Administered 2021-11-30: 10 mL via INTRA_ARTERIAL

## 2021-11-30 MED ORDER — MIDAZOLAM HCL 2 MG/2ML IJ SOLN
INTRAMUSCULAR | Status: AC
  Filled 2021-11-30: qty 2

## 2021-11-30 MED ORDER — ONDANSETRON HCL 4 MG/2ML IJ SOLN: 4.0000 mg | Freq: Four times a day (QID) | INTRAMUSCULAR | Status: DC | PRN

## 2021-11-30 MED ORDER — MIDAZOLAM HCL 2 MG/2ML IJ SOLN
INTRAMUSCULAR | Status: DC | PRN
  Administered 2021-11-30: 1 mg via INTRAVENOUS

## 2021-11-30 MED ORDER — SACUBITRIL-VALSARTAN 24-26 MG PO TABS
1.0000 | ORAL_TABLET | Freq: Two times a day (BID) | ORAL | Status: DC
  Administered 2021-11-30 – 2021-12-01 (×2): 1 via ORAL
  Filled 2021-11-30 (×2): qty 1

## 2021-11-30 MED ORDER — FENTANYL CITRATE (PF) 100 MCG/2ML IJ SOLN
INTRAMUSCULAR | Status: AC
  Filled 2021-11-30: qty 2

## 2021-11-30 MED ORDER — HYDRALAZINE HCL 20 MG/ML IJ SOLN: 10.0000 mg | INTRAMUSCULAR | Status: AC | PRN

## 2021-11-30 MED ORDER — SODIUM CHLORIDE 0.9 % IV BOLUS
250.0000 mL | Freq: Once | INTRAVENOUS | Status: AC
  Administered 2021-11-30: 250 mL via INTRAVENOUS

## 2021-11-30 MED ORDER — SODIUM CHLORIDE 0.9 % IV SOLN: INTRAVENOUS | Status: AC

## 2021-11-30 MED ORDER — LABETALOL HCL 5 MG/ML IV SOLN: 10.0000 mg | INTRAVENOUS | Status: AC | PRN

## 2021-11-30 MED ORDER — SODIUM CHLORIDE 0.9 % IV SOLN: 250.0000 mL | INTRAVENOUS | Status: DC | PRN

## 2021-11-30 MED ORDER — CARVEDILOL 3.125 MG PO TABS
3.1250 mg | ORAL_TABLET | Freq: Two times a day (BID) | ORAL | Status: DC
  Administered 2021-12-01 – 2021-12-02 (×4): 3.125 mg via ORAL
  Filled 2021-11-30 (×4): qty 1

## 2021-11-30 MED ORDER — IOHEXOL 350 MG/ML SOLN
INTRAVENOUS | Status: DC | PRN
  Administered 2021-11-30: 35 mL

## 2021-11-30 MED ORDER — POTASSIUM CHLORIDE CRYS ER 20 MEQ PO TBCR
40.0000 meq | EXTENDED_RELEASE_TABLET | ORAL | Status: DC
  Administered 2021-11-30: 40 meq via ORAL
  Filled 2021-11-30: qty 2

## 2021-11-30 MED ORDER — LIDOCAINE HCL (PF) 1 % IJ SOLN
INTRAMUSCULAR | Status: DC | PRN
  Administered 2021-11-30 (×2): 2 mL

## 2021-11-30 MED ORDER — HEPARIN (PORCINE) IN NACL 1000-0.9 UT/500ML-% IV SOLN
INTRAVENOUS | Status: AC
  Filled 2021-11-30: qty 500

## 2021-11-30 MED ORDER — FENTANYL CITRATE (PF) 100 MCG/2ML IJ SOLN
INTRAMUSCULAR | Status: DC | PRN
  Administered 2021-11-30: 25 ug via INTRAVENOUS

## 2021-11-30 MED ORDER — VERAPAMIL HCL 2.5 MG/ML IV SOLN
INTRAVENOUS | Status: AC
  Filled 2021-11-30: qty 2

## 2021-11-30 MED ORDER — HEPARIN SODIUM (PORCINE) 1000 UNIT/ML IJ SOLN
INTRAMUSCULAR | Status: AC
  Filled 2021-11-30: qty 10

## 2021-11-30 MED ORDER — SPIRONOLACTONE 25 MG PO TABS
25.0000 mg | ORAL_TABLET | Freq: Every day | ORAL | Status: DC
  Administered 2021-12-01 – 2021-12-03 (×3): 25 mg via ORAL
  Filled 2021-11-30 (×3): qty 1

## 2021-11-30 SURGICAL SUPPLY — 11 items
CATH 5FR JL3.5 JR4 ANG PIG MP (CATHETERS) ×1 IMPLANT
CATH BALLN WEDGE 5F 110CM (CATHETERS) ×1 IMPLANT
DEVICE RAD COMP TR BAND LRG (VASCULAR PRODUCTS) ×1 IMPLANT
GLIDESHEATH SLEND SS 6F .021 (SHEATH) ×1 IMPLANT
GUIDEWIRE INQWIRE 1.5J.035X260 (WIRE) IMPLANT
INQWIRE 1.5J .035X260CM (WIRE) ×2
KIT HEART LEFT (KITS) ×2 IMPLANT
PACK CARDIAC CATHETERIZATION (CUSTOM PROCEDURE TRAY) ×2 IMPLANT
SHEATH GLIDE SLENDER 4/5FR (SHEATH) ×1 IMPLANT
SHEATH PROBE COVER 6X72 (BAG) ×1 IMPLANT
TRANSDUCER W/STOPCOCK (MISCELLANEOUS) ×2 IMPLANT

## 2021-11-30 NOTE — Progress Notes (Signed)
FPTS Brief Progress Note  S:Pt sleeping    O: BP (!) 138/95    Pulse 92    Temp 98.5 F (36.9 C) (Oral)    Resp 18    Ht 4\' 10"  (1.473 m)    Wt 77.7 kg    LMP 10/07/2021 (Approximate)    SpO2 98%    BMI 35.80 kg/m    GEN: sleeping RESP; equal chest rise and fall CVS: NSR on monitor    A/P: Acute HF moderately reduced EF: VSS no changes to current plan. Scheduled for L/R heart cath tomorrow, per cardiology. Pt NPO at MN.  - Orders reviewed. Labs for AM ordered, which was adjusted as needed.    14/08/2021, DO 11/30/2021, 12:09 AM PGY-3, [place] [facility] Night Resident  Please page [phone] with questions.

## 2021-11-30 NOTE — Progress Notes (Incomplete)
Family Medicine Teaching Service Daily Progress Note Intern Pager: [phone] *** Change Date  Patient name: Alice Hays Medical record number: [id number] Date of birth: 01-30-1981 Age: 41 y.o. 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 PMHx significant for HTN, anemia, multiple miscarriages.   Acute systolic heart failure basal inferior/inferolateral and lateral hypokinesis with EF 45% and LV mild dilation with LVH. LA mildly dilated with moderate MV regurg. Cardiology consulted and recommended R/L heart cath on 2/3 in addition to cMRI. Findings showed ***.  Results are as follows ***. Entresto and Spironolactone started for CHF regimen.  -Sherryll Burger 24-26mg  -Spiro 12.5mg    HTN, poorly controlled  Could have contributed to onset of CHF ***. BP today ***. Avoiding BB in setting of acute CHF  -Spiro and entresto as above   Hypokalemia  Replete as necessary, >4  Anterior Mediastinal Mass  -CTVS outpatient   Multiple Miscarriages  Testing for hypercoagulable syndromes, labs pending  -Pending labs: beta 2 microglobulin, cardiolipin ab, lupus anticoag, ANA, Anti-DNA, C3 complement   IDA  Ferreheme provided on 2/2 -Regular Am CBC   Left hemi liver cyst   Transaminitis  Benign, abdominal u/s if concern clinically.  -Monitor     FEN/GI: Heart Healthy  PPx: Lovenox  Dispo:Home pending clinical improvement   Subjective:  ***  Objective: Temp:  [98.2 F (36.8 C)-98.7 F (37.1 C)] 98.7 F (37.1 C) (02/03 1402) Pulse Rate:  [73-93] 73 (02/03 1402) Resp:  [16-20] 16 (02/03 1402) BP: (95-162)/(56-102) 95/56 (02/03 1402) SpO2:  [94 %-100 %] 97 % (02/03 1402) Weight:  [76.3 kg-77.7 kg] 76.3 kg  (02/03 0354) Physical Exam: General: *** Cardiovascular: *** Respiratory: *** Abdomen: *** Extremities: ***  Laboratory: Recent Labs  Lab 11/28/21 1617 11/28/21 1732 11/29/21 0537 11/30/21 0146 11/30/21 1328  WBC 8.5  --  8.6 7.8  --   HGB 10.0*   < > 10.4* 10.5* 13.6   13.6  HCT 35.5*   < > 35.2* 36.0 40.0   40.0  PLT 333  --  340 335  --    < > = values in this interval not displayed.   Recent Labs  Lab 11/28/21 1617 11/28/21 1732 11/29/21 0537 11/30/21 0146 11/30/21 1328  NA 136   < > 137 137 138   138  K 4.0   < > 4.5 3.2* 3.6   3.5  CL 103  --  107 101  --   CO2 25  --  23 25  --   BUN 13  --  10 12  --   CREATININE 0.84  --  0.80 0.94  --   CALCIUM 8.5*  --  8.3* 8.8*  --   PROT 5.8*  --  6.0*  --   --   BILITOT 0.5  --  1.1  --   --   ALKPHOS 66  --  57  --   --   ALT 50*  --  47*  --   --   AST 37  --  45*  --   --   GLUCOSE 109*  --  103* 107*  --    < > = values in this  interval not displayed.     Alfredo Martinez, MD 11/30/2021, 2:20 PM PGY-1, [facility] Family Medicine FPTS Intern pager: [phone], text pages welcome

## 2021-11-30 NOTE — TOC Initial Note (Addendum)
Transition of Care [facility]) - Initial/Assessment Note    Patient Details  Name: Alice Hays MRN: [id number] Date of Birth: 1981/09/26  Transition of Care [facility]) CM/SW Contact:    Erenest Rasher, RN Phone Number: [phone] 11/30/2021, 4:45 PM  Clinical Narrative:                 HF TOC CM sent for benefits check for Entresto. Pt will be able to use Farxiga copay card $0 and Entresto $10 copay card. Provided pt with copay card. Pt requesting a note for work at [employer]. Attending made aware. She goes to [facility] on [phone]. Her pharmacy has Wilder Glade and Delene Loll has both meds in stock.    Expected Discharge Plan: Home/Self Care Barriers to Discharge: Continued Medical Work up   Patient Goals and CMS Choice Patient states their goals for this hospitalization and ongoing recovery are:: wants to get back to her baseline CMS Medicare.gov Compare Post Acute Care list provided to:: Patient    Expected Discharge Plan and Services Expected Discharge Plan: Home/Self Care   Discharge Planning Services: CM Consult, Medication Assistance   Living arrangements for the past 2 months: Single Family Home                                      Prior Living Arrangements/Services Living arrangements for the past 2 months: Single Family Home     Do you feel safe going back to the place where you live?: Yes      Need for Family Participation in Patient Care: No (Comment) Care giver support system in place?: No (comment)   Criminal Activity/Legal Involvement Pertinent to Current Situation/Hospitalization: No - Comment as needed  Activities of Daily Living Home Assistive Devices/Equipment: Blood pressure cuff ADL Screening (condition at time of admission) Patient's cognitive ability adequate to safely complete daily activities?: Yes Is the patient deaf or have difficulty hearing?: No Does the patient have difficulty seeing, even when wearing  glasses/contacts?: No Does the patient have difficulty concentrating, remembering, or making decisions?: No Patient able to express need for assistance with ADLs?: Yes Does the patient have difficulty dressing or bathing?: Yes Independently performs ADLs?: Yes (appropriate for developmental age) Does the patient have difficulty walking or climbing stairs?: No Weakness of Legs: None Weakness of Arms/Hands: None  Permission Sought/Granted Permission sought to share information with : Case Manager, Family Supports, PCP Permission granted to share information with : Yes, Verbal Permission Granted              Emotional Assessment Appearance:: Appears stated age Attitude/Demeanor/Rapport: Gracious Affect (typically observed): Accepting Orientation: : Oriented to Self, Oriented to Place, Oriented to  Time, Oriented to Situation   Psych Involvement: No (comment)  Admission diagnosis:  Heart failure ([place]) [I50.9] Acute congestive heart failure, unspecified heart failure type [facility]) [I50.9] Patient Active Problem List   Diagnosis Date Noted   Acute congestive heart failure ([place]) 11/29/2021   Miscarriage 01/28/2021   Essential hypertension 01/02/2019   Labor abnormal 06/29/2016    Postpartum care s/p cesarean section (9/2) 06/29/2016   PCP:  Kathyrn Lass Pharmacy:   [address], [address] [place] [address] Phone: [phone] Fax: [phone]  [facility] [address] [place] [address] Phone: [phone] Fax: [phone]  Social Determinants of Health (SDOH) Interventions    Readmission Risk Interventions No flowsheet data found.

## 2021-11-30 NOTE — Progress Notes (Signed)
Family Medicine Teaching Service Daily Progress Note Intern Pager: [phone]  Patient name: Alice Hays Medical record number: [id number] Date of birth: 19-Apr-1981 Age: 41 y.o. 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  Feels like her abdominal bloating has also improved.Has been saturating well on room air.  Received heart cath yesterday which showed no significant coronary disease/nonischemic cardiomyopathy, Low filling pressures, preserved cardiac output. Urine output was 1.5 L charted and says that she has been going to the bathroom regularly overnight.  Did receive 75 mL normal saline infusion during cath ANA was negative, double-stranded DNA antibody <1, C3 complement elevated at 172 less likely to be related to lupus.  On examination lower extremity swelling appeared to be resolving, trace edema.  -Heart failure team following, appreciate recommendations -Spironolactone 25 mg daily -Losartan 25 mg daily -Carvedilol 3.125 twice daily -Await cMRI  -AM CBC, CMP  HTN   intermittent Hypotensive Episodes Blood pressure ranges have been 82-162/53-102.  Did have some hypotension yesterday before her catheterization around 80s/50s to 90s over 50s.  Denies any dizziness while standing.  Overnight seem to be improving and was more normotensive.  Got entresto x 1, verapamil during cath, hydralazine x 2 yesterday. Received 75 mL NS infusion around cath from 1500 to 1900. -Spironolactone 25 mg daily -Losartan 25 mg daily -Carvedilol 3.125 twice daily -monitor BP  Anterior mediastinal mass Noted on admission CTA.  Unlikely to be thyroid disease given TSH was 1.5, and ANA, dsDNA, C3 negative for lupus.  Can  consider work-up for myasthenia gravis although patient does not have ptosis or bulbar weakness. -outpatient CT surgery f/u  Multiple miscarriages Beta-2 microglobulin was 1.8. Cardiolipin antibodies and lupus anticoagulant panel still pending.  ANA anti-DNA and C3 complement negative.  Less likely to be lupus. -Follow-up antibody labs  Iron deficiency anemia Hemoglobin has doubled 11.5 today.  S/p Feraheme x1 2/2. -Monitor  Left hemiliver cyst   transaminitis No abdominal pain on examination.   -Monitor -Consider abdominal ultrasound if symptomatic  FEN/GI: Heart healthy PPx: Lovenox Dispo: Pending c MRI, continue medical Work-up  Subjective:  Says that she feels much better this morning.  Says her shortness of breath has improved greatly as well as her leg swelling.  Denies any dizziness while standing and overall feels improved.  Objective: Temp:  [97.8 F (36.6 C)-99.1 F (37.3 C)] 99.1 F (37.3 C) (02/04 0352) Pulse Rate:  [65-98] 65 (02/03 1947) Resp:  [16-19] 17 (02/04 0352) BP: (82-162)/(53-102) 125/87 (02/04 0352) SpO2:  [94 %-100 %] 94 % (02/04 0352) Weight:  [76.3 kg] 76.3 kg (02/04 0352) Physical Exam: General: NAD, laying in bed comfortably, alert and responsive to questions Cardiovascular: RRR no murmurs rubs or gallops Respiratory: Clear to auscultation bilaterally no wheezes rales or crackles, no increased work of breathing Abdomen: Soft, nontender to palpation, normoactive bowel sounds Extremities: Trace edema in lower extremity, much improved  Laboratory: Recent Labs  Lab 11/29/21 0537 11/30/21 0146 11/30/21 1328 12/01/21 0430  WBC 8.6 7.8  --  7.4  HGB 10.4* 10.5* 13.6   13.6 11.5*  HCT 35.2* 36.0 40.0   40.0 39.0  PLT 340 335  --  347  Recent Labs  Lab 11/28/21 1617 11/28/21 1732 11/29/21 0537 11/30/21 0146 11/30/21 1328 12/01/21 0430  NA 136   < > 137 137 138   138 135  K 4.0   < > 4.5 3.2* 3.6   3.5 3.6  CL 103  --  107 101  --  103   CO2 25  --  23 25  --  24  BUN 13  --  10 12  --  14  CREATININE 0.84  --  0.80 0.94  --  0.99  CALCIUM 8.5*  --  8.3* 8.8*  --  8.8*  PROT 5.8*  --  6.0*  --   --   --   BILITOT 0.5  --  1.1  --   --   --   ALKPHOS 66  --  57  --   --   --   ALT 50*  --  47*  --   --   --   AST 37  --  45*  --   --   --   GLUCOSE 109*  --  103* 107*  --  101*   < > = values in this interval not displayed.     Imaging/Diagnostic Tests: CARDIAC CATHETERIZATION  Result Date: 11/30/2021 1. No significant coronary disease, nonischemic cardiomyopathy. 2. Low filling pressures. 3. Preserved cardiac output.     Levin Erp, MD 12/01/2021, 6:43 AM PGY-1, [facility] Family Medicine FPTS Intern pager: [phone], text pages welcome

## 2021-11-30 NOTE — Interval H&P Note (Signed)
History and Physical Interval Note:  11/30/2021 1:12 PM  Alice Hays  has presented today for surgery, with the diagnosis of Congestive Heart Failure.  The various methods of treatment have been discussed with the patient and family. After consideration of risks, benefits and other options for treatment, the patient has consented to  Procedure(s): RIGHT/LEFT HEART CATH AND CORONARY ANGIOGRAPHY (N/A) as a surgical intervention.  The patient's history has been reviewed, patient examined, no change in status, stable for surgery.  I have reviewed the patient's chart and labs.  Questions were answered to the patient's satisfaction.     Brendi Mccarroll [employer]

## 2021-11-30 NOTE — Progress Notes (Addendum)
Advanced Heart Failure Rounding Note  PCP-Cardiologist: None   Subjective:    Yesterday diuresed with IV lasix. Started on GDMT. Given dose of feraheme.   Brisk diuresis noted.   Denies SOB. No complaints.    Objective:   Weight Range: 76.3 kg Body mass index is 35.16 kg/m.   Vital Signs:   Temp:  [98.3 F (36.8 C)-98.7 F (37.1 C)] 98.5 F (36.9 C) (02/03 0738) Pulse Rate:  [83-99] 89 (02/03 0854) Resp:  [17-29] 18 (02/03 0854) BP: (102-159)/(74-99) 122/99 (02/03 0738) SpO2:  [94 %-100 %] 95 % (02/03 0854) Weight:  [76.3 kg-77.7 kg] 76.3 kg (02/03 0354)    Weight change: Filed Weights   11/29/21 1339 11/29/21 1809 11/30/21 0354  Weight: 77.7 kg 77.7 kg 76.3 kg    Intake/Output:   Intake/Output Summary (Last 24 hours) at 11/30/2021 0950 Last data filed at 11/30/2021 0356 Gross per 24 hour  Intake 580 ml  Output 2900 ml  Net -2320 ml      Physical Exam    General:  No resp difficulty HEENT: Normal Neck: Supple. JVP 5-6 . Carotids 2+ bilat; no bruits. No lymphadenopathy or thyromegaly appreciated. Cor: PMI nondisplaced. Regular rate & rhythm. No rubs, gallops or murmurs. Lungs: Clear Abdomen: Soft, nontender, nondistended. No hepatosplenomegaly. No bruits or masses. Good bowel sounds. Extremities: No cyanosis, clubbing, rash, edema Neuro: Alert & orientedx3, cranial nerves grossly intact. moves all 4 extremities w/o difficulty. Affect pleasant   Telemetry  SR 80s  EKG    N/A   Labs    CBC Recent Labs    11/29/21 0537 11/30/21 0146  WBC 8.6 7.8  HGB 10.4* 10.5*  HCT 35.2* 36.0  MCV 75.1* 73.6*  PLT 340 335   Basic Metabolic Panel Recent Labs    93/26/71 0537 11/30/21 0146  NA 137 137  K 4.5 3.2*  CL 107 101  CO2 23 25  GLUCOSE 103* 107*  BUN 10 12  CREATININE 0.80 0.94  CALCIUM 8.3* 8.8*   Liver Function Tests Recent Labs    11/28/21 1617 11/29/21 0537  AST 37 45*  ALT 50* 47*  ALKPHOS 66 57  BILITOT 0.5 1.1  PROT  5.8* 6.0*  ALBUMIN 3.1* 3.1*   No results for input(s): LIPASE, AMYLASE in the last 72 hours. Cardiac Enzymes No results for input(s): CKTOTAL, CKMB, CKMBINDEX, TROPONINI in the last 72 hours.  BNP: BNP (last 3 results) Recent Labs    11/28/21 1748  BNP 1,267.7*    ProBNP (last 3 results) No results for input(s): PROBNP in the last 8760 hours.   D-Dimer Recent Labs    11/28/21 1617  DDIMER 1.95*   Hemoglobin A1C Recent Labs    11/29/21 0537  HGBA1C 5.9*   Fasting Lipid Panel Recent Labs    11/29/21 0537  CHOL 208*  HDL 46  LDLCALC 147*  TRIG 77  CHOLHDL 4.5   Thyroid Function Tests Recent Labs    11/29/21 0537  TSH 1.500    Other results:   Imaging    No results found.   Medications:     Scheduled Medications:  enoxaparin (LOVENOX) injection  40 mg Subcutaneous Q24H   multivitamin with minerals  1 tablet Oral Daily   potassium chloride  40 mEq Oral Q4H   sacubitril-valsartan  1 tablet Oral BID   sodium chloride flush  3 mL Intravenous Q12H   spironolactone  12.5 mg Oral Daily    Infusions:  sodium chloride  sodium chloride 10 mL/hr at 11/30/21 0604    PRN Medications: sodium chloride, acetaminophen **OR** acetaminophen, albuterol, sodium chloride flush    Patient Profile  41 y.o. female with history of HTN and multiple prior miscarriages. Admitted with acute respiratory failure with hypoxia and new systolic CHF.   Assessment/Plan  Acute systolic CHF: -Echo EF 45%, RV mildly reduced, normal PASP, moderate MR -Etiology not certain. Does note family hx CHF in first degree relative. ? Hypertension. BP poorly controlled on presentation and has been out of home medications. No ETOH or drug use. Family medicine working up for possible autoimmune disorder with presence of anterior mediastinal mass -HS minimally elevated with flat trend -R/LHC today . Discussed.  - Will also need cMRI  -Volume status improved. Stop IV lasix. Adjust  diuretics post cath. Brisk  - Increase spiro to 25 mg daily.  -Start entresto 24-26 mg twice a day.  -No beta blocker for now with acute CHF -Should be able to add jardiance tomorrow.   2. Acute respiratory failure with hypoxia: -Likely d/t CHF -Off BiPAP. Stable on room air.    3. Hypertensive urgency: -BP markedly elevated on admit.  -Had been out of home regimen for at least a couple of months.  - Improving.    4. 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    5. IDA -Hgb 10.4 (chronically 10s) -Low iron stores -GivenIV iron   6. Hx multiple miscarriages -Not aware of any prior diagnosis of antiphospholipid antibody syndrome   Consult cardiac rehab.   Discussed meds with pharmacy. Working on [employer] for [employer] and [employer].    Plan for cath today.   Length of Stay: 1  Amy Clegg, NP  11/30/2021, 9:50 AM  Advanced Heart Failure Team Pager [phone] (M-F; 7a - 5p)  Please contact [facility] for night-coverage after hours (5p -7a ) and weekends on [email or web address]  Patient seen with NP, agree with the above note.   She diuresed well yesterday, left/right heart cath as below:   Coronary Findings  Diagnostic Dominance: Right Left Main  Vessel was injected. Vessel is normal in caliber. Vessel is angiographically normal.    Left Anterior Descending  Vessel was injected. Vessel is normal in caliber. Vessel is angiographically normal.    Left Circumflex  Vessel was injected. Vessel is normal in caliber. Vessel is angiographically normal.    Right Coronary Artery  Vessel was injected. Vessel is normal in caliber. Vessel is angiographically normal.    Intervention   No interventions have been documented.   Right Heart  Right Heart Pressures RHC Procedural Findings: Hemodynamics (mmHg) RA mean 1 RV 21/1 PA 22/4, mean 12 PCWP mean 2 LV 90/6 AO 92/56  Oxygen  saturations: PA 69% AO 98%  Cardiac Output (Fick) 5.43  Cardiac Index (Fick) 3.21   General: NAD Neck: No JVD, no thyromegaly or thyroid nodule.  Lungs: Clear to auscultation bilaterally with normal respiratory effort. CV: Nondisplaced PMI.  Heart regular S1/S2, no S3/S4, no murmur.  No peripheral edema.   Abdomen: Soft, nontender, no hepatosplenomegaly, no distention.  Skin: Intact without lesions or rashes.  Neurologic: Alert and oriented x 3.  Psych: Normal affect. Extremities: No clubbing or cyanosis.  HEENT: Normal.   1. Acute systolic CHF: Echo this admission with EF 40-45% range with mild LV dilation and mild LVH, moderate MR, moderate LAE, mildly decreased RV systolic function.  She has  uncontrolled HTN as a major risk factor.  HIV and ANA negative and TSH normal.  No ETOH or drug history with negative UDS. Spontaneous miscarriages concerning for antiphospholipid antibody syndrome, but when this affects the heart, it generally leads to valvular problems and not cardiomyopathy. No significant coronary disease on cath and filling pressures now normal on RHC after diuresis with normal cardiac output. BP low post-cath due to sedation, very high prior to cath with anxiety.  - Stop diuretics, probably will not need at home if she is on Entresto and spironolactone.   - Continue Entresto 24/26 bid when sedation has worn off and BP is higher again.   - Continue spironolactone 12.5 daily.  - Can start Coreg 3.125 mg bid this evening.  - Needs cardiac MRI to assess for infiltrative disease/myocarditis.  2.  HTN: Uncontrolled. May be cause of cardiomyopathy.  Adjust medications as above.  3.  H/o multiple miscarriages/spontaneous abortions: ?Antiphospholipid antibody syndrome.  - Will send serologic workup.  4. 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  5. 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  Would evaluate for signs of myasthenia gravis (per primary team).  Will need longitudinal followup of  this, TCTS plans to followup as outpatient.   Marca Ancona 11/30/2021 4:08 PM

## 2021-11-30 NOTE — TOC Benefit Eligibility Note (Signed)
Patient Advocate Encounter  Prior Authorization for Farxiga 10 mg has been approved.    PA#  [id number] Effective dates: 11/30/2021 through 11/29/2024  Patients co-pay is $125.00.     Roland Earl, CPhT Pharmacy Patient Advocate Specialist [facility] Patient Advocate Team Direct Number: [phone]  Fax: [phone]

## 2021-11-30 NOTE — Progress Notes (Signed)
Patient back to room 3E07 from cath lab. Vital signs obtained. On monitor. Right radial site and TR band clean, dry, and intact.  Sabra Heck, RN

## 2021-11-30 NOTE — TOC CM/SW Note (Addendum)
HF TOC CM sent for benefits check for Entresto. Pt will be able to use Farxiga copay card $0 and Entresto $10 copay card. Provided pt with copay cards.  [place], Heart Failure TOC CM [phone]

## 2021-11-30 NOTE — Progress Notes (Signed)
BP 82/53. MD notified. Ordered bolus. Sabra Heck, RN

## 2021-11-30 NOTE — TOC Benefit Eligibility Note (Signed)
Patient Advocate Encounter   Received notification that prior authorization for Farxiga 10 mg is required.   PA submitted on 11/30/2021 Key [id number] Status is pending       Roland Earl, CPhT Pharmacy Patient Advocate Specialist [facility] Patient Advocate Team Direct Number: [phone]  Fax: [phone]

## 2021-12-01 LAB — BASIC METABOLIC PANEL
Anion gap: 8 (ref 5–15)
BUN: 14 mg/dL (ref 6–20)
CO2: 24 mmol/L (ref 22–32)
Calcium: 8.8 mg/dL — ABNORMAL LOW (ref 8.9–10.3)
Chloride: 103 mmol/L (ref 98–111)
Creatinine, Ser: 0.99 mg/dL (ref 0.44–1.00)
GFR, Estimated: >60 mL/min (ref >60)
Glucose, Bld: 101 mg/dL — ABNORMAL HIGH (ref 70–99)
Potassium: 3.6 mmol/L (ref 3.5–5.1)
Sodium: 135 mmol/L (ref 135–145)

## 2021-12-01 LAB — CBC
HCT: 39 % (ref 36.0–46.0)
Hemoglobin: 11.5 g/dL — ABNORMAL LOW (ref 12.0–15.0)
MCH: 22.2 pg — ABNORMAL LOW (ref 26.0–34.0)
MCHC: 29.5 g/dL — ABNORMAL LOW (ref 30.0–36.0)
MCV: 75.4 fL — ABNORMAL LOW (ref 80.0–100.0)
Platelets: 347 10*3/uL (ref 150–400)
RBC: 5.17 MIL/uL — ABNORMAL HIGH (ref 3.87–5.11)
RDW: 19.9 % — ABNORMAL HIGH (ref 11.5–15.5)
WBC: 7.4 10*3/uL (ref 4.0–10.5)
nRBC: 0 % (ref 0.0–0.2)

## 2021-12-01 LAB — LUPUS ANTICOAGULANT PANEL
DRVVT: 22.6 s (ref 0.0–47.0)
PTT Lupus Anticoagulant: 28.7 s (ref 0.0–51.9)

## 2021-12-01 MED ORDER — POTASSIUM CHLORIDE CRYS ER 20 MEQ PO TBCR
40.0000 meq | EXTENDED_RELEASE_TABLET | Freq: Once | ORAL | Status: AC
  Administered 2021-12-01: 40 meq via ORAL
  Filled 2021-12-01: qty 2

## 2021-12-01 MED ORDER — SACUBITRIL-VALSARTAN 49-51 MG PO TABS
1.0000 | ORAL_TABLET | Freq: Two times a day (BID) | ORAL | Status: DC
  Administered 2021-12-01 – 2021-12-03 (×4): 1 via ORAL
  Filled 2021-12-01 (×4): qty 1

## 2021-12-01 NOTE — Progress Notes (Signed)
Advanced Heart Failure Rounding Note  PCP-Cardiologist: None   Subjective:    Feels good. Denies CP or SOB.    Objective:   Weight Range: 76.3 kg Body mass index is 35.17 kg/m.   Vital Signs:   Temp:  [97.8 F (36.6 C)-99.1 F (37.3 C)] 98.8 F (37.1 C) (02/04 1103) Pulse Rate:  [65-98] 85 (02/04 1103) Resp:  [16-18] 18 (02/04 1103) BP: (82-143)/(53-98) 143/98 (02/04 1103) SpO2:  [94 %-98 %] 95 % (02/04 1103) Weight:  [76.3 kg] 76.3 kg (02/04 0352)    Weight change: Filed Weights   11/29/21 1809 11/30/21 0354 12/01/21 0352  Weight: 77.7 kg 76.3 kg 76.3 kg    Intake/Output:   Intake/Output Summary (Last 24 hours) at 12/01/2021 1332 Last data filed at 12/01/2021 1236 Gross per 24 hour  Intake 1482.36 ml  Output 1800 ml  Net -317.64 ml       Physical Exam    General:  Well appearing. No resp difficulty HEENT: normal Neck: supple. no JVD. Carotids 2+ bilat; no bruits. No lymphadenopathy or thryomegaly appreciated. Cor: PMI nondisplaced. Regular rate & rhythm. No rubs, gallops or murmurs. Lungs: clear Abdomen: soft, nontender, nondistended. No hepatosplenomegaly. No bruits or masses. Good bowel sounds. Extremities: no cyanosis, clubbing, rash, edema Neuro: alert & orientedx3, cranial nerves grossly intact. moves all 4 extremities w/o difficulty. Affect pleasant   Telemetry   SR 80s Personally reviewed  Labs    CBC Recent Labs    11/30/21 0146 11/30/21 1328 12/01/21 0430  WBC 7.8  --  7.4  HGB 10.5* 13.6   13.6 11.5*  HCT 36.0 40.0   40.0 39.0  MCV 73.6*  --  75.4*  PLT 335  --  [id number]    Basic Metabolic Panel Recent Labs    11/30/21 0146 11/30/21 1328 12/01/21 0430  NA 137 138   138 135  K 3.2* 3.6   3.5 3.6  CL 101  --  103  CO2 25  --  24  GLUCOSE 107*  --  101*  BUN 12  --  14  CREATININE 0.94  --  0.99  CALCIUM 8.8*  --  8.8*    Liver Function Tests Recent Labs    11/28/21 1617 11/29/21 0537  AST 37 45*  ALT 50* 47*   ALKPHOS 66 57  BILITOT 0.5 1.1  PROT 5.8* 6.0*  ALBUMIN 3.1* 3.1*    No results for input(s): LIPASE, AMYLASE in the last 72 hours. Cardiac Enzymes No results for input(s): CKTOTAL, CKMB, CKMBINDEX, TROPONINI in the last 72 hours.  BNP: BNP (last 3 results) Recent Labs    11/28/21 1748  BNP 1,267.7*     ProBNP (last 3 results) No results for input(s): PROBNP in the last 8760 hours.   D-Dimer Recent Labs    11/28/21 1617  DDIMER 1.95*    Hemoglobin A1C Recent Labs    11/29/21 0537  HGBA1C 5.9*    Fasting Lipid Panel Recent Labs    11/29/21 0537  CHOL 208*  HDL 46  LDLCALC 147*  TRIG 77  CHOLHDL 4.5    Thyroid Function Tests Recent Labs    11/29/21 0537  TSH 1.500     Other results:   Imaging    No results found.   Medications:     Scheduled Medications:  carvedilol  3.125 mg Oral BID WC   enoxaparin (LOVENOX) injection  40 mg Subcutaneous Q24H   multivitamin with minerals  1 tablet Oral  Daily   sacubitril-valsartan  1 tablet Oral BID   sodium chloride flush  3 mL Intravenous Q12H   spironolactone  25 mg Oral Daily    Infusions:  sodium chloride      PRN Medications: sodium chloride, acetaminophen, albuterol, ondansetron (ZOFRAN) IV, sodium chloride flush    Patient Profile  41 y.o. female with history of HTN and multiple prior miscarriages. Admitted with acute respiratory failure with hypoxia and new systolic CHF.   Assessment/Plan  Acute systolic CHF: -Echo EF [id number], RV mildly reduced, normal PASP, moderate MR -Etiology not certain. Does note family hx CHF in first degree relative. ? Hypertension. BP poorly controlled on presentation and has been out of home medications. No ETOH or drug use. Family medicine working up for possible autoimmune disorder with presence of anterior mediastinal mass. No significant coronary disease on cath 2/3 and filling pressures now normal on RHC after diuresis with normal cardiac output. -  Stop diuretics, probably will not need at home if she is on Entresto and spironolactone.   - Increase Entresto to 49/51 bid - Continue spironolactone 12.5 daily.  - Continue carvedilol 3.125 mg bid  - Add Jardiance in am  - Needs cardiac MRI onMonday to assess for infiltrative disease/myocarditis.   2. Acute respiratory failure with hypoxia: -Likely d/t CHF -Off BiPAP. Stable on room air.    3. Hypertensive urgency: -BP markedly elevated on admit.  -Had been out of home regimen for at least a couple of months.  - Improving.  -Med titration as above   4. 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  Would evaluate for signs of myasthenia gravis (per primary team).  Will need longitudinal followup of this, TCTS plans to followup as outpatient.    5. IDA -Hgb 10.4 -> 11.5 (chronically 10s) -Low iron stores -Given IV iron   6. Hx multiple miscarriages -Not aware of any prior diagnosis of antiphospholipid antibody syndrome    Length of Stay: 2  Glori Bickers, MD  12/01/2021, 1:32 PM  Advanced Heart Failure Team Pager [phone] (M-F; 7a - 5p)  Please contact [facility] for night-coverage after hours (5p -7a ) and weekends on [email or web address]  1:32 PM

## 2021-12-01 NOTE — Progress Notes (Signed)
FPTS Brief Progress Note  S: Pt sleeping    O: BP 123/89 (BP Location: Left Arm)    Pulse 65    Temp 97.8 F (36.6 C) (Oral)    Resp 17    Ht 4\' 10"  (1.473 m)    Wt 76.3 kg    LMP 10/07/2021 (Approximate)    SpO2 95%    BMI 35.16 kg/m    GEN: sleeping comfortably RESP : light snore, equal chest rise and fall    A/P: Acute ?preserved HF: cardiology team planning for cMRI after normal R/L heart cath. - Orders reviewed. Labs for AM ordered, which was adjusted as needed.    Lyndee Hensen, DO 12/01/2021, 1:50 AM PGY-3, [facility] Night Resident  Please page [phone] with questions.

## 2021-12-01 NOTE — Progress Notes (Signed)
FPTS Brief Progress Note  S:Patient sleeping when I came by the room.    O: BP (!) 143/98 (BP Location: Left Arm)    Pulse 85    Temp 98.8 F (37.1 C) (Oral)    Resp 18    Ht 4\' 10"  (1.473 m)    Wt 76.3 kg    LMP 10/07/2021 (Approximate)    SpO2 95%    BMI 35.17 kg/m    General: sleeping, NAD Resp: Normal WOB on RA   A/P:  Acute systolic CHF Heart failure following, appreciate continued care and recommendations  -  Entresto  49/51 bid, spironolactone 12.5 daily, carvedilol 3.125 mg bid  - add Jardiance in am  - cardiac MRI Monday  Remainder of plan per daily progress note   - Orders reviewed. Labs for AM ordered, which was adjusted as needed.    Shary Key, DO 12/01/2021, 11:37 PM PGY-2, [place] Night Resident  Please page [phone] with questions.

## 2021-12-02 DIAGNOSIS — I5021 Acute systolic (congestive) heart failure: Secondary | ICD-10-CM

## 2021-12-02 DIAGNOSIS — J9859 Other diseases of mediastinum, not elsewhere classified: Secondary | ICD-10-CM

## 2021-12-02 DIAGNOSIS — I429 Cardiomyopathy, unspecified: Secondary | ICD-10-CM

## 2021-12-02 DIAGNOSIS — K7689 Other specified diseases of liver: Secondary | ICD-10-CM

## 2021-12-02 LAB — CBC
HCT: 39.6 % (ref 36.0–46.0)
Hemoglobin: 11.7 g/dL — ABNORMAL LOW (ref 12.0–15.0)
MCH: 22.5 pg — ABNORMAL LOW (ref 26.0–34.0)
MCHC: 29.5 g/dL — ABNORMAL LOW (ref 30.0–36.0)
MCV: 76 fL — ABNORMAL LOW (ref 80.0–100.0)
Platelets: 378 10*3/uL (ref 150–400)
RBC: 5.21 MIL/uL — ABNORMAL HIGH (ref 3.87–5.11)
RDW: 20.2 % — ABNORMAL HIGH (ref 11.5–15.5)
WBC: 6.4 10*3/uL (ref 4.0–10.5)
nRBC: 0.8 % — ABNORMAL HIGH (ref 0.0–0.2)

## 2021-12-02 LAB — CARDIOLIPIN ANTIBODIES, IGM+IGG
Anticardiolipin IgG: <9 GPL U/mL (ref 0–14)
Anticardiolipin IgM: <9 MPL U/mL (ref 0–12)

## 2021-12-02 LAB — BASIC METABOLIC PANEL
Anion gap: 7 (ref 5–15)
BUN: 12 mg/dL (ref 6–20)
CO2: 24 mmol/L (ref 22–32)
Calcium: 8.5 mg/dL — ABNORMAL LOW (ref 8.9–10.3)
Chloride: 105 mmol/L (ref 98–111)
Creatinine, Ser: 0.87 mg/dL (ref 0.44–1.00)
GFR, Estimated: >60 mL/min (ref >60)
Glucose, Bld: 109 mg/dL — ABNORMAL HIGH (ref 70–99)
Potassium: 4.1 mmol/L (ref 3.5–5.1)
Sodium: 136 mmol/L (ref 135–145)

## 2021-12-02 MED ORDER — FERROUS SULFATE 325 (65 FE) MG PO TABS
325.0000 mg | ORAL_TABLET | ORAL | Status: DC
  Administered 2021-12-02: 325 mg via ORAL
  Filled 2021-12-02: qty 1

## 2021-12-02 MED ORDER — EMPAGLIFLOZIN 10 MG PO TABS
10.0000 mg | ORAL_TABLET | Freq: Every day | ORAL | Status: DC
  Administered 2021-12-02 – 2021-12-03 (×2): 10 mg via ORAL
  Filled 2021-12-02 (×2): qty 1

## 2021-12-02 NOTE — Progress Notes (Signed)
Family Medicine Teaching Service Daily Progress Note Intern Pager: [phone]  Patient name: Alice Hays Medical record number: [id number] Date of birth: 12/05/1980 Age: 41 y.o. Gender: female  Primary Care Provider: Pcp, No Consultants: Advanced HF  Code Status: FULL CODE   Pt Overview and Major Events to Date:  2/1: Admitted 2/3: Right and Left heart cath  Assessment and Plan: Alice Hays is a 41 y.o. female who presented with SOB and swelling, found to have new-onset heart failure. PMHx is significant for HTN, iron-deficiency anemia, multiple miscarriages.   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 She is off diuretics and appears euvolemic. BMP stable. Net negative 3L UOP since admission, weight is stable from admission.  -Advanced heart failure team following, appreciate care and recommendations -Continue Entresto 49/51 BID, spironolactone 25 mg daily, coreg 3.125 mg BID -Start Jardiance 10 mg daily  -Plan for cardiac MRI on 2/6 to assess for infiltrative disease/myocarditis   Hypertension: Chronic, poorly controlled BP have significantly improved, ranging 120-140 systolic.   -Monitor vitals per floor protocol -Continue carvedilol 3.125 mg twice daily  -Continue Entresto 49/51BID -Continue spironolactone 25 mg daily  Anterior Mediastinal Mass Incidental finding on CTA of chest. Measures approximately 1.5-1.7 cm at greatest area of thickness. TSH within normal limits.  -Plan for CTVS outpatient follow up   History of Multiple Miscarriages  Has had 3 miscarriages and there was concern for potential hypercoagulable state contributing. Lupus anticoagulant panel returned within normal limits. Anti-dsDNA antibody <1, ANA negative. Thus far only C3 complement elevated. Should also consider history of recurrent STI/PID, endometriosis, PCOS, fibroids, or other  anatomic abnormalities that could be contributing. -Beta-2-microglobulin, cardiolipin antibodies, however, doubt will be positive as other labs have been unremarkable   Iron-deficiency Anemia  Ferritin low at 6, received IV ferriheme on 2/2. Hgb 11.7, stable.  -F/u ferritin level outpatient in about 6 weeks -Start ferrous sulfate every other day -Continue daily MVI -Monitor clinically for signs of bleeding/worsening anemia  Left Hemi-Liver Cyst  Incidental finding, likely a benign cyst. -Monitor clinically, no further work-up at this time.   FEN/GI: Heart Healthy  PPx: Lovenox Dispo:Home pending clinical improvement . Barriers include cMRI, further work-up.   Subjective:  Patient feels well this morning. Has no complaints. Notes urine output is not as much. Last BM yesterday. Hopeful for discharge after cardiac MRI.  Objective: Temp:  [98.3 F (36.8 C)-98.8 F (37.1 C)] 98.3 F (36.8 C) (02/05 0343) Pulse Rate:  [82-85] 82 (02/05 0343) Resp:  [18] 18 (02/04 1103) BP: (115-143)/(78-98) 115/78 (02/05 0343) SpO2:  [95 %] 95 % (02/05 0343) Weight:  [77.8 kg] 77.8 kg (02/05 0343) Physical Exam: General: Alert, pleasant, cooperative, in NAD Cardiovascular: RRR without murmur, 2+ radial, PT and DP pulses b/l Respiratory: CTAB without wheezing/rhonchi/rales Abdomen: Soft, non-tender in all quadrants, no rebound or guarding Extremities: No edema, warm and dry  Laboratory: Recent Labs  Lab 11/30/21 0146 11/30/21 1328 12/01/21 0430 12/02/21 0330  WBC 7.8  --  7.4 6.4  HGB 10.5* 13.6   13.6 11.5* 11.7*  HCT 36.0 40.0   40.0 39.0 39.6  PLT 335  --  347 378   Recent Labs  Lab 11/28/21 1617 11/28/21 1732 11/29/21 0537 11/30/21 0146 11/30/21 1328 12/01/21 0430 12/02/21 0330  NA 136   < > 137 137 138   138 135 136  K 4.0   < >  4.5 3.2* 3.6   3.5 3.6 4.1  CL 103  --  107 101  --  103 105  CO2 25  --  23 25  --  24 24  BUN 13  --  10 12  --  14 12  CREATININE 0.84  --   0.80 0.94  --  0.99 0.87  CALCIUM 8.5*  --  8.3* 8.8*  --  8.8* 8.5*  PROT 5.8*  --  6.0*  --   --   --   --   BILITOT 0.5  --  1.1  --   --   --   --   ALKPHOS 66  --  57  --   --   --   --   ALT 50*  --  47*  --   --   --   --   AST 37  --  45*  --   --   --   --   GLUCOSE 109*  --  103* 107*  --  101* 109*   < > = values in this interval not displayed.     Imaging/Diagnostic Tests: No results found.   Sabino Dick, DO 12/02/2021, 6:19 AM PGY-2, [place] FPTS Intern pager: [phone], text pages welcome

## 2021-12-02 NOTE — Progress Notes (Signed)
Patient ID: Alice Hays, female   DOB: 02/17/1981, 41 y.o.   MRN: [id number]     Advanced Heart Failure Rounding Note  PCP-Cardiologist: None   Subjective:    No complaints today, no dyspnea.    Objective:   Weight Range: 77.8 kg Body mass index is 35.84 kg/m.   Vital Signs:   Temp:  [98.3 F (36.8 C)-98.8 F (37.1 C)] 98.3 F (36.8 C) (02/05 0343) Pulse Rate:  [82-85] 82 (02/05 0343) Resp:  [18] 18 (02/04 1103) BP: (115-143)/(78-98) 115/78 (02/05 0343) SpO2:  [95 %] 95 % (02/05 0343) Weight:  [77.8 kg] 77.8 kg (02/05 0343)    Weight change: Filed Weights   11/30/21 0354 12/01/21 0352 12/02/21 0343  Weight: 76.3 kg 76.3 kg 77.8 kg    Intake/Output:   Intake/Output Summary (Last 24 hours) at 12/02/2021 0824 Last data filed at 12/02/2021 0345 Gross per 24 hour  Intake 834 ml  Output 1400 ml  Net -566 ml      Physical Exam    General: NAD Neck: No JVD, no thyromegaly or thyroid nodule.  Lungs: Clear to auscultation bilaterally with normal respiratory effort. CV: Nondisplaced PMI.  Heart regular S1/S2, no S3/S4, no murmur.  No peripheral edema.   Abdomen: Soft, nontender, no hepatosplenomegaly, no distention.  Skin: Intact without lesions or rashes.  Neurologic: Alert and oriented x 3.  Psych: Normal affect. Extremities: No clubbing or cyanosis.  HEENT: Normal.   Telemetry   SR 80s Personally reviewed  Labs    CBC Recent Labs    12/01/21 0430 12/02/21 0330  WBC 7.4 6.4  HGB 11.5* 11.7*  HCT 39.0 39.6  MCV 75.4* 76.0*  PLT 347 378   Basic Metabolic Panel Recent Labs    60/63/01 0430 12/02/21 0330  NA 135 136  K 3.6 4.1  CL 103 105  CO2 24 24  GLUCOSE 101* 109*  BUN 14 12  CREATININE 0.99 0.87  CALCIUM 8.8* 8.5*   Liver Function Tests No results for input(s): AST, ALT, ALKPHOS, BILITOT, PROT, ALBUMIN in the last 72 hours. No results for input(s): LIPASE, AMYLASE in the last 72 hours. Cardiac Enzymes No results for input(s):  CKTOTAL, CKMB, CKMBINDEX, TROPONINI in the last 72 hours.  BNP: BNP (last 3 results) Recent Labs    11/28/21 1748  BNP 1,267.7*    ProBNP (last 3 results) No results for input(s): PROBNP in the last 8760 hours.   D-Dimer No results for input(s): DDIMER in the last 72 hours. Hemoglobin A1C No results for input(s): HGBA1C in the last 72 hours. Fasting Lipid Panel No results for input(s): CHOL, HDL, LDLCALC, TRIG, CHOLHDL, LDLDIRECT in the last 72 hours. Thyroid Function Tests No results for input(s): TSH, T4TOTAL, T3FREE, THYROIDAB in the last 72 hours.  Invalid input(s): FREET3  Other results:   Imaging    No results found.   Medications:     Scheduled Medications:  carvedilol  3.125 mg Oral BID WC   empagliflozin  10 mg Oral Daily   enoxaparin (LOVENOX) injection  40 mg Subcutaneous Q24H   ferrous sulfate  325 mg Oral QODAY   multivitamin with minerals  1 tablet Oral Daily   sacubitril-valsartan  1 tablet Oral BID   sodium chloride flush  3 mL Intravenous Q12H   spironolactone  25 mg Oral Daily    Infusions:  sodium chloride      PRN Medications: sodium chloride, acetaminophen, albuterol, ondansetron (ZOFRAN) IV, sodium chloride flush  Patient Profile  41 y.o. female with history of HTN and multiple prior miscarriages. Admitted with acute respiratory failure with hypoxia and new systolic CHF.   Assessment/Plan   1. Acute systolic CHF: Echo this admission with EF 40-45% range with mild LV dilation and mild LVH, moderate MR, moderate LAE, mildly decreased RV systolic function.  She has uncontrolled HTN as a major risk factor.  HIV and ANA negative and TSH normal.  No ETOH or drug history with negative UDS. Spontaneous miscarriages concerning for antiphospholipid antibody syndrome, but when this affects the heart, it generally leads to valvular problems and not cardiomyopathy. No significant coronary disease on cath and filling pressures now normal on RHC  after diuresis with normal cardiac output. She is euvolemic on exam, breathing is back to normal.  - She does not appear to need a standing diuretic.   - Continue Entresto 49/51 bid. - Continue spironolactone 25 mg daily.  - Continue Coreg 3.125 mg bid.  - Add Jardiance 10 mg daily today. - Needs cardiac MRI to assess for infiltrative disease/myocarditis. Can do as inpatient or outpatient (would have to be tomorrow if she stays in hospital for it).  2.  HTN: Uncontrolled. May be cause of cardiomyopathy.  Improving with cardiac meds.  3.  H/o multiple miscarriages/spontaneous abortions: ?Antiphospholipid antibody syndrome.  - Serologic workup for APLAS negative . 4. 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  5. 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  Would evaluate for signs of myasthenia gravis (per primary team).  Will need longitudinal followup of this, TCTS plans to followup as outpatient.   We will arrange CHF clinic followup.  She will need cardiac MRI, can be done inpatient or outpatient (if waits for inpatient study, cannot be done until tomorrow).   Marca Ancona 12/02/2021 8:28 AM

## 2021-12-02 NOTE — Progress Notes (Incomplete)
Family Medicine Teaching Service Daily Progress Note Intern Pager: [phone] ***date/refresh Patient name: Alice Hays Medical record number: [id number] Date of birth: 03-Nov-1980 Age: 41 y.o. Gender: female  Primary Care Provider: Pcp, No Consultants: HF  Code Status: Full  Pt Overview and Major Events to Date:  2/1 admitted 2/3 R and L Heart Cath  Assessment and Plan:  Alice Hays is a 41 yo female who presented with SOB and LE swelling found to have new onset HF. PMH significant for HTN, IDA, multiple miscarriages.  Acute Onset HFmrEF EF 40 to 45% Urine output is***.  Weight today is***.  BMP showed*** -Heart failure team following, appreciate care and recommendations -Entresto 49/51 twice daily -spironolactone 25 mg daily -Coreg 3.125 twice daily -jardiance 10 mh daily -cMRI today  HTN Blood pressure ranges have been***.-Monitor blood pressures -Carvedilol 3.125 mg twice daily -Entresto 49/51 twice daily -Spironolactone 25 mg daily  Anterior mediastinal mass Finding on CTA chest. TSH was within normal limits. SLE labs resulted within normal limits. -CVTS follow-up outpatient  Multiple miscarriages Beta-2 microglobulin, cardiolipin, lupus anticoagulant labs all returned negative.  Consider other causes such as anatomical/PID. -Outpatient follow-up  Iron deficiency anemia  Hemoglobin was*** -Follow-up ferritin level outpatient in 6 weeks -Ferrous sulfate every other day -MVI -Monitor on CBC  Left hemiliver cyst Likely benign. -Monitor clinically  FEN/GI: Heart healthy PPx: Lovenox Dispo: Home pending cMRI  Subjective:  ***  Objective: Temp:  [97.8 F (36.6 C)-98.3 F (36.8 C)] 98.2 F (36.8 C) (02/05 1614) Pulse Rate:  [82-89] 86 (02/05 1614) Resp:  [16-17] 17 (02/05 1931) BP: (115-139)/(78-95) 139/95 (02/05 1931) SpO2:  [95 %-100 %] 100 % (02/05 1931) Weight:  [77.8 kg] 77.8 kg (02/05 0343) Physical Exam: General: *** Cardiovascular:  *** Respiratory: *** Abdomen: *** Extremities: ***  Laboratory: Recent Labs  Lab 11/30/21 0146 11/30/21 1328 12/01/21 0430 12/02/21 0330  WBC 7.8  --  7.4 6.4  HGB 10.5* 13.6   13.6 11.5* 11.7*  HCT 36.0 40.0   40.0 39.0 39.6  PLT 335  --  347 378   Recent Labs  Lab 11/28/21 1617 11/28/21 1732 11/29/21 0537 11/30/21 0146 11/30/21 1328 12/01/21 0430 12/02/21 0330  NA 136   < > 137 137 138   138 135 136  K 4.0   < > 4.5 3.2* 3.6   3.5 3.6 4.1  CL 103  --  107 101  --  103 105  CO2 25  --  23 25  --  24 24  BUN 13  --  10 12  --  14 12  CREATININE 0.84  --  0.80 0.94  --  0.99 0.87  CALCIUM 8.5*  --  8.3* 8.8*  --  8.8* 8.5*  PROT 5.8*  --  6.0*  --   --   --   --   BILITOT 0.5  --  1.1  --   --   --   --   ALKPHOS 66  --  57  --   --   --   --   ALT 50*  --  47*  --   --   --   --   AST 37  --  45*  --   --   --   --   GLUCOSE 109*  --  103* 107*  --  101* 109*   < > = values in this interval not displayed.    Imaging/Diagnostic Tests: No results found.  Levin Erp, MD 12/02/2021, 8:05 PM PGY-1, [facility] FPTS Intern pager: [phone], text pages welcome

## 2021-12-03 ENCOUNTER — Other Ambulatory Visit (HOSPITAL_COMMUNITY): Payer: Self-pay

## 2021-12-03 ENCOUNTER — Inpatient Hospital Stay (HOSPITAL_COMMUNITY): Payer: BC Managed Care – PPO

## 2021-12-03 ENCOUNTER — Encounter (HOSPITAL_COMMUNITY): Payer: Self-pay | Admitting: Cardiology

## 2021-12-03 DIAGNOSIS — I5021 Acute systolic (congestive) heart failure: Secondary | ICD-10-CM

## 2021-12-03 DIAGNOSIS — K7689 Other specified diseases of liver: Secondary | ICD-10-CM

## 2021-12-03 LAB — CBC
HCT: 37.5 % (ref 36.0–46.0)
Hemoglobin: 10.6 g/dL — ABNORMAL LOW (ref 12.0–15.0)
MCH: 21.7 pg — ABNORMAL LOW (ref 26.0–34.0)
MCHC: 28.3 g/dL — ABNORMAL LOW (ref 30.0–36.0)
MCV: 76.8 fL — ABNORMAL LOW (ref 80.0–100.0)
Platelets: 346 10*3/uL (ref 150–400)
RBC: 4.88 MIL/uL (ref 3.87–5.11)
RDW: 20.4 % — ABNORMAL HIGH (ref 11.5–15.5)
WBC: 7.3 10*3/uL (ref 4.0–10.5)
nRBC: 0.5 % — ABNORMAL HIGH (ref 0.0–0.2)

## 2021-12-03 LAB — BASIC METABOLIC PANEL
Anion gap: 7 (ref 5–15)
BUN: 18 mg/dL (ref 6–20)
CO2: 25 mmol/L (ref 22–32)
Calcium: 8.4 mg/dL — ABNORMAL LOW (ref 8.9–10.3)
Chloride: 102 mmol/L (ref 98–111)
Creatinine, Ser: 1.02 mg/dL — ABNORMAL HIGH (ref 0.44–1.00)
GFR, Estimated: >60 mL/min (ref >60)
Glucose, Bld: 102 mg/dL — ABNORMAL HIGH (ref 70–99)
Potassium: 4.2 mmol/L (ref 3.5–5.1)
Sodium: 134 mmol/L — ABNORMAL LOW (ref 135–145)

## 2021-12-03 MED ORDER — GADOBUTROL 1 MMOL/ML IV SOLN
8.0000 mL | Freq: Once | INTRAVENOUS | Status: AC | PRN
  Administered 2021-12-03: 8 mL via INTRAVENOUS

## 2021-12-03 MED ORDER — SPIRONOLACTONE 25 MG PO TABS
25.0000 mg | ORAL_TABLET | Freq: Every day | ORAL | 0 refills | Status: DC
  Filled 2021-12-03: qty 30, 30d supply, fill #0

## 2021-12-03 MED ORDER — FERROUS SULFATE 325 (65 FE) MG PO TABS
325.0000 mg | ORAL_TABLET | ORAL | 0 refills | Status: AC
Start: 2021-12-04 — End: ?
  Filled 2021-12-03: qty 30, 60d supply, fill #0

## 2021-12-03 MED ORDER — CARVEDILOL 6.25 MG PO TABS
6.2500 mg | ORAL_TABLET | Freq: Two times a day (BID) | ORAL | 0 refills | Status: DC
  Filled 2021-12-03: qty 60, 30d supply, fill #0

## 2021-12-03 MED ORDER — EMPAGLIFLOZIN 10 MG PO TABS
10.0000 mg | ORAL_TABLET | Freq: Every day | ORAL | 0 refills | Status: DC
  Filled 2021-12-03: qty 30, 30d supply, fill #0

## 2021-12-03 MED ORDER — SACUBITRIL-VALSARTAN 49-51 MG PO TABS
1.0000 | ORAL_TABLET | Freq: Two times a day (BID) | ORAL | 0 refills | Status: DC
  Filled 2021-12-03: qty 60, 30d supply, fill #0

## 2021-12-03 MED ORDER — CARVEDILOL 6.25 MG PO TABS
6.2500 mg | ORAL_TABLET | Freq: Two times a day (BID) | ORAL | Status: DC
  Administered 2021-12-03: 6.25 mg via ORAL
  Filled 2021-12-03: qty 1

## 2021-12-03 MED ORDER — DAPAGLIFLOZIN PROPANEDIOL 10 MG PO TABS
10.0000 mg | ORAL_TABLET | Freq: Every day | ORAL | 0 refills | Status: DC
  Filled 2021-12-03: qty 30, 30d supply, fill #0

## 2021-12-03 NOTE — Discharge Summary (Signed)
Family Medicine Teaching [facility] Discharge Summary  Patient name: Alice Hays Medical record number: [id number] Date of birth: 12-Nov-1980 Age: 41 y.o. Gender: female Date of Admission: 11/28/2021  Date of Discharge: 12/03/2021 Admitting Physician: Levin Erp, MD  Primary Care Provider: Pcp, No Consultants: Advanced Heart Failure  Indication for Hospitalization: Acute onset heart failure  Discharge Diagnoses/Problem List:  Primary HTN Acute Congestive Heart Failure Mediastinal Mass Cardiomyopathy Liver cyst Acute systolic CHF  Disposition: Home  Discharge Condition: Stable  Discharge Exam:  General: NAD, awake, alert, responsive to questions Head: Normocephalic atraumatic CV: Regular rate and rhythm no murmurs rubs or gallops Respiratory: Clear to ausculation bilaterally, no wheezes rales or crackles, chest rises symmetrically,  no increased work of breathing Abdomen: Soft, non-tender, non-distended, normoactive bowel sounds  Extremities: Moves upper and lower extremities freely, no edema in LE Neuro: No focal deficits Skin: No rashes or lesions visualized   Brief Hospital Course:  Alice Hays is a 41 year old female who presented with acute shortness of breath and lower extremity swelling found to have new onset heart failure.  Past medical history significant for hypertension, IDA, multiple miscarriages.  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  Given age of onset and thymic hyperplasia that was worked up for possible autoimmune disorder/SLE/antiphospholipid syndrome. ANA, lupus anticoagulant panel, dsDNA, beta-2 microglobulin, cardiolipin antibodies all negative.  HIV and TSH were normal.   She denied any alcohol or drug use with a negative UDS.  Did have uncontrolled hypertension as a major risk factor. Advanced heart failure team was consulted and echocardiogram was performed which showed EF of 40 to 45%, mild LV dilation and mild LVH with moderate MR and moderate LAE and mildly decreased RV systolic function.  Right and left heart cath were performed and did not show significant coronary disease, nonischemic cardiomyopathy, low filling pressures.  Cardiac MRI was performed which showed normal LV size with mild LV hypertrophy, EF of 31% with diffuse hypokinesis, normal RV size with mild systolic dysfunction and EF of 38%, and no definite LGE noted.  Was diuresed net 4.4 L during hospitalization. Weight at end of hospitalization was 173.8 pounds.  Was discharged on Coreg 6.25 mg twice daily, Entresto 49/51 twice daily, spironolactone 25 daily, and Farxiga 10 mg daily with close follow-up in CHF clinic.  Hypertension, Poorly Controlled Blood pressure ranges initially to 200s over 140s.  Did not have a strict home regimen before.  Was controlled and discharged on carvedilol 6.25 mg twice daily, Entresto 49-51 twice daily.  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 Possibly represents a small amount of thymic tissue and may represent thymic hyperplasia. Myasthenia gravis on differential however no bulbar weakness on examinations. SLE labs negative. CT surgery will follow up outpatient.  Multiple miscarriages Has had 2 ectopic pregnancies and 3 spontaneous abortions. Antiphospholipid labs were negative. Can also consider history of recurrent STI/PID, endometriosis, PCOS, fibroids, or other anatomic abnormalities that could be contributing.  Iron-deficiency Anemia  Ferritin low at 6, received IV ferriheme on 2/2. Hgb remained stable. Ferrous sulfate started in hospital.  Left  Hemi-Liver Cyst  Incidental finding on CTA and likely a benign cyst.  Did not have any abdominal pain.   Issues for Follow Up:  Given mediastinal mass, patient will follow up with cardiothoracic surgery. Please ensure patient maintains CVTS follow up. Imaging notable for thymic hyperplasia with possible associated to cardiac dysfunction, recommended to repeat CT or MRI in 3 months to ensure resolution. ' Imaging demonstrated liver cyst, repeat CMP at PCP follow up to ensure no associated transaminitis.  Repeat ferritin level outpatient in 6 weeks Recommend repeat BMP to look at electrolytes at PCP visit  Significant Procedures: Right and Left Heart Cath  Significant Labs and Imaging:  Recent Labs  Lab 12/01/21 0430 12/02/21 0330 12/03/21 0238  WBC 7.4 6.4 7.3  HGB 11.5* 11.7* 10.6*  HCT 39.0 39.6 37.5  PLT 347 378 346   Recent Labs  Lab 11/28/21 1617 11/28/21 1732 11/29/21 0537 11/30/21 0146 11/30/21 1328 12/01/21 0430 12/02/21 0330 12/03/21 0238  NA 136   < > 137 137 138   138 135 136 134*  K 4.0   < > 4.5 3.2* 3.6   3.5 3.6 4.1 4.2  CL 103  --  107 101  --  103 105 102  CO2 25  --  23 25  --  24 24 25   GLUCOSE 109*  --  103* 107*  --  101* 109* 102*  BUN 13  --  10 12  --  14 12 18   CREATININE 0.84  --  0.80 0.94  --  0.99 0.87 1.02*  CALCIUM 8.5*  --  8.3* 8.8*  --  8.8* 8.5* 8.4*  ALKPHOS 66  --  57  --   --   --   --   --   AST 37  --  45*  --   --   --   --   --   ALT 50*  --  47*  --   --   --   --   --   ALBUMIN 3.1*  --  3.1*  --   --   --   --   --    < > = values in this interval not displayed.     Results/Tests Pending at Time of Discharge:   Discharge Medications:  Allergies as of 12/03/2021       Reactions   Shellfish Allergy Anaphylaxis        Medication List     STOP taking these medications    cyclobenzaprine 5 MG tablet Commonly known as: FLEXERIL   hydrochlorothiazide 25 MG tablet Commonly known as: HYDRODIURIL    medroxyPROGESTERone 150 MG/ML injection Commonly known as: DEPO-PROVERA   naproxen 375 MG tablet Commonly known as: NAPROSYN   PRESCRIPTION MEDICATION       TAKE these medications    carvedilol 6.25 MG tablet Commonly known as: COREG Take 1 tablet (6.25 mg total) by mouth 2 (two) times daily with a meal.   Entresto 49-51 MG Generic drug: sacubitril-valsartan Take 1 tablet by mouth 2 (two) times daily.   Farxiga 10 MG Tabs tablet Generic drug: dapagliflozin propanediol Take 1 tablet (10 mg total) by mouth daily before breakfast.   FeroSul 325 (65 FE) MG tablet Generic drug: ferrous sulfate Take 1 tablet (325 mg total) by mouth every other day. Start taking on: December 04, 2021   MV-ONE PO Take 1 capsule by mouth every morning.   spironolactone 25 MG tablet Commonly known as: ALDACTONE Take 1 tablet (25 mg total) by mouth daily. Start taking on: December 04, 2021 °  ° °  ° ° °Discharge Instructions: Please refer to Patient Instructions section of EMR for full details.  Patient was counseled important signs and symptoms that should prompt return to medical care, changes in medications, dietary instructions, activity restrictions, and follow up appointments.  ° °Follow-Up Appointments: ° Follow-up Information   ° ° [facility]. Go on 12/07/2021.   °Specialty: Internal Medicine °Why: @10:00am °Contact information: °[address] °[place] [address] °[phone] ° °  °  ° ° [facility] Follow up on 12/07/2021.   °Specialty: Cardiology °Why: at 3: 30. Bring all your medicaitons °Contact information: °[address] °[id number] °[place] [address] [phone] ° °  °  ° °  °  ° °  ° ° °Esty Ahuja, MD °12/03/2021, 3:30 PM °PGY-1, [facility]  °

## 2021-12-03 NOTE — Progress Notes (Addendum)
Patient ID: Alice Hays, female   DOB: Jan 24, 1981, 41 y.o.   MRN: [id number]     Advanced Heart Failure Rounding Note  PCP-Cardiologist: None   Subjective:    Wt stable. BP better controlled. No complaints today. Getting cMRI.    Objective:   Weight Range: 78.8 kg Body mass index is 36.32 kg/m.   Vital Signs:   Temp:  [97.5 F (36.4 C)-98.2 F (36.8 C)] 98.2 F (36.8 C) (02/06 0408) Pulse Rate:  [82-86] 86 (02/05 1614) Resp:  [16-17] 17 (02/06 0408) BP: (126-139)/(81-95) 126/81 (02/06 0408) SpO2:  [96 %-100 %] 100 % (02/05 1931) Weight:  [78.8 kg] 78.8 kg (02/06 0408) Last BM Date: 12/02/21  Weight change: Filed Weights   12/01/21 0352 12/02/21 0343 12/03/21 0408  Weight: 76.3 kg 77.8 kg 78.8 kg    Intake/Output:   Intake/Output Summary (Last 24 hours) at 12/03/2021 0859 Last data filed at 12/03/2021 0658 Gross per 24 hour  Intake 843 ml  Output 2450 ml  Net -1607 ml      Physical Exam    General:  Well appearing. No respiratory difficulty HEENT: normal Neck: supple. no JVD. Carotids 2+ bilat; no bruits. No lymphadenopathy or thyromegaly appreciated. Cor: PMI nondisplaced. Regular rate & rhythm. No rubs, gallops or murmurs. Lungs: clear Abdomen: soft, nontender, nondistended. No hepatosplenomegaly. No bruits or masses. Good bowel sounds. Extremities: no cyanosis, clubbing, rash, edema Neuro: alert & oriented x 3, cranial nerves grossly intact. moves all 4 extremities w/o difficulty. Affect pleasant.  Telemetry   SR 80s Personally reviewed  Labs    CBC Recent Labs    12/02/21 0330 12/03/21 0238  WBC 6.4 7.3  HGB 11.7* 10.6*  HCT 39.6 37.5  MCV 76.0* 76.8*  PLT 378 346   Basic Metabolic Panel Recent Labs    33/82/50 0330 12/03/21 0238  NA 136 134*  K 4.1 4.2  CL 105 102  CO2 24 25  GLUCOSE 109* 102*  BUN 12 18  CREATININE 0.87 1.02*  CALCIUM 8.5* 8.4*   Liver Function Tests No results for input(s): AST, ALT, ALKPHOS, BILITOT,  PROT, ALBUMIN in the last 72 hours. No results for input(s): LIPASE, AMYLASE in the last 72 hours. Cardiac Enzymes No results for input(s): CKTOTAL, CKMB, CKMBINDEX, TROPONINI in the last 72 hours.  BNP: BNP (last 3 results) Recent Labs    11/28/21 1748  BNP 1,267.7*    ProBNP (last 3 results) No results for input(s): PROBNP in the last 8760 hours.   D-Dimer No results for input(s): DDIMER in the last 72 hours. Hemoglobin A1C No results for input(s): HGBA1C in the last 72 hours. Fasting Lipid Panel No results for input(s): CHOL, HDL, LDLCALC, TRIG, CHOLHDL, LDLDIRECT in the last 72 hours. Thyroid Function Tests No results for input(s): TSH, T4TOTAL, T3FREE, THYROIDAB in the last 72 hours.  Invalid input(s): FREET3  Other results:   Imaging    No results found.   Medications:     Scheduled Medications:  carvedilol  6.25 mg Oral BID WC   empagliflozin  10 mg Oral Daily   enoxaparin (LOVENOX) injection  40 mg Subcutaneous Q24H   ferrous sulfate  325 mg Oral QODAY   multivitamin with minerals  1 tablet Oral Daily   sacubitril-valsartan  1 tablet Oral BID   sodium chloride flush  3 mL Intravenous Q12H   spironolactone  25 mg Oral Daily    Infusions:  sodium chloride      PRN Medications: sodium  chloride, acetaminophen, albuterol, ondansetron (ZOFRAN) IV, sodium chloride flush    Patient Profile  42 y.o. female with history of HTN and multiple prior miscarriages. Admitted with acute respiratory failure with hypoxia and new systolic CHF.   Assessment/Plan   1. Acute systolic CHF: Echo this admission with EF 40-45% range with mild LV dilation and mild LVH, moderate MR, moderate LAE, mildly decreased RV systolic function.  She has uncontrolled HTN is a major risk factor.  HIV and ANA negative and TSH normal.  No ETOH or drug history with negative UDS. Spontaneous miscarriages concerning for antiphospholipid antibody syndrome, but when this affects the heart,  it generally leads to valvular problems and not cardiomyopathy. No strong FH of CMP.  No significant coronary disease on cath and filling pressures now normal on RHC after diuresis with normal cardiac output. She is euvolemic on exam, breathing is back to normal.  - She does not appear to need a standing diuretic.   - Continue Entresto 49/51 bid. - Continue spironolactone 25 mg daily.  - Increase Coreg 6.25 mg bid.  - Continue Jardiance 10 mg daily - Plan cardiac MRI today to assess for infiltrative disease/myocarditis.  2.  HTN: Uncontrolled. May be cause of cardiomyopathy.  Improving with cardiac meds.  3.  H/o multiple miscarriages/spontaneous abortions: ?Antiphospholipid antibody syndrome.  - Serologic workup for APLAS negative . 4. 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  5. 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  Would evaluate for signs of myasthenia gravis (per primary team).  Will need longitudinal followup of this, TCTS plans to followup as outpatient.    Robbie Lis, PA-C  12/03/2021 8:59 AM  Patient seen with PA, agree with the above note.   She had cardiac MRI today, results as below.  No complaints today, no dyspnea.   Cardiac MRI:  1. Normal LV size with mild LV hypertrophy. EF 31%, diffuse hypokinesis. 2.  Normal RV size with mild systolic dysfunction, EF 38%. 3. Delayed enhancement images difficult due to recent Feraheme, but no definite LGE noted.  General: NAD Neck: No JVD, no thyromegaly or thyroid nodule.  Lungs: Clear to auscultation bilaterally with normal respiratory effort. CV: Nondisplaced PMI.  Heart regular S1/S2, no S3/S4, no murmur.  No peripheral edema.   Abdomen: Soft, nontender, no hepatosplenomegaly, no distention.  Skin: Intact without lesions or rashes.  Neurologic: Alert and oriented x 3.  Psych: Normal affect. Extremities: No clubbing or cyanosis.  HEENT: Normal.   Patient is stable today.  Will increase Coreg to 6.25 mg bid.    Cardiac MRI was difficult due to recent Feraheme receipt, but no definite LGE identified.  Her cardiomyopathy could be due to uncontrolled HTN for a long period of time.   She can go home today.  She will need close followup in CHF clinic.  I think staying out of work this week is reasonable.  Cardiac meds for discharge: Entresto 49/51 bid, spironolactone 25 daily, Coreg 6.25 mg bid, Jardiance 10 mg daily.   Marca Ancona 12/03/2021 10:26 AM

## 2021-12-03 NOTE — H&P (Deleted)
Family Medicine Teaching [facility] Discharge Summary  Patient name: Alice Hays Medical record number: [id number] Date of birth: 12-Nov-1980 Age: 41 y.o. Gender: female Date of Admission: 11/28/2021  Date of Discharge: 12/03/2021 Admitting Physician: Levin Erp, MD  Primary Care Provider: Pcp, No Consultants: Advanced Heart Failure  Indication for Hospitalization: Acute onset heart failure  Discharge Diagnoses/Problem List:  Primary HTN Acute Congestive Heart Failure Mediastinal Mass Cardiomyopathy Liver cyst Acute systolic CHF  Disposition: Home  Discharge Condition: Stable  Discharge Exam:  General: NAD, awake, alert, responsive to questions Head: Normocephalic atraumatic CV: Regular rate and rhythm no murmurs rubs or gallops Respiratory: Clear to ausculation bilaterally, no wheezes rales or crackles, chest rises symmetrically,  no increased work of breathing Abdomen: Soft, non-tender, non-distended, normoactive bowel sounds  Extremities: Moves upper and lower extremities freely, no edema in LE Neuro: No focal deficits Skin: No rashes or lesions visualized   Brief Hospital Course:  Alice Hays is a 41 year old female who presented with acute shortness of breath and lower extremity swelling found to have new onset heart failure.  Past medical history significant for hypertension, IDA, multiple miscarriages.  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  Given age of onset and thymic hyperplasia that was worked up for possible autoimmune disorder/SLE/antiphospholipid syndrome. ANA, lupus anticoagulant panel, dsDNA, beta-2 microglobulin, cardiolipin antibodies all negative.  HIV and TSH were normal.   She denied any alcohol or drug use with a negative UDS.  Did have uncontrolled hypertension as a major risk factor. Advanced heart failure team was consulted and echocardiogram was performed which showed EF of 40 to 45%, mild LV dilation and mild LVH with moderate MR and moderate LAE and mildly decreased RV systolic function.  Right and left heart cath were performed and did not show significant coronary disease, nonischemic cardiomyopathy, low filling pressures.  Cardiac MRI was performed which showed normal LV size with mild LV hypertrophy, EF of 31% with diffuse hypokinesis, normal RV size with mild systolic dysfunction and EF of 38%, and no definite LGE noted.  Was diuresed net 4.4 L during hospitalization. Weight at end of hospitalization was 173.8 pounds.  Was discharged on Coreg 6.25 mg twice daily, Entresto 49/51 twice daily, spironolactone 25 daily, and Farxiga 10 mg daily with close follow-up in CHF clinic.  Hypertension, Poorly Controlled Blood pressure ranges initially to 200s over 140s.  Did not have a strict home regimen before.  Was controlled and discharged on carvedilol 6.25 mg twice daily, Entresto 49-51 twice daily.  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 Possibly represents a small amount of thymic tissue and may represent thymic hyperplasia. Myasthenia gravis on differential however no bulbar weakness on examinations. SLE labs negative. CT surgery will follow up outpatient.  Multiple miscarriages Has had 2 ectopic pregnancies and 3 spontaneous abortions. Antiphospholipid labs were negative. Can also consider history of recurrent STI/PID, endometriosis, PCOS, fibroids, or other anatomic abnormalities that could be contributing.  Iron-deficiency Anemia  Ferritin low at 6, received IV ferriheme on 2/2. Hgb remained stable. Ferrous sulfate started in hospital.  Left  Hemi-Liver Cyst  Incidental finding on CTA and likely a benign cyst.  Did not have any abdominal pain.   Issues for Follow Up:  Given mediastinal mass, patient will follow up with cardiothoracic surgery. Please ensure patient maintains CVTS follow up. Imaging notable for thymic hyperplasia with possible associated to cardiac dysfunction, recommended to repeat CT or MRI in 3 months to ensure resolution. ' Imaging demonstrated liver cyst, repeat CMP at PCP follow up to ensure no associated transaminitis.  Repeat ferritin level outpatient in 6 weeks Recommend repeat BMP to look at electrolytes at PCP visit  Significant Procedures: Right and Left Heart Cath  Significant Labs and Imaging:  Recent Labs  Lab 12/01/21 0430 12/02/21 0330 12/03/21 0238  WBC 7.4 6.4 7.3  HGB 11.5* 11.7* 10.6*  HCT 39.0 39.6 37.5  PLT 347 378 346   Recent Labs  Lab 11/28/21 1617 11/28/21 1732 11/29/21 0537 11/30/21 0146 11/30/21 1328 12/01/21 0430 12/02/21 0330 12/03/21 0238  NA 136   < > 137 137 138   138 135 136 134*  K 4.0   < > 4.5 3.2* 3.6   3.5 3.6 4.1 4.2  CL 103  --  107 101  --  103 105 102  CO2 25  --  23 25  --  24 24 25   GLUCOSE 109*  --  103* 107*  --  101* 109* 102*  BUN 13  --  10 12  --  14 12 18   CREATININE 0.84  --  0.80 0.94  --  0.99 0.87 1.02*  CALCIUM 8.5*  --  8.3* 8.8*  --  8.8* 8.5* 8.4*  ALKPHOS 66  --  57  --   --   --   --   --   AST 37  --  45*  --   --   --   --   --   ALT 50*  --  47*  --   --   --   --   --   ALBUMIN 3.1*  --  3.1*  --   --   --   --   --    < > = values in this interval not displayed.     Results/Tests Pending at Time of Discharge:   Discharge Medications:  Allergies as of 12/03/2021       Reactions   Shellfish Allergy Anaphylaxis        Medication List     STOP taking these medications    cyclobenzaprine 5 MG tablet Commonly known as: FLEXERIL   hydrochlorothiazide 25 MG tablet Commonly known as: HYDRODIURIL    medroxyPROGESTERone 150 MG/ML injection Commonly known as: DEPO-PROVERA   naproxen 375 MG tablet Commonly known as: NAPROSYN   PRESCRIPTION MEDICATION       TAKE these medications    carvedilol 6.25 MG tablet Commonly known as: COREG Take 1 tablet (6.25 mg total) by mouth 2 (two) times daily with a meal.   Entresto 49-51 MG Generic drug: sacubitril-valsartan Take 1 tablet by mouth 2 (two) times daily.   Farxiga 10 MG Tabs tablet Generic drug: dapagliflozin propanediol Take 1 tablet (10 mg total) by mouth daily before breakfast.   FeroSul 325 (65 FE) MG tablet Generic drug: ferrous sulfate Take 1 tablet (325 mg total) by mouth every other day. Start taking on: December 04, 2021   MV-ONE PO Take 1 capsule by mouth every morning.   spironolactone 25 MG tablet Commonly known as: ALDACTONE Take 1 tablet (25 mg total) by mouth daily. Start taking on: December 04, 2021        Discharge Instructions: Please refer to Patient Instructions section of EMR for full details.  Patient was counseled important signs and symptoms that should prompt return to medical care, changes in medications, dietary instructions, activity restrictions, and follow up appointments.   Follow-Up Appointments:  Follow-up Information     [facility]. Go on 12/07/2021.   Specialty: Internal Medicine Why: @10 :00am Contact information: [address] [place] [address] [phone]        [facility] Follow up on 12/07/2021.   Specialty: Cardiology Why: at 3: 30. Bring all your [employer] information: [address] [id number] [id number] [address] [phone]                Levin Erp, MD 12/03/2021, 3:30 PM PGY-1, [facility]

## 2021-12-03 NOTE — Progress Notes (Signed)
Pt has orders to be discharged. Discharge instructions given and pt has no additional questions at this time. Medication regimen reviewed and pt educated. Pt verbalized understanding and has no additional questions. Telemetry box removed. IVs removed and sites in good condition. Pt stable and waiting for transportation. 

## 2021-12-03 NOTE — Discharge Instructions (Addendum)
You were hospitalized at [facility] due to shortness of breath.  We expect this is from a new diagnosis of congestive heart failure which improved after medications.  We are so glad you are feeling better.  Be sure to follow-up with your regularly scheduled  appointments. Please make sure to take all medications as prescribed. Thank you for allowing Alice Hays to be a part of your medical care.  You have the following appointments: -2/10 at 10 am with your primary care doctor -2/10 at 3:30 pm with the cardiologist -3/3 at 9:30 am with the Dr. Cliffton Asters (cardiothoracic doctor)   Take care, [facility] family medicine team

## 2021-12-03 NOTE — Progress Notes (Signed)
FPTS Brief Progress Note  S: Pt sleeping.    O: BP (!) 139/95 (BP Location: Right Arm)    Pulse 86    Temp (!) 97.5 F (36.4 C) (Oral)    Resp 17    Ht 4\' 10"  (1.473 m)    Wt 77.8 kg    LMP 10/07/2021 (Approximate)    SpO2 100%    BMI 35.84 kg/m    GEN: resting comfortably  RESP: equal chest rise and fall  CVS: NSR on bedside monitor    A/P: No changes to plan. Await cMRI (inpt vs outpt). See AM progress note .  - Orders reviewed. Labs for AM ordered, which was adjusted as needed.    14/08/2021, DO 12/03/2021, 12:17 AM PGY-3, [facility] Night Resident  Please page [phone] with questions.

## 2021-12-04 ENCOUNTER — Encounter: Payer: Self-pay | Admitting: Family Medicine

## 2021-12-04 NOTE — Progress Notes (Signed)
ADVANCED HF CLINIC CONSULT NOTE  Primary Care: Bo Merino, NP HF Cardiologist: Dr. Aundra Dubin  HPI: Alice Hays is a 41 y.o.female with history of HTN and multiple miscarriages (2 ectopic pregnancies + 2 spontaneous abortions), and new diagnosis of mediastinal mass and systolic heart failure.    Admitted 0000000 with new systolic HF. She was markedly hypertensive with SBP 213/140. Incidentally noted pulmonary edema, small pleural effusions and nodular appearance anterior mediastinal soft tissue density (? Thymic hyperplasia). CTS consulted regarding mediastinal mass. Echo showed EF 45%, RV mildly reduced, moderate MR. She was diuresed with IV lasix and AHF consulted. Underwent R/LHC showing no significant CAD, NICM, low filling pressures. CMRI showed normal LV size with mild LV hypertrophy, EF of 31% with diffuse hypokinesis, normal RV size with mild systolic dysfunction and EF of 38%, and no definite LGE noted.  She was started on GDMT   Today she returns for post hospital HF follow up. She is mildly dyspneic with ADLs and with exertion. She is fatigued and does snore. Denies palpitations, abnormal bleeding, CP, dizziness, edema, or PND/Orthopnea. Appetite ok. No fever or chills. Weight at home 178 pounds. Taking all medications. Works at [employer]. No ETOH/tobacco/ drug use. She has a 44 year old daughter.   ECG (personally reviewed): NSR 83 bpm, + LVH  Labs (2/23): K 4.2, creatinine 1.02  Cardiac Studies: - Echo (2/23): EF 40-45%, RV mildly reduced, moderate MR.  - R/LHC (2/23): no significant CAD, NICM, low filling pressures, preserved CO. RA mean 1, PA 22/4 (mean 2), CO/CI 5.43/3.21  - cMRI (2/23): Difficult due to recent Feraheme, but normal LV size with mild LV hypertrophy, EF of 31% with diffuse hypokinesis, normal RV size with mild systolic dysfunction and EF of 38%, and no definite LGE noted.   Review of Systems: [y] = yes, [ ]  = no   General: Weight gain [ ] ; Weight loss [ ] ;  Anorexia [ ] ; Fatigue Blue.Reese ]; Fever [ ] ; Chills [ ] ; Weakness [ ]   Cardiac: Chest pain/pressure [ ] ; Resting SOB [ ] ; Exertional SOB [ y]; Orthopnea [ ] ; Pedal Edema [ ] ; Palpitations [ ] ; Syncope [ ] ; Presyncope [ ] ; Paroxysmal nocturnal dyspnea[ ]   Pulmonary: Cough [ ] ; Wheezing[ ] ; Hemoptysis[ ] ; Sputum [ ] ; Snoring [ y]  GI: Vomiting[ ] ; Dysphagia[ ] ; Melena[ ] ; Hematochezia [ ] ; Heartburn[ ] ; Abdominal pain [ ] ; Constipation [ ] ; Diarrhea [ ] ; BRBPR [ ]   GU: Hematuria[ ] ; Dysuria [ ] ; Nocturia[ ]   Vascular: Pain in legs with walking [ ] ; Pain in feet with lying flat [ ] ; Non-healing sores [ ] ; Stroke [ ] ; TIA [ ] ; Slurred speech [ ] ;  Neuro: Headaches[ ] ; Vertigo[ ] ; Seizures[ ] ; Paresthesias[ ] ;Blurred vision [ ] ; Diplopia [ ] ; Vision changes [ ]   Ortho/Skin: Arthritis [ ] ; Joint pain [ ] ; Muscle pain [ ] ; Joint swelling [ ] ; Back Pain [ ] ; Rash [ ]   Psych: Depression[ ] ; Anxiety[ ]   Heme: Bleeding problems [ ] ; Clotting disorders [ ] ; Anemia Blue.Reese ]  Endocrine: Diabetes [ ] ; Thyroid dysfunction[ ]   Past Medical History:  Diagnosis Date   Anemia    Ectopic pregnancy    L adnexa   Hx of varicella    Hypertension    Current Outpatient Medications  Medication Sig Dispense Refill   carvedilol (COREG) 6.25 MG tablet Take 1 tablet (6.25 mg total) by mouth 2 (two) times daily with a meal. 60 tablet 0   dapagliflozin propanediol (  FARXIGA) 10 MG TABS tablet Take 1 tablet (10 mg total) by mouth daily before breakfast. 30 tablet 0   ferrous sulfate 325 (65 FE) MG tablet Take 1 tablet (325 mg total) by mouth every other day. 30 tablet 0   Multiple Vitamin (MV-ONE PO) Take 1 capsule by mouth every morning.     sacubitril-valsartan (ENTRESTO) 49-51 MG Take 1 tablet by mouth 2 (two) times daily. 60 tablet 0   spironolactone (ALDACTONE) 25 MG tablet Take 1 tablet (25 mg total) by mouth daily. 60 tablet 0   No current facility-administered medications for this encounter.   Allergies  Allergen  Reactions   Shellfish Allergy Anaphylaxis   Social History   Socioeconomic History   Marital status: Married    Spouse name: Not on file   Number of children: Not on file   Years of education: Not on file   Highest education level: Not on file  Occupational History   Not on file  Tobacco Use   Smoking status: Never   Smokeless tobacco: Never  Vaping Use   Vaping Use: Never used  Substance and Sexual Activity   Alcohol use: No   Drug use: No   Sexual activity: Yes    Birth control/protection: None  Other Topics Concern   Not on file  Social History Narrative   Not on file   Social Determinants of Health   Financial Resource Strain: Not on file  Food Insecurity: Not on file  Transportation Needs: Not on file  Physical Activity: Not on file  Stress: Not on file  Social Connections: Not on file  Intimate Partner Violence: Not on file   Family History  Problem Relation Age of Onset   Cancer Mother    Heart disease Mother    Heart disease Father    Hypertension Father    Stroke Father    Cancer Maternal Aunt    Diabetes Maternal Grandmother    Wt Readings from Last 3 Encounters:  12/07/21 80.5 kg  12/07/21 79.9 kg  12/03/21 78.8 kg   BP (!) 146/88    Pulse 80    Ht 4\' 10"  (1.473 m)    Wt 80.5 kg    SpO2 98%    BMI 37.08 kg/m   PHYSICAL EXAM: General:  NAD. No resp difficulty HEENT: Normal Neck: Supple. No JVD. Carotids 2+ bilat; no bruits. No lymphadenopathy or thryomegaly appreciated. Cor: PMI nondisplaced. Regular rate & rhythm. No rubs, gallops or murmurs. Lungs: Clear Abdomen: Soft, nontender, nondistended. No hepatosplenomegaly. No bruits or masses. Good bowel sounds. Extremities: No cyanosis, clubbing, rash, edema Neuro: Alert & oriented x 3, cranial nerves grossly intact. Moves all 4 extremities w/o difficulty. Affect pleasant.  ASSESSMENT & PLAN: 1. Chronic Systolic CHF: Echo this admission with EF 40-45% range with mild LV dilation and mild LVH,  moderate MR, moderate LAE, mildly decreased RV systolic function.  She has uncontrolled HTN is a major risk factor.  HIV and ANA negative and TSH normal.  No ETOH or drug history with negative UDS. Spontaneous miscarriages concerning for antiphospholipid antibody syndrome, but when this affects the heart, it generally leads to valvular problems and not cardiomyopathy. No strong FH of CMP.  No significant coronary disease on cath and filling pressures now normal on RHC after diuresis with normal cardiac output. Stable NYHA II, and she is not volume overloaded on exam.  - She does not appear to need a standing diuretic.   - Increase Entresto to 97/103  mg bid. Will review labs from PCP visit today when they result; BMET in 10-14 days. - Continue spironolactone 25 mg daily.  - Continue Coreg 6.25 mg bid.  - Continue Farxiga 10 mg daily. 2.  HTN: Elevated today. - Check BP at home and log. - Increase Entresto as above. 3.  H/o multiple miscarriages/spontaneous abortions: ?Antiphospholipid antibody syndrome.  - Serologic workup for APLAS negative. 4. 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 during recent admission. Recheck iron studies next visit. 5. 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  - She has follow up with TCTS soon. 6. Snoring: She also has daytime fatigue. - Arrange for home sleep study.  Follow up with PharmD in 3 weeks (increase beta blocker), APP in 6 weeks and Dr. Aundra Dubin in 12 weeks + echo.  Allena Katz, FNP-BC 12/07/21

## 2021-12-07 ENCOUNTER — Encounter (HOSPITAL_COMMUNITY): Payer: Self-pay

## 2021-12-07 ENCOUNTER — Encounter: Payer: Self-pay | Admitting: Nurse Practitioner

## 2021-12-07 ENCOUNTER — Other Ambulatory Visit: Payer: Self-pay

## 2021-12-07 ENCOUNTER — Ambulatory Visit: Payer: BC Managed Care – PPO | Admitting: Nurse Practitioner

## 2021-12-07 ENCOUNTER — Ambulatory Visit (HOSPITAL_COMMUNITY)
Admit: 2021-12-07 | Discharge: 2021-12-07 | Disposition: A | Discharge status: Home / Self Care | Payer: BC Managed Care – PPO | Attending: Family Medicine | Admitting: Family Medicine

## 2021-12-07 ENCOUNTER — Other Ambulatory Visit (HOSPITAL_COMMUNITY): Payer: Self-pay

## 2021-12-07 VITALS — BP 134/85 | HR 84 | Temp 98.0°F | Ht 59.0 in | Wt 176.2 lb

## 2021-12-07 VITALS — BP 146/88 | HR 80 | Ht <= 58 in | Wt 177.4 lb

## 2021-12-07 DIAGNOSIS — R5383 Other fatigue: Secondary | ICD-10-CM | POA: Diagnosis not present

## 2021-12-07 DIAGNOSIS — I428 Other cardiomyopathies: Secondary | ICD-10-CM | POA: Insufficient documentation

## 2021-12-07 DIAGNOSIS — Z79899 Other long term (current) drug therapy: Secondary | ICD-10-CM | POA: Diagnosis not present

## 2021-12-07 DIAGNOSIS — R9431 Abnormal electrocardiogram [ECG] [EKG]: Secondary | ICD-10-CM | POA: Diagnosis not present

## 2021-12-07 DIAGNOSIS — D509 Iron deficiency anemia, unspecified: Secondary | ICD-10-CM | POA: Insufficient documentation

## 2021-12-07 DIAGNOSIS — Z7984 Long term (current) use of oral hypoglycemic drugs: Secondary | ICD-10-CM | POA: Insufficient documentation

## 2021-12-07 DIAGNOSIS — Z8759 Personal history of other complications of pregnancy, childbirth and the puerperium: Secondary | ICD-10-CM | POA: Insufficient documentation

## 2021-12-07 DIAGNOSIS — J90 Pleural effusion, not elsewhere classified: Secondary | ICD-10-CM | POA: Diagnosis not present

## 2021-12-07 DIAGNOSIS — R222 Localized swelling, mass and lump, trunk: Secondary | ICD-10-CM | POA: Diagnosis not present

## 2021-12-07 DIAGNOSIS — R0683 Snoring: Secondary | ICD-10-CM | POA: Diagnosis not present

## 2021-12-07 DIAGNOSIS — I110 Hypertensive heart disease with heart failure: Secondary | ICD-10-CM | POA: Insufficient documentation

## 2021-12-07 DIAGNOSIS — I5021 Acute systolic (congestive) heart failure: Secondary | ICD-10-CM | POA: Diagnosis not present

## 2021-12-07 DIAGNOSIS — J811 Chronic pulmonary edema: Secondary | ICD-10-CM | POA: Diagnosis not present

## 2021-12-07 DIAGNOSIS — I5022 Chronic systolic (congestive) heart failure: Secondary | ICD-10-CM | POA: Diagnosis not present

## 2021-12-07 DIAGNOSIS — I10 Essential (primary) hypertension: Secondary | ICD-10-CM

## 2021-12-07 DIAGNOSIS — N96 Recurrent pregnancy loss: Secondary | ICD-10-CM

## 2021-12-07 DIAGNOSIS — G4730 Sleep apnea, unspecified: Secondary | ICD-10-CM

## 2021-12-07 DIAGNOSIS — I429 Cardiomyopathy, unspecified: Secondary | ICD-10-CM

## 2021-12-07 DIAGNOSIS — J9859 Other diseases of mediastinum, not elsewhere classified: Secondary | ICD-10-CM

## 2021-12-07 MED ORDER — ENTRESTO 97-103 MG PO TABS: 1.0000 | ORAL_TABLET | Freq: Two times a day (BID) | ORAL | 3 refills | Status: DC

## 2021-12-07 MED ORDER — CARVEDILOL 6.25 MG PO TABS: 6.2500 mg | ORAL_TABLET | Freq: Two times a day (BID) | ORAL | 3 refills | Status: DC

## 2021-12-07 MED ORDER — SPIRONOLACTONE 25 MG PO TABS: 25.0000 mg | ORAL_TABLET | Freq: Every day | ORAL | 3 refills | Status: DC

## 2021-12-07 MED ORDER — DAPAGLIFLOZIN PROPANEDIOL 10 MG PO TABS: 10.0000 mg | ORAL_TABLET | Freq: Every day | ORAL | 3 refills | Status: DC

## 2021-12-07 NOTE — Progress Notes (Signed)
[facility] Patient [facility] [address] [place], [place]  [address] Phone:  [phone]   Fax:  [phone] Subjective:   Patient ID: Alice Hays, female    DOB: April 09, 1981, 41 y.o.   MRN: [id number]  Chief Complaint  Patient presents with   Establish Care    Pt is here to establish care. Pt stated she was diagnosed with congestive heart failure and she wanted to follow up with a PCP   HPI Alice Hays 41 y.o. female  has a past medical history of Anemia, Ectopic pregnancy, varicella, and Hypertension.  To establish care and for hospital follow up. Last visit with PCP longer than 10 yrs.  Went to the ED 2 wks ago for difficulty breathing, BLE pain with swelling and lower back pain. Patient endorses symptom improvement since being discharged, after inpatient stay. Continues to have some fatigue with activity, which is gradually improving. 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 Denies adhering to diet and exercise regimen. Due to hectic work schedule, frequently gets meals from a vending machine. Currently compliant with all medications. Denies any cigarette smoking and/ substance usage. Social drinking only. Denies any other complaints today.   Denies any fever. Denies any fatigue, chest pain, shortness of breath, HA or dizziness. Denies any blurred vision, numbness or tingling.  Past Medical History:  Diagnosis Date   Anemia    Ectopic pregnancy    L adnexa   Hx of varicella    Hypertension     Past Surgical History:  Procedure Laterality Date   ARM SURGERY     CESAREAN SECTION N/A 06/29/2016   Procedure: CESAREAN SECTION;  Surgeon: Maxie Better, MD;  Location: [facility];  Service: Obstetrics;  Laterality: N/A;   RIGHT/LEFT HEART CATH AND CORONARY ANGIOGRAPHY N/A 11/30/2021   Procedure: RIGHT/LEFT HEART CATH AND CORONARY ANGIOGRAPHY;  Surgeon: Laurey Morale, MD;   Location: [facility] INVASIVE CV LAB;  Service: Cardiovascular;  Laterality: N/A;    Family History  Problem Relation Age of Onset   Cancer Mother    Heart disease Mother    Heart disease Father    Hypertension Father    Stroke Father    Cancer Maternal Aunt    Diabetes Maternal Grandmother     Social History   Socioeconomic History   Marital status: Married    Spouse name: Not on file   Number of children: Not on file   Years of education: Not on file   Highest education level: Not on file  Occupational History   Not on file  Tobacco Use   Smoking status: Never   Smokeless tobacco: Never  Vaping Use   Vaping Use: Never used  Substance and Sexual Activity   Alcohol use: No   Drug use: No   Sexual activity: Yes    Birth control/protection: None  Other Topics Concern   Not on file  Social History Narrative   Not on file   Social Determinants of Health   Financial Resource Strain: Not on file  Food Insecurity: Not on file  Transportation Needs: Not on file  Physical Activity: Not on file  Stress: Not on file  Social Connections: Not on file  Intimate Partner Violence: Not on file    Outpatient Medications Prior to Visit  Medication Sig Dispense Refill   ferrous sulfate 325 (65 FE) MG tablet Take 1 tablet (325 mg total) by mouth every  other day. 30 tablet 0   Multiple Vitamin (MV-ONE PO) Take 1 capsule by mouth every morning.     carvedilol (COREG) 6.25 MG tablet Take 1 tablet (6.25 mg total) by mouth 2 (two) times daily with a meal. 60 tablet 0   dapagliflozin propanediol (FARXIGA) 10 MG TABS tablet Take 1 tablet (10 mg total) by mouth daily before breakfast. 30 tablet 0   sacubitril-valsartan (ENTRESTO) 49-51 MG Take 1 tablet by mouth 2 (two) times daily. 60 tablet 0   spironolactone (ALDACTONE) 25 MG tablet Take 1 tablet (25 mg total) by mouth daily. 60 tablet 0   No facility-administered medications prior to visit.    Allergies  Allergen Reactions   Shellfish  Allergy Anaphylaxis    Review of Systems  Constitutional:  Negative for chills, fever and malaise/fatigue.  HENT: Negative.    Eyes: Negative.   Respiratory:  Positive for shortness of breath. Negative for cough, hemoptysis, sputum production and wheezing.   Cardiovascular:  Positive for leg swelling. Negative for chest pain and palpitations.  Gastrointestinal:  Negative for abdominal pain, blood in stool, constipation, diarrhea, nausea and vomiting.  Genitourinary: Negative.   Musculoskeletal:        See HPI  Skin: Negative.   Neurological: Negative.   Psychiatric/Behavioral:  Negative for depression, hallucinations, memory loss, substance abuse and suicidal ideas. The patient is not nervous/anxious and does not have insomnia.   All other systems reviewed and are negative.     Objective:    Physical Exam Constitutional:      General: She is not in acute distress.    Appearance: Normal appearance.  HENT:     Head: Normocephalic.     Right Ear: Tympanic membrane, ear canal and external ear normal. There is no impacted cerumen.     Left Ear: Tympanic membrane, ear canal and external ear normal. There is no impacted cerumen.     Nose: Nose normal. No congestion or rhinorrhea.     Mouth/Throat:     Mouth: Mucous membranes are moist.     Pharynx: Oropharynx is clear. No oropharyngeal exudate or posterior oropharyngeal erythema.  Eyes:     General: No scleral icterus.       Right eye: No discharge.        Left eye: No discharge.     Extraocular Movements: Extraocular movements intact.     Conjunctiva/sclera: Conjunctivae normal.     Pupils: Pupils are equal, round, and reactive to light.  Neck:     Vascular: No carotid bruit.  Cardiovascular:     Rate and Rhythm: Normal rate and regular rhythm.     Pulses: Normal pulses.     Heart sounds: Normal heart sounds.     Comments: Mild LLE swelling Pulmonary:     Effort: Pulmonary effort is normal.     Breath sounds: Normal breath  sounds.  Abdominal:     General: Abdomen is flat. Bowel sounds are normal. There is no distension.     Palpations: Abdomen is soft. There is no mass.     Tenderness: There is no abdominal tenderness. There is no right CVA tenderness, left CVA tenderness, guarding or rebound.     Hernia: No hernia is present.  Musculoskeletal:        General: No swelling, tenderness, deformity or signs of injury. Normal range of motion.     Cervical back: Normal range of motion and neck supple. No rigidity or tenderness.     Right lower  leg: No edema.     Left lower leg: Edema present.  Lymphadenopathy:     Cervical: No cervical adenopathy.  Skin:    General: Skin is warm and dry.     Capillary Refill: Capillary refill takes less than 2 seconds.  Neurological:     General: No focal deficit present.     Mental Status: She is alert and oriented to person, place, and time.  Psychiatric:        Mood and Affect: Mood normal.        Behavior: Behavior normal.        Thought Content: Thought content normal.        Judgment: Judgment normal.    BP 134/85    Pulse 84    Temp 98 F (36.7 C)    Ht $R'4\' 11"'aN$  (1.499 m)    Wt 176 lb 4 oz (79.9 kg)    SpO2 100%    BMI 35.60 kg/m  Wt Readings from Last 3 Encounters:  12/07/21 177 lb 6.4 oz (80.5 kg)  12/07/21 176 lb 4 oz (79.9 kg)  12/03/21 173 lb 12.8 oz (78.8 kg)    Immunization History  Administered Date(s) Administered   Unspecified SARS-COV-2 Vaccination 05/11/2019, 04/04/2020, 08/09/2021    Diabetic Foot Exam - Simple   No data filed     Lab Results  Component Value Date   TSH 1.500 11/29/2021   Lab Results  Component Value Date   WBC 5.3 12/07/2021   HGB 11.2 12/07/2021   HCT 38.2 12/07/2021   MCV 78 (L) 12/07/2021   PLT 300 12/07/2021   Lab Results  Component Value Date   NA 142 12/07/2021   K 5.0 12/07/2021   CO2 25 12/07/2021   GLUCOSE 102 (H) 12/07/2021   BUN 16 12/07/2021   CREATININE 1.24 (H) 12/07/2021   BILITOT 0.2  12/07/2021   ALKPHOS 71 12/07/2021   AST 20 12/07/2021   ALT 41 (H) 12/07/2021   PROT 6.6 12/07/2021   ALBUMIN 4.2 12/07/2021   CALCIUM 9.6 12/07/2021   ANIONGAP 7 12/03/2021   EGFR 57 (L) 12/07/2021   Lab Results  Component Value Date   CHOL 270 (H) 12/07/2021   CHOL 208 (H) 11/29/2021   Lab Results  Component Value Date   HDL 63 12/07/2021   HDL 46 11/29/2021   Lab Results  Component Value Date   LDLCALC 184 (H) 12/07/2021   LDLCALC 147 (H) 11/29/2021   Lab Results  Component Value Date   TRIG 127 12/07/2021   TRIG 77 11/29/2021   Lab Results  Component Value Date   CHOLHDL 4.3 12/07/2021   CHOLHDL 4.5 11/29/2021   Lab Results  Component Value Date   HGBA1C 5.9 (H) 11/29/2021       Assessment & Plan:   Problem List Items Addressed This Visit       Cardiovascular and Mediastinum   Acute systolic CHF (congestive heart failure) ([place]) - Primary   Relevant Orders   CBC with Differential/Platelet (Completed)   Comprehensive metabolic panel (Completed)   Lipid panel (Completed) A1C: 5.9, prediabetic Encouraged continued diet and exercise efforts  Encouraged continued compliance with medication   Follow up in 3 mths for reevaluation of hypertension and peripheral edema, sooner as needed     I am having Marinell L. Choma maintain her Multiple Vitamin (MV-ONE PO) and ferrous sulfate.  No orders of the defined types were placed in this encounter.    Teena Dunk, NP

## 2021-12-07 NOTE — Patient Instructions (Signed)
You were seen today in the [facility] for hospital follow up and to establish care. Labs were collected, results will be available via MyChart or, if abnormal, you will be contacted by clinic staff. You were prescribed medications, please take as directed. Please follow up in 3 mths for reevaluation.

## 2021-12-07 NOTE — Patient Instructions (Signed)
INCREASE Entresto to 97/103 mg, one tab twice a day  Labs needed in 7-10 days  Your provider has recommended that you have a home sleep study.  This has to be approved by your insurance company. We will schedule you an appointment to pick up the equipment to give [place] time to complete this authorization. Once you have the equipment you will download the app on your phone and follow the instructions. YOUR PIN NUMBER IS: [id number]. Once you have completed the test the information is sent to the company through [employer] and you can dispose of the equipment. If your test is positive you will receive a call from Dr Norris Cross office [facility]) to set up your CPAP equipment.  Your physician recommends that you schedule a follow-up appointment in: 3 weeks with the pharmacy team, in 6 weeks  in the Advanced Practitioners (PA/NP) Clinic, and in 12 weeks with Dr Birdie Riddle  Your physician has requested that you have an echocardiogram. Echocardiography is a painless test that uses sound waves to create images of your heart. It provides your doctor with information about the size and shape of your heart and how well your hearts chambers and valves are working. This procedure takes approximately one hour. There are no restrictions for this procedure.  Do the following things EVERYDAY: Weigh yourself in the morning before breakfast. Write it down and keep it in a log. Take your medicines as prescribed Eat low salt foods--Limit salt (sodium) to 2000 mg per day.  Stay as active as you can everyday Limit all fluids for the day to less than 2 liters  At the [facility], you and your health needs are our priority. As part of our continuing mission to provide you with exceptional heart care, we have created designated Provider Care Teams. These Care Teams include your primary Cardiologist (physician) and Advanced Practice Providers (APPs- Physician Assistants and Nurse Practitioners) who  all work together to provide you with the care you need, when you need it.   You may see any of the following providers on your designated Care Team at your next follow up: Dr Arvilla Meres Dr Carron Curie, NP Robbie Lis, [place] [facility] [place], [place] Karle Plumber, PharmD   Please be sure to bring in all your medications bottles to every appointment.   If you have any questions or concerns before your next appointment please send [place] a message through [place] or call our office at [phone].    TO LEAVE A MESSAGE FOR THE NURSE SELECT OPTION 2, PLEASE LEAVE A MESSAGE INCLUDING: YOUR NAME DATE OF BIRTH CALL BACK NUMBER REASON FOR CALL**this is important as we prioritize the call backs  YOU WILL RECEIVE A CALL BACK THE SAME DAY AS LONG AS YOU CALL BEFORE 4:00 PM

## 2021-12-07 NOTE — Progress Notes (Signed)
Patient Name:  Alice Hays        DOB: 29-May-1981       Height: 4'10"    Weight: 177 lb  Office Name:[facility]         Referring Provider: Mikki Santee Marca Ancona, MD  Today's Date:12/07/2021    STOP BANG RISK ASSESSMENT S (snore) Have you been told that you snore?     YES   T (tired) Are you often tired, fatigued, or sleepy during the day?   YES  O (obstruction) Do you stop breathing, choke, or gasp during sleep? YES   P (pressure) Do you have or are you being treated for high blood pressure? YES   B (BMI) Is your body index greater than 35 kg/m? YES   A (age) Are you 79 years old or older? NO   N (neck) Do you have a neck circumference greater than 16 inches?   NO   G (gender) Are you a female? NO   TOTAL STOP/BANG YES ANSWERS                                                                        For Office Use Only              Procedure Order Form    YES to 3+ Stop Bang questions OR two clinical symptoms - patient qualifies for WatchPAT (CPT 95800)             Clinical Notes: Will consult Sleep Specialist and refer for management of therapy due to patient increased risk of Sleep Apnea. Ordering a sleep study due to the following two clinical symptoms: Excessive daytime sleepiness G47.10  / Loud snoring R06.83 Unrefreshed by sleep G47.8 /History of high blood pressure R03.0 0    I understand that I am proceeding with a home sleep apnea test as ordered by my treating physician. I understand that untreated sleep apnea is a serious cardiovascular risk factor and it is my responsibility to perform the test and seek management for sleep apnea. I will be contacted with the results and be managed for sleep apnea by a local sleep physician. I will be receiving equipment and further instructions from [facility]. I shall promptly ship back the equipment via the included mailing label. I understand my insurance will be billed for the test and as  the patient I am responsible for any insurance related out-of-pocket costs incurred. I have been provided with written instructions and can call for additional video or telephonic instruction, with 24-hour availability of qualified personnel to answer any questions: Patient Help Desk [phone].  Patient Signature ______________________________________________________   Date______________________ Patient Telemedicine Verbal Consent

## 2021-12-08 LAB — COMPREHENSIVE METABOLIC PANEL
ALT: 41 IU/L — ABNORMAL HIGH (ref 0–32)
AST: 20 IU/L (ref 0–40)
Albumin/Globulin Ratio: 1.8 (ref 1.2–2.2)
Albumin: 4.2 g/dL (ref 3.8–4.8)
Alkaline Phosphatase: 71 IU/L (ref 44–121)
BUN/Creatinine Ratio: 13 (ref 9–23)
BUN: 16 mg/dL (ref 6–20)
Bilirubin Total: 0.2 mg/dL (ref 0.0–1.2)
CO2: 25 mmol/L (ref 20–29)
Calcium: 9.6 mg/dL (ref 8.7–10.2)
Chloride: 102 mmol/L (ref 96–106)
Creatinine, Ser: 1.24 mg/dL — ABNORMAL HIGH (ref 0.57–1.00)
Globulin, Total: 2.4 g/dL (ref 1.5–4.5)
Glucose: 102 mg/dL — ABNORMAL HIGH (ref 70–99)
Potassium: 5 mmol/L (ref 3.5–5.2)
Sodium: 142 mmol/L (ref 134–144)
Total Protein: 6.6 g/dL (ref 6.0–8.5)
eGFR: 57 mL/min/{1.73_m2} — ABNORMAL LOW (ref >59)

## 2021-12-08 LAB — CBC WITH DIFFERENTIAL/PLATELET
Basophils Absolute: 0 10*3/uL (ref 0.0–0.2)
Basos: 1 %
EOS (ABSOLUTE): 0.5 10*3/uL — ABNORMAL HIGH (ref 0.0–0.4)
Eos: 10 %
Hematocrit: 38.2 % (ref 34.0–46.6)
Hemoglobin: 11.2 g/dL (ref 11.1–15.9)
Immature Grans (Abs): 0 10*3/uL (ref 0.0–0.1)
Immature Granulocytes: 0 %
Lymphocytes Absolute: 1.7 10*3/uL (ref 0.7–3.1)
Lymphs: 33 %
MCH: 22.8 pg — ABNORMAL LOW (ref 26.6–33.0)
MCHC: 29.3 g/dL — ABNORMAL LOW (ref 31.5–35.7)
MCV: 78 fL — ABNORMAL LOW (ref 79–97)
Monocytes Absolute: 0.5 10*3/uL (ref 0.1–0.9)
Monocytes: 10 %
Neutrophils Absolute: 2.5 10*3/uL (ref 1.4–7.0)
Neutrophils: 46 %
Platelets: 300 10*3/uL (ref 150–450)
RBC: 4.92 x10E6/uL (ref 3.77–5.28)
RDW: 19.7 % — ABNORMAL HIGH (ref 11.7–15.4)
WBC: 5.3 10*3/uL (ref 3.4–10.8)

## 2021-12-08 LAB — LIPID PANEL
Chol/HDL Ratio: 4.3 ratio (ref 0.0–4.4)
Cholesterol, Total: 270 mg/dL — ABNORMAL HIGH (ref 100–199)
HDL: 63 mg/dL (ref >39)
LDL Chol Calc (NIH): 184 mg/dL — ABNORMAL HIGH (ref 0–99)
Triglycerides: 127 mg/dL (ref 0–149)
VLDL Cholesterol Cal: 23 mg/dL (ref 5–40)

## 2021-12-10 ENCOUNTER — Other Ambulatory Visit: Payer: Self-pay | Admitting: Nurse Practitioner

## 2021-12-10 DIAGNOSIS — E7849 Other hyperlipidemia: Secondary | ICD-10-CM

## 2021-12-10 MED ORDER — ATORVASTATIN CALCIUM 40 MG PO TABS: 40.0000 mg | ORAL_TABLET | Freq: Every day | ORAL | 5 refills | Status: DC

## 2021-12-17 ENCOUNTER — Telehealth (HOSPITAL_COMMUNITY): Payer: Self-pay

## 2021-12-17 NOTE — Telephone Encounter (Signed)
All disability forms faxed on 12/17/2021

## 2021-12-19 ENCOUNTER — Telehealth: Payer: Self-pay

## 2021-12-20 ENCOUNTER — Telehealth (HOSPITAL_COMMUNITY): Payer: Self-pay | Admitting: Surgery

## 2021-12-20 ENCOUNTER — Encounter (HOSPITAL_COMMUNITY): Payer: Self-pay | Admitting: *Deleted

## 2021-12-20 NOTE — Telephone Encounter (Signed)
Patient called and reminded of ordered home sleep study.  I left a message to indicate that we had not received her study and asking her to complete.

## 2021-12-24 NOTE — Telephone Encounter (Signed)
No additional notes needed  

## 2021-12-26 NOTE — Progress Notes (Incomplete)
***In Progress***    [facility] Note   Primary Care: Orion Crook, NP HF Cardiologist: Dr. 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    Admitted 11/2021 with new systolic HF. She was markedly hypertensive with SBP 213/140. 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 CTS consulted regarding mediastinal mass. Echo showed EF 45%, RV mildly reduced, moderate MR. She was diuresed with IV Lasix and AHF consulted. Underwent R/LHC showing no significant CAD, NICM, low filling pressures. CMRI showed normal LV size with mild LV hypertrophy, EF of 31% with diffuse hypokinesis, normal RV size with mild systolic dysfunction and EF of 38%, and no definite LGE noted.  She was started on GDMT.    She returned for post hospital HF follow up on 12/07/21. She was mildly dyspneic with ADLs and with exertion. She was fatigued and reported snoring. Denied palpitations, abnormal bleeding, CP, dizziness, edema, or PND/Orthopnea. Appetite was ok. Denied fever or chills. Weight at home was 178 pounds. Reported taking all medications. Reported working at [employer]. No ETOH/tobacco/ drug use. She has a 49 year old daughter.   Today she returns to HF clinic for pharmacist medication titration. At last visit with NP, increased Entresto to 97-103 mg BID. Patient did not complete follow up BMET. ***  Overall feeling ***. Dizziness, lightheadedness, fatigue:  Chest pain or palpitations:  How is your breathing?: *** SOB: Able to complete all ADLs. Activity level ***  Weight at home pounds. Takes furosemide/torsemide/bumex *** mg *** daily.  LEE PND/Orthopnea  Appetite *** Low-salt diet:   Physical  Exam Cost/affordability of meds   HF Medications: Carvedilol 6.25 mg BID Entresto 97-103 mg BID Spironolactone 25 mg daily Farxiga 10 mg daily  Has the patient been experiencing any side effects to the medications prescribed?  {YES NO:22349}  Does the patient have any problems obtaining medications due to transportation or finances?   {YES NO:22349}  Understanding of regimen: {excellent/good/fair/poor:19665} Understanding of indications: {excellent/good/fair/poor:19665} Potential of compliance: {excellent/good/fair/poor:19665} Patient understands to avoid NSAIDs. Patient understands to avoid decongestants.    Pertinent Lab Values: 12/07/21: Serum creatinine 1.24, BUN 16, Potassium 5.0, Sodium 142 ***  Vital Signs: Weight: *** (last clinic weight: 177.4 lbs) Blood pressure: ***  Heart rate: ***   Assessment/Plan: 1. Chronic Systolic CHF: Echo this admission with EF 40-45% range with mild LV dilation and mild LVH, moderate MR, moderate LAE, mildly decreased RV systolic function.  She has uncontrolled HTN is a major risk factor.  HIV and ANA negative and TSH normal.  No ETOH or drug history with negative UDS. Spontaneous miscarriages concerning for antiphospholipid antibody syndrome, but when this affects the heart, it generally leads to valvular problems and not cardiomyopathy. No strong FH of CMP.  No significant coronary disease on cath and filling pressures now normal on RHC after diuresis with normal cardiac output. *** Stable NYHA II, and she is not volume overloaded on exam.  - She does not appear to need a standing diuretic.   - Continue carvedilol 6.25 mg BID.  - *** Continue Entresto to 97/103 mg BID. Will review labs from PCP visit today when they result; BMET in 10-14 days.  - Continue spironolactone 25 mg daily.  - Continue Farxiga 10 mg daily. 2.  HTN: Elevated today. - Check BP at home and log. - *** Continue Entresto as above. 3.  H/o multiple  miscarriages/spontaneous abortions: ?Antiphospholipid antibody syndrome.  - Serologic workup for APLAS negative. 4. 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 during recent admission. *** Recheck iron studies next visit. 5. 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  - *** She has follow up with TCTS soon. 6. Snoring: She also reported daytime fatigue. - Nurse left message on 2/23 for her to complete her sleep study.  Follow up ***. Next visit 01/17/22 for echo and APP clinic.    Karle Plumber, PharmD, BCPS, BCCP, CPP Heart Failure Clinic Pharmacist [phone]

## 2021-12-27 NOTE — Progress Notes (Signed)
[address]       [place] [address]             [phone]                    DESERAY DAPONTE [facility] Medical Record #[id number] Date of Birth: 1980/12/28  Referring: Laurey Morale, MD Primary Care: Orion Crook I, NP Primary Cardiologist: None  Chief Complaint:    Chief Complaint  Patient presents with   Mediastinal Mass    Surgical consult, CTA Chest 11/28/21, MR Cardiac 12/03/21    History of Present Illness:    Alice Hays 41 y.o. female presents for surgical evaluation of an anterior mediastinal nodularity that was found incidentally on cross-sectional imaging.  She was newly diagnosed with congestive heart failure and during her work-up she underwent a CT scan which identified this.  She does have a history of hypertension.  In regards to her symptoms, she occasionally has a brief episode of blurred vision.  This occurs randomly.  She denies any other neurologic or musculoskeletal issues.       Zubrod Score: At the time of surgery this patients most appropriate activity status/level should be described as: [x]     0    Normal activity, no symptoms []     1    Restricted in physical strenuous activity but ambulatory, able to do out light work []     2    Ambulatory and capable of self care, unable to do work activities, up and about               >50 % of waking hours                              []     3    Only limited self care, in bed greater than 50% of waking hours []     4    Completely disabled, no self care, confined to bed or chair []     5    Moribund   Past Medical History:  Diagnosis Date   Anemia    Ectopic pregnancy    L adnexa   Hx of varicella    Hypertension     Past Surgical History:  Procedure Laterality Date   ARM SURGERY     CESAREAN SECTION N/A 06/29/2016   Procedure: CESAREAN SECTION;  Surgeon: , MD;  Location: [facility];  Service: Obstetrics;  Laterality: N/A;   RIGHT/LEFT  HEART CATH AND CORONARY ANGIOGRAPHY N/A 11/30/2021   Procedure: RIGHT/LEFT HEART CATH AND CORONARY ANGIOGRAPHY;  Surgeon: , MD;  Location: [facility] INVASIVE CV LAB;  Service: Cardiovascular;  Laterality: N/A;    Family History  Problem Relation Age of Onset   Cancer Mother    Heart disease Mother    Heart disease Father    Hypertension Father    Stroke Father    Cancer Maternal Aunt    Diabetes Maternal Grandmother      Social History   Tobacco Use  Smoking Status Never  Smokeless Tobacco Never    Social History   Substance and Sexual Activity  Alcohol Use No     Allergies  Allergen Reactions   Shellfish Allergy Anaphylaxis    Current Outpatient Medications  Medication Sig Dispense Refill   atorvastatin (LIPITOR) 40 MG tablet Take 1 tablet (40 mg total) by mouth daily.  30 tablet 5   carvedilol (COREG) 6.25 MG tablet Take 1 tablet (6.25 mg total) by mouth 2 (two) times daily with a meal. 180 tablet 3   dapagliflozin propanediol (FARXIGA) 10 MG TABS tablet Take 1 tablet (10 mg total) by mouth daily before breakfast. 90 tablet 3   ferrous sulfate 325 (65 FE) MG tablet Take 1 tablet (325 mg total) by mouth every other day. 30 tablet 0   Multiple Vitamin (MV-ONE PO) Take 1 capsule by mouth every morning.     sacubitril-valsartan (ENTRESTO) 97-103 MG Take 1 tablet by mouth 2 (two) times daily. 180 tablet 3   spironolactone (ALDACTONE) 25 MG tablet Take 1 tablet (25 mg total) by mouth daily. 90 tablet 3   No current facility-administered medications for this visit.    Review of Systems  Constitutional: Negative.   Eyes:  Positive for blurred vision.  Respiratory: Negative.    Cardiovascular:  Positive for chest pain.  Neurological: Negative.     PHYSICAL EXAMINATION: BP (!) 168/91 (BP Location: Left Arm, Patient Position: Sitting)    Pulse 83    Resp 20    Ht 4\' 10"  (1.473 m)    SpO2 97% Comment: RA   BMI 37.08 kg/m  Physical Exam Constitutional:       General: She is not in acute distress.    Appearance: Normal appearance. She is not ill-appearing.  HENT:     Head: Normocephalic and atraumatic.  Eyes:     Extraocular Movements: Extraocular movements intact.  Cardiovascular:     Rate and Rhythm: Normal rate.  Pulmonary:     Effort: Pulmonary effort is normal. No respiratory distress.  Abdominal:     General: There is no distension.  Musculoskeletal:        General: Normal range of motion.     Cervical back: Normal range of motion.  Neurological:     General: No focal deficit present.     Mental Status: She is alert and oriented to person, place, and time.  Psychiatric:        Mood and Affect: Mood normal.        Behavior: Behavior normal.    Diagnostic Studies & Laboratory data:     Recent Radiology Findings:   CT Angio Chest PE W/Cm &/Or Wo Cm  Result Date: 11/28/2021 CLINICAL DATA:  41 year old female presents with shortness of breath and leg pain. EXAM: CT ANGIOGRAPHY CHEST WITH CONTRAST TECHNIQUE: Multidetector CT imaging of the chest was performed using the standard protocol during bolus administration of intravenous contrast. Multiplanar CT image reconstructions and MIPs were obtained to evaluate the vascular anatomy. RADIATION DOSE REDUCTION: This exam was performed according to the departmental dose-optimization program which includes automated exposure control, adjustment of the mA and/or kV according to patient size and/or use of iterative reconstruction technique. CONTRAST:  43mL OMNIPAQUE IOHEXOL 350 MG/ML SOLN COMPARISON:  None FINDINGS: Cardiovascular: Question of mild pericardial thickening. Heart size is enlarged. Moderately to markedly enlarged accounting for low lung volumes. Aortic caliber is normal. Central pulmonary vasculature is opacified to 314 Hounsfield units. Pulmonary vascular bed evaluated to the segmental level without evidence of pulmonary embolism Mediastinum/Nodes: Esophagus is grossly normal. There is no  thoracic inlet lymphadenopathy. No axillary lymphadenopathy. No hilar lymphadenopathy. Nodular appearing anterior mediastinal soft tissue is noted along the anterior surface of the heart. This measures approximately 1.51.7 cm greatest thickness no discrete adenopathy along the RIGHT paratracheal chain or in the subcarinal region Lungs/Pleura: Small bilateral pleural  effusions. Mosaic attenuation in the lung parenchyma towards the lung apices. Mild basilar atelectasis and signs of septal thickening. Upper Abdomen: Low attenuation in the anterior LEFT hemi liver likely a small cyst not well characterized on the current exam and measuring greater than 30 Hounsfield units (image 126/5) this measures 15 mm. Infiltration of the fat about the adrenal glands may be present though low-dose evaluation limits assessment of this area. No additional findings of note in the upper abdomen. Musculoskeletal: No chest wall abnormality. No acute or significant osseous findings. Review of the MIP images confirms the above findings. IMPRESSION: 1. No evidence of pulmonary embolism to the segmental level. 2. Findings of congestive heart failure with edema, septal thickening and small bilateral pleural effusions. Cardiomegaly, potentially representing cardiomyopathy in a patient of this age. 3. Nodular appearing anterior mediastinal soft tissue is noted along the anterior surface of the heart. This measures approximately 1.5-1.7 cm greatest thickness. 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 Consider correlation with thyroid function as Grave's disease as an association with both thymic hyperplasia and cardiac dysfunction. Would consider three-month follow-up with either CT or MRI to ensure resolution and or stability of anterior mediastinal soft tissue. Would also correlate with any known history of autoimmune disorder a could lead to cardiac  dysfunction and thymic hyperplasia such as lupus. 4. Low attenuation along the anterior surface of the LEFT hemi liver is almost certainly a benign cyst though with attenuation values in the range of 30 Hounsfield units consider follow-up abdominal ultrasound four complete characterization. 5. Subtle stranding in the retroperitoneal fat of the upper abdomen may be related to generalized edema developing in the setting of cardiac dysfunction. Correlate with any abdominal symptoms with abdominal imaging as warranted for further assessment. Electronically Signed   By: Donzetta Kohut M.D.   On: 11/28/2021 21:47   CARDIAC CATHETERIZATION  Result Date: 11/30/2021 1. No significant coronary disease, nonischemic cardiomyopathy. 2. Low filling pressures. 3. Preserved cardiac output.   DG Chest Port 1 View  Result Date: 11/28/2021 CLINICAL DATA:  Shortness of breath EXAM: PORTABLE CHEST 1 VIEW COMPARISON:  Chest x-ray dated August 04, 2021 FINDINGS: Increased enlargement of the cardiac contours compared with prior exam, although evaluation is somewhat limited due to differences in technique. Mild bilateral interstitial opacities. No large pleural effusion or pneumothorax. IMPRESSION: 1. Increased enlargement of the cardiac contours when compared with prior exam. Recommend echocardiography for further evaluation. 2. Mild bilateral interstitial opacities, likely due to pulmonary edema. Electronically Signed   By: Allegra Lai M.D.   On: 11/28/2021 17:29   MR CARDIAC MORPHOLOGY W WO CONTRAST  Result Date: 12/03/2021 CLINICAL DATA:  Cardiomyopathy of uncertain etiology EXAM: CARDIAC MRI TECHNIQUE: The patient was scanned on a 1.5 Tesla GE magnet. A dedicated cardiac coil was used. Functional imaging was done using Fiesta sequences. 2,3, and 4 chamber views were done to assess for RWMA's. Modified Simpson's rule using a short axis stack was used to calculate an ejection fraction on a dedicated work Barrister's clerk. The patient received 8 cc of Gadavist. After 10 minutes inversion recovery sequences were used to assess for infiltration and scar tissue. FINDINGS: Limited images of the lung fields showed no gross abnormalities. Normal left ventricular size with mild LV hypertrophy. Moderate LV dysfunction with global hypokinesis, EF 31%. Normal right ventricular size with mildly decreased systolic function, EF 38%. Normal right  and left atrial sizes. Mild tricuspid regurgitation. Moderate mitral regurgitation (flow sequences to quantify were not done). Trileaflet aortic valve with no stenosis or regurgitation. Delayed enhancement images were difficult due to recent receipt of Feraheme. However, no evidence for late gadolinium enhancement (LGE) was visualized. MEASUREMENTS: MEASUREMENTS LVEDV 188 mL LVSV 58 mL LVEF 31% RVEDV 154 mL RVSV 59 RVEF 38% T1 indices not likely to be accurate with recent Feraheme IMPRESSION: 1. Normal LV size with mild LV hypertrophy. EF 31%, diffuse hypokinesis. 2.  Normal RV size with mild systolic dysfunction, EF 38%. 3. Delayed enhancement images difficult due to recent Feraheme, but no definite LGE noted. Dalton Mclean Electronically Signed   By: Marca Ancona M.D.   On: 12/03/2021 10:18   ECHOCARDIOGRAM COMPLETE  Result Date: 11/29/2021    ECHOCARDIOGRAM REPORT   Patient Name:   Alice Hays Date of Exam: 11/29/2021 Medical Rec #:  [id number]          Height:       58.0 in Accession #:    [id number]         Weight:       181.0 lb Date of Birth:  [birth year]          BSA:          1.745 m Patient Age:    40 years           BP:           182/117 mmHg Patient Gender: F                  HR:           104 bpm. Exam Location:  Inpatient Procedure: 2D Echo, 3D Echo, Cardiac Doppler, Color Doppler and Strain Analysis Indications:    Cardiomegaly I51.7  History:        Patient has no prior history of Echocardiogram examinations.                 Acute Respiratory Distress, Hypoxemia,  New Onset Heart Failure.                 Leg pain/swelling.  Sonographer:    Leta Jungling RDCS Referring Phys: [phone] NATHAN PICKERING IMPRESSIONS  1. Global longitudinal strain -11.8% Basal inferior/inferoseptal and lateral hypokinesis. . Left ventricular ejection fraction, by estimation, is 45%%. The left ventricle has mildly decreased function. The left ventricular internal cavity size was mildly dilated. There is mild left ventricular hypertrophy. Left ventricular diastolic parameters are indeterminate.  2. Right ventricular systolic function is mildly reduced. The right ventricular size is normal. There is normal pulmonary artery systolic pressure.  3. Left atrial size was mildly dilated.  4. Moderate mitral valve regurgitation.  5. The aortic valve is normal in structure. Aortic valve regurgitation is not visualized.  6. The inferior vena cava is normal in size with greater than 50% respiratory variability, suggesting right atrial pressure of 3 mmHg. FINDINGS  Left Ventricle: Global longitudinal strain -11.8% Basal inferior/inferoseptal and lateral hypokinesis. Left ventricular ejection fraction, by estimation, is 45%%. The left ventricle has mildly decreased function. The left ventricular internal cavity size was mildly dilated. There is mild left ventricular hypertrophy. Left ventricular diastolic parameters are indeterminate. Right Ventricle: The right ventricular size is normal. Right vetricular wall thickness was not assessed. Right ventricular systolic function is mildly reduced. There is normal pulmonary artery systolic pressure. The tricuspid regurgitant velocity is 2.26  m/s, and with an assumed right atrial pressure of  3 mmHg, the estimated right ventricular systolic pressure is 23.4 mmHg. Left Atrium: Left atrial size was mildly dilated. Right Atrium: Right atrial size was normal in size. Pericardium: There is no evidence of pericardial effusion. Mitral Valve: There is mild thickening of the mitral  valve leaflet(s). Moderate mitral valve regurgitation. Tricuspid Valve: The tricuspid valve is normal in structure. Tricuspid valve regurgitation is mild. Aortic Valve: The aortic valve is normal in structure. Aortic valve regurgitation is not visualized. Pulmonic Valve: The pulmonic valve was normal in structure. Pulmonic valve regurgitation is trivial. Aorta: The aortic root and ascending aorta are structurally normal, with no evidence of dilitation. Venous: The inferior vena cava is normal in size with greater than 50% respiratory variability, suggesting right atrial pressure of 3 mmHg. IAS/Shunts: No atrial level shunt detected by color flow Doppler.  LEFT VENTRICLE PLAX 2D LVIDd:         5.50 cm LVIDs:         4.20 cm LV PW:         1.20 cm LV IVS:        1.20 cm LVOT diam:     2.00 cm   3D Volume EF: LV SV:         41        3D EF:        40 % LV SV Index:   24        LV EDV:       216 ml LVOT Area:     3.14 cm  LV ESV:       129 ml                          LV SV:        87 ml RIGHT VENTRICLE RV S prime:     14.30 cm/s TAPSE (M-mode): 3.1 cm LEFT ATRIUM             Index        RIGHT ATRIUM           Index LA diam:        4.30 cm 2.46 cm/m   RA Area:     21.20 cm LA Vol (A2C):   77.8 ml 44.57 ml/m  RA Volume:   60.30 ml  34.55 ml/m LA Vol (A4C):   75.2 ml 43.08 ml/m LA Biplane Vol: 78.1 ml 44.74 ml/m  AORTIC VALVE LVOT Vmax:   85.30 cm/s LVOT Vmean:  54.800 cm/s LVOT VTI:    0.132 m  AORTA Ao Root diam: 2.70 cm Ao Asc diam:  3.00 cm MITRAL VALVE                  TRICUSPID VALVE MV Area (PHT): 3.43 cm       TR Peak grad:   20.4 mmHg MV Decel Time: 221 msec       TR Vmax:        226.00 cm/s MR Peak grad:    147.9 mmHg MR Mean grad:    95.0 mmHg    SHUNTS MR Vmax:         608.00 cm/s  Systemic VTI:  0.13 m MR Vmean:        449.0 cm/s   Systemic Diam: 2.00 cm MR PISA:         1.01 cm MR PISA Eff ROA: 6 mm MR PISA Radius:  0.40 cm MV E velocity: 92.95 cm/s Gunnar Fusi  Tenny Craw MD Electronically signed by Dietrich Pates  MD Signature Date/Time: 11/29/2021/12:38:21 PM    Final    VAS [place] LOWER EXTREMITY VENOUS (DVT) (7a-7p)  Result Date: 11/29/2021  Lower Venous DVT Study Patient Name:  Alice Hays  Date of Exam:   11/28/2021 Medical Rec #: [id number]           Accession #:    [id number] Date of Birth: 30-Aug-1981           Patient Gender: F Patient Age:   10 years Exam Location:  [facility] Procedure:      VAS [place] LOWER EXTREMITY VENOUS (DVT) Referring Phys: Ivin Booty GEIPLE --------------------------------------------------------------------------------  Indications: Edema, and SOB.  Comparison Study: No prior study Performing Technologist: Gertie Fey MHA, RDMS, RVT, RDCS  Examination Guidelines: A complete evaluation includes B-mode imaging, spectral Doppler, color Doppler, and power Doppler as needed of all accessible portions of each vessel. Bilateral testing is considered an integral part of a complete examination. Limited examinations for reoccurring indications may be performed as noted. The reflux portion of the exam is performed with the patient in reverse Trendelenburg.  +-----+---------------+---------+-----------+----------+--------------+  RIGHT Compressibility Phasicity Spontaneity Properties Thrombus Aging  +-----+---------------+---------+-----------+----------+--------------+  CFV   Full            Yes       Yes                                    +-----+---------------+---------+-----------+----------+--------------+   +---------+---------------+---------+-----------+----------+--------------+  LEFT      Compressibility Phasicity Spontaneity Properties Thrombus Aging  +---------+---------------+---------+-----------+----------+--------------+  CFV       Full            Yes       Yes                                    +---------+---------------+---------+-----------+----------+--------------+  SFJ       Full                                                              +---------+---------------+---------+-----------+----------+--------------+  FV Prox   Full                                                             +---------+---------------+---------+-----------+----------+--------------+  FV Mid    Full                                                             +---------+---------------+---------+-----------+----------+--------------+  FV Distal Full                                                             +---------+---------------+---------+-----------+----------+--------------+  PFV       Full                                                             +---------+---------------+---------+-----------+----------+--------------+  POP       Full            Yes       Yes                                    +---------+---------------+---------+-----------+----------+--------------+  PTV       Full                                                             +---------+---------------+---------+-----------+----------+--------------+  PERO      Full                                                             +---------+---------------+---------+-----------+----------+--------------+    Summary: RIGHT: - No evidence of common femoral vein obstruction.  LEFT: - There is no evidence of deep vein thrombosis in the lower extremity.  - No cystic structure found in the popliteal fossa. -Pulsatile lower extremity venous flow is suggestive of possibly elevated right heart pressure.  *See table(s) above for measurements and observations. Electronically signed by Gerarda Fraction on 11/29/2021 at 5:49:09 PM.    Final        I have independently reviewed the above radiology studies  and reviewed the findings with the patient.   Recent Lab Findings: Lab Results  Component Value Date   WBC 5.3 12/07/2021   HGB 11.2 12/07/2021   HCT 38.2 12/07/2021   PLT 300 12/07/2021   GLUCOSE 102 (H) 12/07/2021   CHOL 270 (H) 12/07/2021   TRIG 127 12/07/2021   HDL 63 12/07/2021   LDLCALC 184  (H) 12/07/2021   ALT 41 (H) 12/07/2021   AST 20 12/07/2021   NA 142 12/07/2021   K 5.0 12/07/2021   CL 102 12/07/2021   CREATININE 1.24 (H) 12/07/2021   BUN 16 12/07/2021   CO2 25 12/07/2021   TSH 1.500 11/29/2021   INR 1.0 11/29/2021   HGBA1C 5.9 (H) 11/29/2021       Problem List: Anterior mediastinal mass Congestive heart failure with EF 45% Moderate MR  Assessment / Plan:   41 yo female with incidental finding of anterior mediastinal nodular mass.  She also has a hx of congestive heart failure.  She will need tumor markers, and repeat scan in 3 months. Will order PET/CT for that.     I  spent 25 minutes with  the patient face to face in counseling and coordination of care.    Corliss Skains 12/28/2021 10:17 AM

## 2021-12-28 ENCOUNTER — Other Ambulatory Visit: Payer: Self-pay

## 2021-12-28 ENCOUNTER — Institutional Professional Consult (permissible substitution): Payer: BC Managed Care – PPO | Admitting: Thoracic Surgery (Cardiothoracic Vascular Surgery)

## 2021-12-28 VITALS — BP 168/91 | HR 83 | Resp 20 | Ht <= 58 in

## 2021-12-28 DIAGNOSIS — J9859 Other diseases of mediastinum, not elsewhere classified: Secondary | ICD-10-CM

## 2021-12-31 ENCOUNTER — Other Ambulatory Visit: Payer: Self-pay

## 2021-12-31 ENCOUNTER — Ambulatory Visit (HOSPITAL_COMMUNITY)
Admission: RE | Admit: 2021-12-31 | Discharge: 2021-12-31 | Disposition: A | Discharge status: Home / Self Care | Payer: BC Managed Care – PPO | Source: Ambulatory Visit | Attending: Cardiology | Admitting: Cardiology

## 2021-12-31 VITALS — BP 130/90 | HR 75 | Wt 181.4 lb

## 2021-12-31 DIAGNOSIS — I428 Other cardiomyopathies: Secondary | ICD-10-CM | POA: Insufficient documentation

## 2021-12-31 DIAGNOSIS — I340 Nonrheumatic mitral (valve) insufficiency: Secondary | ICD-10-CM | POA: Insufficient documentation

## 2021-12-31 DIAGNOSIS — I2510 Atherosclerotic heart disease of native coronary artery without angina pectoris: Secondary | ICD-10-CM | POA: Insufficient documentation

## 2021-12-31 DIAGNOSIS — R5383 Other fatigue: Secondary | ICD-10-CM | POA: Diagnosis not present

## 2021-12-31 DIAGNOSIS — J90 Pleural effusion, not elsewhere classified: Secondary | ICD-10-CM | POA: Insufficient documentation

## 2021-12-31 DIAGNOSIS — R0683 Snoring: Secondary | ICD-10-CM | POA: Diagnosis not present

## 2021-12-31 DIAGNOSIS — I5022 Chronic systolic (congestive) heart failure: Secondary | ICD-10-CM | POA: Diagnosis not present

## 2021-12-31 DIAGNOSIS — D509 Iron deficiency anemia, unspecified: Secondary | ICD-10-CM | POA: Diagnosis not present

## 2021-12-31 DIAGNOSIS — Z7984 Long term (current) use of oral hypoglycemic drugs: Secondary | ICD-10-CM | POA: Insufficient documentation

## 2021-12-31 DIAGNOSIS — R222 Localized swelling, mass and lump, trunk: Secondary | ICD-10-CM | POA: Insufficient documentation

## 2021-12-31 DIAGNOSIS — I110 Hypertensive heart disease with heart failure: Secondary | ICD-10-CM | POA: Insufficient documentation

## 2021-12-31 DIAGNOSIS — J811 Chronic pulmonary edema: Secondary | ICD-10-CM | POA: Diagnosis not present

## 2021-12-31 DIAGNOSIS — Z79899 Other long term (current) drug therapy: Secondary | ICD-10-CM | POA: Diagnosis not present

## 2021-12-31 LAB — LACTATE DEHYDROGENASE: LDH: 101 U/L (ref 100–200)

## 2021-12-31 LAB — BASIC METABOLIC PANEL
Anion gap: 9 (ref 5–15)
BUN: 15 mg/dL (ref 6–20)
CO2: 24 mmol/L (ref 22–32)
Calcium: 9.1 mg/dL (ref 8.9–10.3)
Chloride: 102 mmol/L (ref 98–111)
Creatinine, Ser: 0.83 mg/dL (ref 0.44–1.00)
GFR, Estimated: >60 mL/min (ref >60)
Glucose, Bld: 84 mg/dL (ref 70–99)
Potassium: 4.3 mmol/L (ref 3.5–5.1)
Sodium: 135 mmol/L (ref 135–145)

## 2021-12-31 LAB — HCG, QUANTITATIVE, PREGNANCY: HCG, Total, QN: <3 m[IU]/mL

## 2021-12-31 LAB — AFP TUMOR MARKER: AFP-Tumor Marker: 1.4 ng/mL

## 2021-12-31 MED ORDER — CARVEDILOL 12.5 MG PO TABS: 12.5000 mg | ORAL_TABLET | Freq: Two times a day (BID) | ORAL | 3 refills | Status: DC

## 2021-12-31 NOTE — Progress Notes (Signed)
?  ?Advanced Heart Failure Clinic Note  ? ?Primary Care: Orion Crook, NP ?HF Cardiologist: Dr. Shirlee Latch ? ?HPI:  ?Alice Hays is a 41 y.o.female with history of HTN and multiple miscarriages (2 ectopic pregnancies + 2 spontaneous abortions), and new diagnosis of mediastinal mass and systolic heart failure.  ?  ?Admitted 11/2021 with new systolic HF. She was markedly hypertensive with SBP 213/140. 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 CTS consulted regarding mediastinal mass. Echo showed EF 45%, RV mildly reduced, moderate MR. She was diuresed with IV Lasix and AHF consulted. Underwent R/LHC showing no significant CAD, NICM, low filling pressures. CMRI showed normal LV size with mild LV hypertrophy, EF of 31% with diffuse hypokinesis, normal RV size with mild systolic dysfunction and EF of 38%, and no definite LGE noted.  She was started on GDMT.  ?  ?She returned to [facility] for post hospital HF follow up with NP on 12/07/21. She was mildly dyspneic with ADLs and with exertion. She was fatigued and reported snoring. Denied palpitations, abnormal bleeding, CP, dizziness, edema, or PND/Orthopnea. Appetite was ok. Denied fever or chills. Weight at home was 178 pounds. Reported taking all medications. Reported working at [employer]. No ETOH/tobacco/ drug use. She has a 35 year old daughter.  ? ?Today she returns to HF clinic for pharmacist medication titration. At last visit with NP, Sherryll Burger was increased to 97-103 mg BID. Overall, patient is feeling pretty good. She reports she is not fully exercising but she is walking more outside without getting tired. She reports she is able to walk halfway around a city block before stopping to catch her breath. This is improved since her last visit. Denies dizziness, lightheadedness and fatigue. Denies chest pain and palpitations.  She reports  overall her breathing has been good as she can talk and breathe at the same time which she was not previously able to do. Weight has been stable at home, 175-180 lbs. She is not on a diuretic.  Denies LEE. Notes she used to wake up and cough at night occasionally, but not since sleeping on 2 pillows. Appetite stable. Adheres to a low salt diet.  ? ? ?HF Medications: ?Carvedilol 6.25 mg BID ?Entresto 97-103 mg BID ?Spironolactone 25 mg daily ?Farxiga 10 mg daily ? ?Has the patient been experiencing any side effects to the medications prescribed?  NO ? ?Does the patient have any problems obtaining medications due to transportation or finances?   NO; BCBS Commercial Plan ? ?Understanding of regimen: good ?Understanding of indications: good ?Potential of compliance: good ?Patient understands to avoid NSAIDs. ?Patient understands to avoid decongestants. ?  ? ?Pertinent Lab Values: ?12/07/21: Serum creatinine 1.24, BUN 16, Potassium 5.0, Sodium 142 ?12/31/21: Serum creatinine  0.83, BUN 15, Potassium 4.3, Sodium 135 ? ?Vital Signs: ?Weight: 181.4 lbs (last clinic weight: 177.4 lbs) ?Blood pressure: 130/90  ?Heart rate: 75  ? ?Assessment/Plan: ?1. Chronic Systolic CHF: Echo this admission with EF 40-45% range with mild LV dilation and mild LVH, moderate MR, moderate LAE, mildly decreased RV systolic function.  She has uncontrolled HTN as a major risk factor.  HIV and ANA negative and TSH normal.  No ETOH or drug history with negative UDS. Spontaneous miscarriages concerning for antiphospholipid antibody syndrome, but when this affects the heart, it generally leads to valvular problems and not cardiomyopathy. No strong FH of CMP.  No significant coronary disease on cath and  filling pressures now normal on RHC after diuresis with normal cardiac output.  ?- Stable NYHA II, and she is not volume overloaded on exam.  ?- She does not appear to need a standing diuretic.   ?- Increase carvedilol to 12.5 mg BID.  ?- Continue Entresto  97/103 mg BID. BMET today stable.  ?- Continue spironolactone 25 mg daily.  ?- Continue Farxiga 10 mg daily. ?2.  HTN:  ?- BP today improved ?- Increase carvedilol as above.  ?3.  H/o multiple miscarriages/spontaneous abortions: ?Antiphospholipid antibody syndrome.  ?- Serologic workup for APLAS negative. ?4. Fe deficiency: Has had IV Fe during recent admission. Recheck iron studies at next clinic visit. 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  ?- CTS is following. Repeat PET/CT scan in 3 months. ?6. Snoring: She also reported daytime fatigue. ?- Nurse left message on 11/2021 for her to complete her sleep study. ? ?Follow up in 2 weeks on 01/17/22 for ECHO and APP clinic.  ? ?Karle Plumber, PharmD, BCPS, BCCP, CPP ?Heart Failure Clinic Pharmacist ?[phone] ?  ?  ?

## 2021-12-31 NOTE — Patient Instructions (Signed)
It was a pleasure seeing you today! ? ?MEDICATIONS: ?-We are changing your medications today ?-Increase carvedilol to 12.5 mg (1 tablet) twice daily. You may take 2 tablets of the 6.25 mg strength twice daily until you pick up the new strength.  ?-Call if you have questions about your medications. ? ?LABS: ?-We will call you if your labs need attention. ? ?NEXT APPOINTMENT: ?Return to clinic in 2 weeks with APP Clinic. ? ?In general, to take care of your heart failure: ?-Limit your fluid intake to 2 Liters (half-gallon) per day.   ?-Limit your salt intake to ideally 2-3 grams (2000-3000 mg) per day. ?-Weigh yourself daily and record, and bring that "weight diary" to your next appointment.  (Weight gain of 2-3 pounds in 1 day typically means fluid weight.) ?-The medications for your heart are to help your heart and help you live longer.   ?-Please contact us before stopping any of your heart medications. ? ?Call the clinic at [phone] with questions or to reschedule future appointments.  ?

## 2022-01-04 ENCOUNTER — Telehealth (HOSPITAL_COMMUNITY): Payer: Self-pay | Admitting: Surgery

## 2022-01-04 NOTE — Telephone Encounter (Signed)
Patient contacted regarding incomplete ordered home sleep study. I left a message for a return call and reinforced the need to perform the study.  ?

## 2022-01-17 ENCOUNTER — Other Ambulatory Visit: Payer: Self-pay

## 2022-01-17 ENCOUNTER — Ambulatory Visit (HOSPITAL_BASED_OUTPATIENT_CLINIC_OR_DEPARTMENT_OTHER)
Admission: RE | Admit: 2022-01-17 | Discharge: 2022-01-17 | Disposition: A | Discharge status: Home / Self Care | Payer: BC Managed Care – PPO | Source: Ambulatory Visit | Attending: Family Medicine | Admitting: Family Medicine

## 2022-01-17 ENCOUNTER — Ambulatory Visit (HOSPITAL_COMMUNITY)
Admission: RE | Admit: 2022-01-17 | Discharge: 2022-01-17 | Disposition: A | Discharge status: Home / Self Care | Payer: BC Managed Care – PPO | Source: Ambulatory Visit | Attending: Adult Health | Admitting: Adult Health

## 2022-01-17 ENCOUNTER — Encounter (INDEPENDENT_AMBULATORY_CARE_PROVIDER_SITE_OTHER): Payer: BC Managed Care – PPO | Admitting: Cardiology

## 2022-01-17 ENCOUNTER — Encounter (HOSPITAL_COMMUNITY): Payer: Self-pay

## 2022-01-17 VITALS — BP 142/80 | HR 78 | Wt 185.0 lb

## 2022-01-17 DIAGNOSIS — I429 Cardiomyopathy, unspecified: Secondary | ICD-10-CM | POA: Diagnosis not present

## 2022-01-17 DIAGNOSIS — I110 Hypertensive heart disease with heart failure: Secondary | ICD-10-CM | POA: Diagnosis not present

## 2022-01-17 DIAGNOSIS — R0683 Snoring: Secondary | ICD-10-CM

## 2022-01-17 DIAGNOSIS — I5022 Chronic systolic (congestive) heart failure: Secondary | ICD-10-CM

## 2022-01-17 DIAGNOSIS — I10 Essential (primary) hypertension: Secondary | ICD-10-CM | POA: Diagnosis not present

## 2022-01-17 DIAGNOSIS — I340 Nonrheumatic mitral (valve) insufficiency: Secondary | ICD-10-CM | POA: Diagnosis not present

## 2022-01-17 DIAGNOSIS — G4733 Obstructive sleep apnea (adult) (pediatric): Secondary | ICD-10-CM

## 2022-01-17 LAB — ECHOCARDIOGRAM COMPLETE
Area-P 1/2: 2.68 cm2
MV M vel: 4.67 m/s
MV Peak grad: 87.2 mmHg
S' Lateral: 3.4 cm

## 2022-01-17 NOTE — Progress Notes (Signed)
? ?ADVANCED HF CLINIC CONSULT NOTE ? ? ?Primary Care: Orion Crook, NP ?HF Cardiologist: Dr. Shirlee Latch ?CT Surgery: Dr Cliffton Asters  ? ?HPI: ?Alice Hays is a 41 y.o.female with history of HTN and multiple miscarriages (2 ectopic pregnancies + 2 spontaneous abortions), and new diagnosis of mediastinal mass and systolic heart failure.  ?  ?Admitted 2/23 with new systolic HF. She was markedly hypertensive with SBP 213/140. Incidentally noted pulmonary edema, small pleural effusions and nodular appearance anterior mediastinal soft tissue density (? Thymic hyperplasia). CTS consulted regarding mediastinal mass. Echo showed EF 45%, RV mildly reduced, moderate MR. She was diuresed with IV lasix and AHF consulted. Underwent R/LHC showing no significant CAD, NICM, low filling pressures. CMRI showed normal LV size with mild LV hypertrophy, EF of 31% with diffuse hypokinesis, normal RV size with mild systolic dysfunction and EF of 38%, and no definite LGE noted.  She was started on GDMT ?  ?Today she returns for HF follow up.Overall feeling fine. Denies SOB/PND/Orthopnea. Walking 40 minutes per day. Wants to go back to work. Appetite ok. No fever or chills. Weight at home has been stable. She is using condoms to prevent pregnancy. Taking all medications but has been out of farxiga and spironolactone.  ?  ?Labs (2/23): K 4.2, creatinine 1.02 ? ?Cardiac Studies: ?- Echo (12/2021): EF 55-60%   ?- Echo (2/23): EF 40-45%, RV mildly reduced, moderate MR. ? ?- R/LHC (2/23): no significant CAD, NICM, low filling pressures, preserved CO. RA mean 1, PA 22/4 (mean 2), CO/CI 5.43/3.21 ? ?- cMRI (2/23): Difficult due to recent Feraheme, but normal LV size with mild LV hypertrophy, EF of 31% with diffuse hypokinesis, normal RV size with mild systolic dysfunction and EF of 38%, and no definite LGE noted.  ? ? ?Past Medical History:  ?Diagnosis Date  ? Anemia   ? Ectopic pregnancy   ? L adnexa  ? Hx of varicella   ? Hypertension    ? ?Current Outpatient Medications  ?Medication Sig Dispense Refill  ? atorvastatin (LIPITOR) 40 MG tablet Take 1 tablet (40 mg total) by mouth daily. 30 tablet 5  ? carvedilol (COREG) 12.5 MG tablet Take 1 tablet (12.5 mg total) by mouth 2 (two) times daily with a meal. 180 tablet 3  ? ferrous sulfate 325 (65 FE) MG tablet Take 1 tablet (325 mg total) by mouth every other day. 30 tablet 0  ? Multiple Vitamin (MV-ONE PO) Take 1 capsule by mouth every morning.    ? sacubitril-valsartan (ENTRESTO) 97-103 MG Take 1 tablet by mouth 2 (two) times daily. 180 tablet 3  ? dapagliflozin propanediol (FARXIGA) 10 MG TABS tablet Take 1 tablet (10 mg total) by mouth daily before breakfast. (Patient not taking: Reported on 01/17/2022) 90 tablet 3  ? spironolactone (ALDACTONE) 25 MG tablet Take 1 tablet (25 mg total) by mouth daily. (Patient not taking: Reported on 01/17/2022) 90 tablet 3  ? ?No current facility-administered medications for this encounter.  ? ?Allergies  ?Allergen Reactions  ? Shellfish Allergy Anaphylaxis  ? ?Social History  ? ?Socioeconomic History  ? Marital status: Married  ?  Spouse name: Not on file  ? Number of children: Not on file  ? Years of education: Not on file  ? Highest education level: Not on file  ?Occupational History  ? Not on file  ?Tobacco Use  ? Smoking status: Never  ? Smokeless tobacco: Never  ?Vaping Use  ? Vaping Use: Never used  ?Substance and Sexual Activity  ?  Alcohol use: No  ? Drug use: No  ? Sexual activity: Yes  ?  Birth control/protection: None  ?Other Topics Concern  ? Not on file  ?Social History Narrative  ? Not on file  ? ?Social Determinants of Health  ? ?Financial Resource Strain: Not on file  ?Food Insecurity: Not on file  ?Transportation Needs: Not on file  ?Physical Activity: Not on file  ?Stress: Not on file  ?Social Connections: Not on file  ?Intimate Partner Violence: Not on file  ? ?Family History  ?Problem Relation Age of Onset  ? Cancer Mother   ? Heart disease Mother    ? Heart disease Father   ? Hypertension Father   ? Stroke Father   ? Cancer Maternal Aunt   ? Diabetes Maternal Grandmother   ? ?Wt Readings from Last 3 Encounters:  ?01/17/22 83.9 kg (185 lb)  ?12/31/21 82.3 kg (181 lb 6.4 oz)  ?12/07/21 80.5 kg (177 lb 6.4 oz)  ? ?BP (!) 142/80   Pulse 78   Wt 83.9 kg (185 lb)   SpO2 99%   BMI 38.67 kg/m?  ? ?PHYSICAL EXAM: ?General:  NAD. No resp difficulty ?HEENT: Normal ?Neck: Supple. No JVD. Carotids 2+ bilat; no bruits. No lymphadenopathy or thryomegaly appreciated. ?Cor: PMI nondisplaced. Regular rate & rhythm. No rubs, gallops or murmurs. ?Lungs: Clear ?Abdomen: Soft, nontender, nondistended. No hepatosplenomegaly. No bruits or masses. Good bowel sounds. ?Extremities: No cyanosis, clubbing, rash, edema ?Neuro: Alert & oriented x 3, cranial nerves grossly intact. Moves all 4 extremities w/o difficulty. Affect pleasant. ? ?ASSESSMENT & PLAN: ?1. Chronic Systolic CHF: Echo this admission with EF 40-45% range with mild LV dilation and mild LVH, moderate MR, moderate LAE, mildly decreased RV systolic function.  She has uncontrolled HTN is a major risk factor.  HIV and ANA negative and TSH normal.  No ETOH or drug history with negative UDS. Spontaneous miscarriages concerning for antiphospholipid antibody syndrome, but when this affects the heart, it generally leads to valvular problems and not cardiomyopathy. No strong FH of CMP.  No significant coronary disease on cath and filling pressures now normal on RHC after diuresis with normal cardiac output.  ?- Discussed avoiding pregnancy.  ?Echo today EF 55-60% RV normal. We discussed results.  ?- NYHA I. Volume status stable.    ?- Continue Entresto to 97/103 mg bid.  ?- Continue spironolactone 25 mg daily.  ?- Continue Coreg  12.5  mg bid.  ?- Continue Farxiga 10 mg daily. ?-Refill farxiga and spironolactone.  ?2.  HTN:  ?Stable  ?3.  H/o multiple miscarriages/spontaneous abortions: ?Antiphospholipid antibody syndrome.  ?-  Serologic workup for APLAS negative. ?4. 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 during recent admission. Recheck iron studies next visit. 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  ?- She has been seen by CT surgery.  ?6. Snoring: Completed home sleep study last night.  ? ?Follow up with Dr Shirlee Latch 4 months. Greater than 50% of the (total minutes 25) visit spent in counseling/coordination of care regarding  the above. Discussed ECHO. Letter provided for return to work.  ? ? ?Eulene Pekar NP_C  ? ?01/17/22 ? ?

## 2022-01-17 NOTE — Patient Instructions (Signed)
Thank you for coming in today ? ?No labs today ?No medication changes  ? ?Your physician recommends that you schedule a follow-up appointment in:  ?3-4 months with Dr. Shirlee Latch ? ?At the [facility], you and your health needs are our priority. As part of our continuing mission to provide you with exceptional heart care, we have created designated Provider Care Teams. These Care Teams include your primary Cardiologist (physician) and Advanced Practice Providers (APPs- Physician Assistants and Nurse Practitioners) who all work together to provide you with the care you need, when you need it.  ? ?You may see any of the following providers on your designated Care Team at your next follow up: ?Dr Arvilla Meres ?Dr Marca Ancona ?Tonye Becket, NP ?Robbie Lis, PA ?Jessica Milford,NP ?Anna Genre, PA ?Karle Plumber, PharmD ? ? ?Please be sure to bring in all your medications bottles to every appointment.  ? ?If you have any questions or concerns before your next appointment please send [place] a message through [place] or call our office at [phone].   ? ?TO LEAVE A MESSAGE FOR THE NURSE SELECT OPTION 2, PLEASE LEAVE A MESSAGE INCLUDING: ?YOUR NAME ?DATE OF BIRTH ?CALL BACK NUMBER ?REASON FOR CALL**this is important as we prioritize the call backs ? ?YOU WILL RECEIVE A CALL BACK THE SAME DAY AS LONG AS YOU CALL BEFORE 4:00 PM ? ?

## 2022-01-18 NOTE — Procedures (Signed)
? ?  Sleep Study Report ?Patient Information ?Study Date: 01/17/22 ?Patient Name: Alice Hays ?Patient ID: [id number] ?Birth Date: May 04, 1981 ?Age: 41 ?Gender: Female ?Referring Physician:Dalton Shirlee Latch, MD ? ?TEST DESCRIPTION: Home sleep apnea testing was completed using the WatchPat, a Type 1 device, utilizing ?peripheral arterial tonometry (PAT), chest movement, actigraphy, pulse oximetry, pulse rate, body position and snore. ?AHI was calculated with apnea and hypopnea using valid sleep time as the denominator. RDI includes apneas, ?hypopneas, and RERAs. The data acquired and the scoring of sleep and all associated events were performed in ?accordance with the recommended standards and specifications as outlined in the AASM Manual for the Scoring of ?Sleep and Associated Events 2.2.0 (2015). ? ?FINDINGS: ?1. Moderate Obstructive Sleep Apnea with AHI 23/hr. ?2. No Central Sleep Apnea with pAHIc 1.1/hr. ?3. Oxygen desaturations as low as 78%. ?4. Moderate snoring was present. O2 sats were < 88% for 7.8 min. ?5. Total sleep time was 3 hrs and 41 min. ?6. 28.3 % of total sleep time was spent in REM sleep. ?7. Normal sleep onset latency at 14 min ?8. Shortened REM sleep onset latency at 58 min. ?9. Total awakenings were 7. ? ?DIAGNOSIS: ?Moderate Obstructive Sleep Apnea (G47.33) ?Nocturnal Hypoxemia ? ?RECOMMENDATIONS: ?1. Clinical correlation of these findings is necessary. The decision to treat obstructive sleep apnea (OSA) is usually ?based on the presence of apnea symptoms or the presence of associated medical conditions such as Hypertension, ?Congestive Heart Failure, Atrial Fibrillation or Obesity. The most common symptoms of OSA are snoring, gasping for ?breath while sleeping, daytime sleepiness and fatigue. ? ?2. Initiating apnea therapy is recommended given the presence of symptoms and/or associated conditions. ?Recommend proceeding with one of the following: ? ? a. Auto-CPAP therapy with a pressure range of  5-20cm H2O. ? ? b. An oral appliance (OA) that can be obtained from certain dentists with expertise in sleep medicine. These are ?primarily of use in non-obese patients with mild and moderate disease. ? c. An ENT consultation which may be useful to look for specific causes of obstruction and possible treatment ?options. ? ? d. If patient is intolerant to PAP therapy, consider referral to ENT for evaluation for hypoglossal nerve stimulator. ? ?3. Close follow-up is necessary to ensure success with CPAP or oral appliance therapy for maximum benefit . ? ?4. A follow-up oximetry study on CPAP is recommended to assess the adequacy of therapy and determine the need ?for supplemental oxygen or the potential need for Bi-level therapy. An arterial blood gas to determine the adequacy of ?baseline ventilation and oxygenation should also be considered. ? ?5. Healthy sleep recommendations include: adequate nightly sleep (normal 7-9 hrs/night), avoidance of caffeine after ?noon and alcohol near bedtime, and maintaining a sleep environment that is cool, dark and quiet. ? ?6. Weight loss for overweight patients is recommended. Even modest amounts of weight loss can significantly ?improve the severity of sleep apnea. ? ?7. Snoring recommendations include: weight loss where appropriate, side sleeping, and avoidance of alcohol before ?bed. ? ?8. Operation of motor vehicle should not be performed when sleepy. ? ?Signature: ?Electronically Signed: 01/18/22 ?Armanda Magic, MD; [facility]; Diplomat, American Board of Sleep Medicine ? ?

## 2022-01-21 ENCOUNTER — Ambulatory Visit: Payer: BC Managed Care – PPO

## 2022-01-21 DIAGNOSIS — I429 Cardiomyopathy, unspecified: Secondary | ICD-10-CM

## 2022-01-21 DIAGNOSIS — G4730 Sleep apnea, unspecified: Secondary | ICD-10-CM

## 2022-01-22 ENCOUNTER — Telehealth: Payer: Self-pay | Admitting: *Deleted

## 2022-01-22 NOTE — Telephone Encounter (Signed)
-----   Message from Quintella Reichert, MD sent at 01/18/2022  3:34 PM EDT ----- ?Please let patient know that they have sleep apnea.  Recommend therapeutic CPAP titration for treatment of patient's sleep disordered breathing.  If unable to perform an in lab titration then initiate ResMed auto CPAP from 4 to 15cm H2O with heated humidity and mask of choice and overnight pulse ox on CPAP.    ?

## 2022-01-22 NOTE — Telephone Encounter (Signed)
Message left to return a call to discuss sleep study results and recommendations. 

## 2022-01-29 NOTE — Telephone Encounter (Signed)
Left message to return a call to discuss sleep study results and recommendations. 

## 2022-02-07 NOTE — Telephone Encounter (Signed)
Left 3rd and final message to return a call to discuss HST results and recommendations. I will also send a Mychart message. ?

## 2022-03-06 ENCOUNTER — Other Ambulatory Visit: Payer: Self-pay | Admitting: Thoracic Surgery (Cardiothoracic Vascular Surgery)

## 2022-03-06 ENCOUNTER — Ambulatory Visit: Payer: Self-pay | Admitting: Nurse Practitioner

## 2022-03-06 DIAGNOSIS — J9859 Other diseases of mediastinum, not elsewhere classified: Secondary | ICD-10-CM

## 2022-03-20 ENCOUNTER — Encounter (HOSPITAL_COMMUNITY)
Admission: RE | Admit: 2022-03-20 | Discharge: 2022-03-20 | Disposition: A | Discharge status: Home / Self Care | Payer: BC Managed Care – PPO | Source: Ambulatory Visit | Attending: Thoracic Surgery (Cardiothoracic Vascular Surgery) | Admitting: Thoracic Surgery (Cardiothoracic Vascular Surgery)

## 2022-03-20 DIAGNOSIS — K769 Liver disease, unspecified: Secondary | ICD-10-CM | POA: Diagnosis not present

## 2022-03-20 DIAGNOSIS — N281 Cyst of kidney, acquired: Secondary | ICD-10-CM | POA: Diagnosis not present

## 2022-03-20 DIAGNOSIS — R222 Localized swelling, mass and lump, trunk: Secondary | ICD-10-CM | POA: Diagnosis not present

## 2022-03-20 DIAGNOSIS — Z8709 Personal history of other diseases of the respiratory system: Secondary | ICD-10-CM | POA: Diagnosis not present

## 2022-03-20 DIAGNOSIS — J9859 Other diseases of mediastinum, not elsewhere classified: Secondary | ICD-10-CM | POA: Insufficient documentation

## 2022-03-20 LAB — GLUCOSE, CAPILLARY: Glucose-Capillary: 110 mg/dL — ABNORMAL HIGH (ref 70–99)

## 2022-03-20 MED ORDER — FLUDEOXYGLUCOSE F - 18 (FDG) INJECTION
9.3300 | Freq: Once | INTRAVENOUS | Status: AC
  Administered 2022-03-20: 9.33 via INTRAVENOUS

## 2022-03-26 ENCOUNTER — Ambulatory Visit (HOSPITAL_COMMUNITY)
Admission: RE | Admit: 2022-03-26 | Discharge: 2022-03-26 | Disposition: A | Discharge status: Home / Self Care | Payer: BC Managed Care – PPO | Source: Ambulatory Visit | Attending: Cardiology | Admitting: Cardiology

## 2022-03-26 ENCOUNTER — Encounter (HOSPITAL_COMMUNITY): Payer: Self-pay | Admitting: Cardiology

## 2022-03-26 VITALS — BP 170/100 | HR 84 | Wt 184.0 lb

## 2022-03-26 DIAGNOSIS — I110 Hypertensive heart disease with heart failure: Secondary | ICD-10-CM | POA: Insufficient documentation

## 2022-03-26 DIAGNOSIS — E782 Mixed hyperlipidemia: Secondary | ICD-10-CM | POA: Diagnosis not present

## 2022-03-26 DIAGNOSIS — G4733 Obstructive sleep apnea (adult) (pediatric): Secondary | ICD-10-CM | POA: Insufficient documentation

## 2022-03-26 DIAGNOSIS — I10 Essential (primary) hypertension: Secondary | ICD-10-CM | POA: Diagnosis not present

## 2022-03-26 DIAGNOSIS — E785 Hyperlipidemia, unspecified: Secondary | ICD-10-CM | POA: Insufficient documentation

## 2022-03-26 DIAGNOSIS — R222 Localized swelling, mass and lump, trunk: Secondary | ICD-10-CM | POA: Diagnosis not present

## 2022-03-26 DIAGNOSIS — J811 Chronic pulmonary edema: Secondary | ICD-10-CM | POA: Diagnosis not present

## 2022-03-26 DIAGNOSIS — Z79899 Other long term (current) drug therapy: Secondary | ICD-10-CM | POA: Insufficient documentation

## 2022-03-26 DIAGNOSIS — I5022 Chronic systolic (congestive) heart failure: Secondary | ICD-10-CM

## 2022-03-26 LAB — COMPREHENSIVE METABOLIC PANEL
ALT: 13 U/L (ref 0–44)
AST: 18 U/L (ref 15–41)
Albumin: 3.6 g/dL (ref 3.5–5.0)
Alkaline Phosphatase: 56 U/L (ref 38–126)
Anion gap: 7 (ref 5–15)
BUN: 11 mg/dL (ref 6–20)
CO2: 26 mmol/L (ref 22–32)
Calcium: 9.5 mg/dL (ref 8.9–10.3)
Chloride: 104 mmol/L (ref 98–111)
Creatinine, Ser: 0.74 mg/dL (ref 0.44–1.00)
GFR, Estimated: >60 mL/min (ref >60)
Glucose, Bld: 90 mg/dL (ref 70–99)
Potassium: 4.1 mmol/L (ref 3.5–5.1)
Sodium: 137 mmol/L (ref 135–145)
Total Bilirubin: 0.3 mg/dL (ref 0.3–1.2)
Total Protein: 7.1 g/dL (ref 6.5–8.1)

## 2022-03-26 LAB — LIPID PANEL
Cholesterol: 219 mg/dL — ABNORMAL HIGH (ref 0–200)
HDL: 55 mg/dL (ref >40)
LDL Cholesterol: 146 mg/dL — ABNORMAL HIGH (ref 0–99)
Total CHOL/HDL Ratio: 4 RATIO
Triglycerides: 89 mg/dL (ref <150)
VLDL: 18 mg/dL (ref 0–40)

## 2022-03-26 MED ORDER — AMLODIPINE BESYLATE 5 MG PO TABS: 5.0000 mg | ORAL_TABLET | Freq: Every day | ORAL | 3 refills | Status: DC

## 2022-03-26 MED ORDER — CARVEDILOL 12.5 MG PO TABS: 18.7500 mg | ORAL_TABLET | Freq: Two times a day (BID) | ORAL | 3 refills | Status: DC

## 2022-03-26 NOTE — Patient Instructions (Signed)
Medication Changes:  Start amlodipine 5mg  daily   Increase Carvedilol to 18.75mg  (1 1/2 Tab ) Twice daily   Lab Work:  Labs done today, your results will be available in MyChart, we will contact you for abnormal readings.   Testing/Procedures:  Your provider wants you to have a renal dopplers. You will be called to schedule the appointment by the office at [facility].  Referrals:  Please follow up with our heart failure pharmacist in 3 weeks   Special Instructions // Education:  none  Follow-Up in: 6 weeks   At the [facility], you and your health needs are our priority. We have a designated team specialized in the treatment of Heart Failure. This Care Team includes your primary Heart Failure Specialized Cardiologist (physician), Advanced Practice Providers (APPs- Physician Assistants and Nurse Practitioners), and Pharmacist who all work together to provide you with the care you need, when you need it.   You may see any of the following providers on your designated Care Team at your next follow up:  Dr Glori Bickers Dr Haynes Kerns, NP Lyda Jester, [place] [facility] [place], [place] Audry Riles, PharmD   Please be sure to bring in all your medications bottles to every appointment.   Need to Contact us:  If you have any questions or concerns before your next appointment please send [place] a message through [place] or call our office at [phone].    TO LEAVE A MESSAGE FOR THE NURSE SELECT OPTION 2, PLEASE LEAVE A MESSAGE INCLUDING: YOUR NAME DATE OF BIRTH CALL BACK NUMBER REASON FOR CALL**this is important as we prioritize the call backs  YOU WILL RECEIVE A CALL BACK THE SAME DAY AS LONG AS YOU CALL BEFORE 4:00 PM

## 2022-03-27 NOTE — Progress Notes (Signed)
ADVANCED HF CLINIC CONSULT NOTE   Primary Care: Bo Merino, NP HF Cardiologist: Dr. Aundra Dubin CT Surgery: Dr Kipp Brood   HPI: Alice Hays is a 41 y.o.female with history of HTN and multiple miscarriages (2 ectopic pregnancies + 2 spontaneous abortions), and diagnosis of mediastinal mass and systolic heart failure.    Admitted 0000000 with new systolic HF. She was markedly hypertensive with SBP 213/140. Incidentally noted pulmonary edema, small pleural effusions and nodular appearance anterior mediastinal soft tissue density (? Thymic hyperplasia). CTS consulted regarding mediastinal mass. Echo showed EF 45%, RV mildly reduced, moderate MR. She was diuresed with IV lasix and AHF consulted. Underwent R/LHC showing no significant CAD (NICM), low filling pressures. CMRI showed normal LV size with mild LV hypertrophy, EF of 31% with diffuse hypokinesis, normal RV size with mild systolic dysfunction and EF of 38%, and no definite LGE noted.  She was started on GDMT.   Echo in 3/23 showed EF up to 55-60%, normal RV, normal IVC.    Today she returns for HF followup.  BP is elevated at 170/100.  No significant exertional dyspnea.  She has been trying to so some walking for exercise.  No lightheadedness.  No chest pain.     Labs (2/23): K 4.2, creatinine 1.02, LDL 184 Labs (3/23): K 4.3, creatinine 0.83  ECG (personally reviewed): NSR, LAE  PMH: 1. Chronic systolic CHF: Nonischemic cardiomyopathy, ?due to uncontrolled HTN.  - Echo (2/23): EF 40-45%, RV mildly reduced, moderate MR. - R/LHC (2/23): no significant CAD, NICM, low filling pressures, preserved CO. RA mean 1, PA 22/4 (mean 2), CO/CI 5.43/3.21 - cMRI (2/23): Difficult due to recent Feraheme, but normal LV size with mild LV hypertrophy, EF of 31% with diffuse hypokinesis, normal RV size with mild systolic dysfunction and EF of 38%, and no definite LGE noted.  - Echo (3/23): EF 55-60%, normal RV.  2. HTN 3. OSA 4. Fe deficiency  anemia 5. 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 90 tablet 3   atorvastatin (LIPITOR) 40 MG tablet Take 1 tablet (40 mg total) by mouth daily. 30 tablet 5   dapagliflozin propanediol (FARXIGA) 10 MG TABS tablet Take 1 tablet (10 mg total) by mouth daily before breakfast. 90 tablet 3   ferrous sulfate 325 (65 FE) MG tablet Take 1 tablet (325 mg total) by mouth every other day. 30 tablet 0   Multiple Vitamin (MV-ONE PO) Take 1 capsule by mouth every morning.     sacubitril-valsartan (ENTRESTO) 97-103 MG Take 1 tablet by mouth 2 (two) times daily. 180 tablet 3   spironolactone (ALDACTONE) 25 MG tablet Take 1 tablet (25 mg total) by mouth daily. 90 tablet 3   carvedilol (COREG) 12.5 MG tablet Take 1.5 tablets (18.75 mg total) by mouth 2 (two) times daily with a meal. 200 tablet 3   No current facility-administered medications for this encounter.   Allergies  Allergen Reactions   Shellfish Allergy Anaphylaxis   Social History   Socioeconomic History   Marital status: Married    Spouse name: Not on file   Number of children: Not on file   Years of education: Not on file   Highest education level: Not on file  Occupational History   Not on file  Tobacco Use   Smoking status: Never   Smokeless tobacco: Never  Vaping Use   Vaping  Use: Never used  Substance and Sexual Activity   Alcohol use: No   Drug use: No   Sexual activity: Yes    Birth control/protection: None  Other Topics Concern   Not on file  Social History Narrative   Not on file   Social Determinants of Health   Financial Resource Strain: Not on file  Food Insecurity: Not on file  Transportation Needs: Not on file  Physical Activity: Not on file  Stress: Not on file  Social Connections: Not on file  Intimate Partner Violence: Not on file   Family History  Problem Relation  Age of Onset   Cancer Mother    Heart disease Mother    Heart disease Father    Hypertension Father    Stroke Father    Cancer Maternal Aunt    Diabetes Maternal Grandmother    Wt Readings from Last 3 Encounters:  03/26/22 83.5 kg (184 lb)  01/17/22 83.9 kg (185 lb)  12/31/21 82.3 kg (181 lb 6.4 oz)   BP (!) 170/100   Pulse 84   Wt 83.5 kg (184 lb)   LMP 03/18/2022 (Approximate)   SpO2 99%   BMI 38.46 kg/m   PHYSICAL EXAM: General: NAD Neck: No JVD, no thyromegaly or thyroid nodule.  Lungs: Clear to auscultation bilaterally with normal respiratory effort. CV: Nondisplaced PMI.  Heart regular S1/S2, no S3/S4, no murmur.  No peripheral edema.  No carotid bruit.  Normal pedal pulses.  Abdomen: Soft, nontender, no hepatosplenomegaly, no distention.  Skin: Intact without lesions or rashes.  Neurologic: Alert and oriented x 3.  Psych: Normal affect. Extremities: No clubbing or cyanosis.  HEENT: Normal.   ASSESSMENT & PLAN: 1. Chronic Systolic CHF: Echo in 0000000 with EF 40-45% range with mild LV dilation and mild LVH, moderate MR, moderate LAE, mildly decreased RV systolic function.  She has uncontrolled HTN as a major risk factor.  HIV and ANA negative and TSH normal.  No ETOH or drug history with negative UDS. Spontaneous miscarriages concerning for antiphospholipid antibody syndrome, but when this affects the heart, it generally leads to valvular problems and not cardiomyopathy => serologic workup negative for APLAS. No strong FH of CMP.  No significant coronary disease on cath.  Cardiac MRI in 2/23 showed normal LV size with mild LV hypertrophy, EF of 31% with diffuse hypokinesis, normal RV size with mild systolic dysfunction and EF of 38%, and no definite LGE noted.  With medical therapy, echo in 3/23 showed EF up to 55-60%.  She is not volume overloaded on exam, NYHA class I-II.  - Continue Entresto 97/103 mg bid.  - Continue spironolactone 25 mg daily.  - Increase Coreg to 18.75  mg bid with elevated BP still.  - Continue Farxiga 10 mg daily. - Discussed avoidance of pregnancy with current meds.  2.  HTN: Still not controlled.  - Increase Coreg to 18.75 mg bid - Add amlodipine 5 mg daily.  - Arrange for renal artery dopplers to assess for fibromuscular dysplasia.   - Needs CPAP for treatment of OSA.  3.  H/o multiple miscarriages/spontaneous abortions: ?Antiphospholipid antibody syndrome => serologic workup for APLAS negative. 4. 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  - She has been seen by CT surgery and will be following with Dr. Kipp Brood.  5. OSA: Needs CPAP titration.    Followup 3 wks HF pharmacy for BP med titration.  Followup APP 2 months.   Loralie Champagne 03/27/2022

## 2022-04-02 NOTE — Progress Notes (Signed)
Advanced Heart Failure Clinic Note   Primary Care: Orion Crook, NP HF Cardiologist: Dr. 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    Admitted 11/2021 with new systolic HF. She was markedly hypertensive with SBP 213/140. 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 CTS consulted regarding mediastinal mass. Echo showed EF 45%, RV mildly reduced, moderate MR. She was diuresed with IV Lasix and AHF consulted. Underwent R/LHC showing no significant CAD, NICM, low filling pressures. CMRI showed normal LV size with mild LV hypertrophy, EF of 31% with diffuse hypokinesis, normal RV size with mild systolic dysfunction and EF of 38%, and no definite LGE noted.  She was started on GDMT.   Echo in 12/2021 showed EF up to 55-60%, normal RV, normal IVC.   She returned to [facility] for HF follow up on 03/26/22.  BP was elevated at 170/100.  No significant exertional dyspnea.  She had been trying to so some walking for exercise.  No lightheadedness.  No chest pain.    Today she returns to HF clinic for pharmacist medication titration. At last visit with MD, carvedilol was increased to 18.75 mg daily and amlodipine 5 mg daily was initiated.  Overall she is feeling well. No dizziness, lightheadedness, CP or palpitations. No SOB/DOE. Has been walking 4 laps around her neighborhood for exercise. Weight has been stable at 185 lbs. No LEE, PND or orthopnea. BP in clinic elevated at 166/102. BP repeated 15 minutes later, 160/100. States BP on her home machine has been ~125-130/70-80s.    HF Medications: Carvedilol 18.75 mg BID Entresto 97-103 mg BID Spironolactone 25 mg daily Farxiga 10 mg daily  Has the patient been  experiencing any side effects to the medications prescribed?  NO  Does the patient have any problems obtaining medications due to transportation or finances?   NO; Product manager  Understanding of regimen: good Understanding of indications: good Potential of compliance: good Patient understands to avoid NSAIDs. Patient understands to avoid decongestants.    Pertinent Lab Values: 03/26/22: Serum creatinine 0.74, BUN 11, Potassium 4.1, Sodium 137  Vital Signs:  Weight: 185 lbs (last clinic weight: 184 lbs) Blood pressure: 160/100  Heart rate: 85   Assessment/Plan: 1. Chronic Systolic CHF: Echo in 11/2021 with EF 40-45% range with mild LV dilation and mild LVH, moderate MR, moderate LAE, mildly decreased RV systolic function.  She has uncontrolled HTN as a major risk factor.  HIV and ANA negative and TSH normal.  No ETOH or drug history with negative UDS. Spontaneous miscarriages concerning for antiphospholipid antibody syndrome, but when this affects the heart, it generally leads to valvular problems and not cardiomyopathy => serologic workup negative for APLAS. No strong FH of CMP.  No significant coronary disease on cath.  Cardiac MRI in 11/2021 showed normal LV size with mild LV hypertrophy, EF of 31% with diffuse hypokinesis, normal RV size with mild systolic dysfunction and EF of 38%, and no definite LGE noted.  With medical therapy, echo in 12/2021 showed EF up to 55-60%. - Stable NYHA II, and she is not volume overloaded on exam.  - She does not appear to need a standing diuretic.   - Increase carvedilol to 25 mg BID.  - Continue Entresto 97/103 mg BID. -  Continue spironolactone 25 mg daily.  - Continue Farxiga 10 mg daily. - Discussed avoidance of pregnancy with current meds.  2.  HTN: Still not controlled.  - Increase amlodipine to 10 mg daily.  - renal artery dopplers showed no clinically significant renal artery stenosis - Needs CPAP for treatment of OSA.  - Will bring home  BP machine to next visit to make sure it is accurate.  - Consider increasing spironolactone to 50 mg daily for resistant hypertension or adding hydrochlorothiazide next visit if BP remains elevated.  3.  H/o multiple miscarriages/spontaneous abortions: ?Antiphospholipid antibody syndrome => serologic workup for APLAS negative. 4. 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  - She has been seen by CT surgery and will be following with Dr. Cliffton Asters.  5. OSA: Needs CPAP titration.    Follow up 3 weeks with [facility]   Karle Plumber, PharmD, BCPS, BCCP, CPP Heart Failure Clinic Pharmacist [phone]

## 2022-04-03 ENCOUNTER — Telehealth (HOSPITAL_COMMUNITY): Payer: Self-pay

## 2022-04-03 DIAGNOSIS — E7849 Other hyperlipidemia: Secondary | ICD-10-CM

## 2022-04-03 MED ORDER — ATORVASTATIN CALCIUM 80 MG PO TABS: 80.0000 mg | ORAL_TABLET | Freq: Every day | ORAL | 5 refills | Status: DC

## 2022-04-03 NOTE — Telephone Encounter (Signed)
-----   Message from Laurey Morale, MD sent at 03/28/2022  1:08 PM EDT ----- Is she taking atorvastatin 40 mg daily? If so, increase to 80 mg daily. Lipids/LFTs in 2 months.

## 2022-04-03 NOTE — Telephone Encounter (Signed)
Patient confirms that she has been taking atorvastatin 40mg  QD. Patient advised and verbalized understanding, will increase to 80mg  QD. Will recheck bloodwork at next visit.    Meds ordered this encounter  Medications   atorvastatin (LIPITOR) 80 MG tablet    Sig: Take 1 tablet (80 mg total) by mouth daily.    Dispense:  30 tablet    Refill:  5    Please cancel all previous orders for current medication. Change in dosage or pill size.   Orders Placed This Encounter  Procedures   Lipid panel    Standing Status:   Future    Standing Expiration Date:   04/04/2023    Order Specific Question:   Release to patient    Answer:   Immediate   Hepatic function panel    Standing Status:   Future    Standing Expiration Date:   04/04/2023    Order Specific Question:   Release to patient    Answer:   Immediate

## 2022-04-11 ENCOUNTER — Ambulatory Visit (HOSPITAL_COMMUNITY)
Admission: RE | Admit: 2022-04-11 | Discharge: 2022-04-11 | Disposition: A | Discharge status: Home / Self Care | Payer: BC Managed Care – PPO | Source: Ambulatory Visit | Attending: Internal Medicine | Admitting: Internal Medicine

## 2022-04-11 DIAGNOSIS — I10 Essential (primary) hypertension: Secondary | ICD-10-CM | POA: Diagnosis not present

## 2022-04-17 ENCOUNTER — Ambulatory Visit (HOSPITAL_COMMUNITY)
Admission: RE | Admit: 2022-04-17 | Discharge: 2022-04-17 | Disposition: A | Discharge status: Home / Self Care | Payer: BC Managed Care – PPO | Source: Ambulatory Visit | Attending: Internal Medicine | Admitting: Internal Medicine

## 2022-04-17 VITALS — BP 160/100 | HR 85 | Wt 185.0 lb

## 2022-04-17 DIAGNOSIS — Z79899 Other long term (current) drug therapy: Secondary | ICD-10-CM | POA: Insufficient documentation

## 2022-04-17 DIAGNOSIS — Z7902 Long term (current) use of antithrombotics/antiplatelets: Secondary | ICD-10-CM | POA: Diagnosis not present

## 2022-04-17 DIAGNOSIS — G4733 Obstructive sleep apnea (adult) (pediatric): Secondary | ICD-10-CM | POA: Diagnosis not present

## 2022-04-17 DIAGNOSIS — Z7901 Long term (current) use of anticoagulants: Secondary | ICD-10-CM | POA: Diagnosis not present

## 2022-04-17 DIAGNOSIS — Z7984 Long term (current) use of oral hypoglycemic drugs: Secondary | ICD-10-CM | POA: Diagnosis not present

## 2022-04-17 DIAGNOSIS — I110 Hypertensive heart disease with heart failure: Secondary | ICD-10-CM | POA: Insufficient documentation

## 2022-04-17 DIAGNOSIS — I5022 Chronic systolic (congestive) heart failure: Secondary | ICD-10-CM

## 2022-04-17 DIAGNOSIS — N96 Recurrent pregnancy loss: Secondary | ICD-10-CM | POA: Diagnosis not present

## 2022-04-17 MED ORDER — AMLODIPINE BESYLATE 10 MG PO TABS: 10.0000 mg | ORAL_TABLET | Freq: Every day | ORAL | 3 refills | Status: DC

## 2022-04-17 MED ORDER — CARVEDILOL 25 MG PO TABS: 25.0000 mg | ORAL_TABLET | Freq: Two times a day (BID) | ORAL | 3 refills | Status: DC

## 2022-04-17 NOTE — Patient Instructions (Signed)
It was a pleasure seeing you today!  MEDICATIONS: -We are changing your medications today -Increase carvedilol to 25 mg (1 tablet) twice daily -Increase amlodipine to 10 mg (1 tablet) daily -Call if you have questions about your medications.   NEXT APPOINTMENT: Return to clinic in 3 weeks with [facility].  In general, to take care of your heart failure: -Limit your fluid intake to 2 Liters (half-gallon) per day.   -Limit your salt intake to ideally 2-3 grams (2000-3000 mg) per day. -Weigh yourself daily and record, and bring that "weight diary" to your next appointment.  (Weight gain of 2-3 pounds in 1 day typically means fluid weight.) -The medications for your heart are to help your heart and help you live longer.   -Please contact us before stopping any of your heart medications.  Call the clinic at [phone] with questions or to reschedule future appointments.

## 2022-04-19 ENCOUNTER — Ambulatory Visit: Payer: BC Managed Care – PPO | Admitting: Thoracic Surgery (Cardiothoracic Vascular Surgery)

## 2022-04-22 ENCOUNTER — Encounter: Payer: Self-pay | Admitting: Thoracic Surgery (Cardiothoracic Vascular Surgery)

## 2022-05-10 ENCOUNTER — Encounter (HOSPITAL_COMMUNITY): Payer: BC Managed Care – PPO

## 2022-05-28 ENCOUNTER — Telehealth (HOSPITAL_COMMUNITY): Payer: Self-pay

## 2022-05-28 NOTE — Telephone Encounter (Signed)
Called and left patient a detailed message  to confirm/remind patient of their appointment at the Advanced Heart Failure Clinic on 05/29/22.   Patient reminded to bring all medications and/or complete list.  Confirmed patient has transportation. Gave directions, instructed to utilize valet parking.  Confirmed appointment prior to ending call.

## 2022-05-29 ENCOUNTER — Encounter (HOSPITAL_COMMUNITY): Payer: BC Managed Care – PPO

## 2022-06-24 ENCOUNTER — Telehealth (HOSPITAL_COMMUNITY): Payer: Self-pay

## 2022-06-24 NOTE — Telephone Encounter (Signed)
Called and left patient a voice message to confirm/remind patient of their appointment at the Advanced Heart Failure Clinic on 06/25/22.  .   

## 2022-06-25 ENCOUNTER — Ambulatory Visit (HOSPITAL_COMMUNITY)
Admission: RE | Admit: 2022-06-25 | Discharge: 2022-06-25 | Disposition: A | Discharge status: Home / Self Care | Payer: BC Managed Care – PPO | Source: Ambulatory Visit | Attending: Family Medicine | Admitting: Family Medicine

## 2022-06-25 ENCOUNTER — Encounter (HOSPITAL_COMMUNITY): Payer: Self-pay

## 2022-06-25 ENCOUNTER — Telehealth: Payer: Self-pay | Admitting: *Deleted

## 2022-06-25 VITALS — BP 192/120 | HR 92 | Wt 184.6 lb

## 2022-06-25 DIAGNOSIS — G4733 Obstructive sleep apnea (adult) (pediatric): Secondary | ICD-10-CM | POA: Insufficient documentation

## 2022-06-25 DIAGNOSIS — I110 Hypertensive heart disease with heart failure: Secondary | ICD-10-CM | POA: Insufficient documentation

## 2022-06-25 DIAGNOSIS — I10 Essential (primary) hypertension: Secondary | ICD-10-CM | POA: Diagnosis not present

## 2022-06-25 DIAGNOSIS — Z7984 Long term (current) use of oral hypoglycemic drugs: Secondary | ICD-10-CM | POA: Diagnosis not present

## 2022-06-25 DIAGNOSIS — I5022 Chronic systolic (congestive) heart failure: Secondary | ICD-10-CM | POA: Diagnosis not present

## 2022-06-25 DIAGNOSIS — R222 Localized swelling, mass and lump, trunk: Secondary | ICD-10-CM | POA: Insufficient documentation

## 2022-06-25 DIAGNOSIS — Z79899 Other long term (current) drug therapy: Secondary | ICD-10-CM | POA: Insufficient documentation

## 2022-06-25 DIAGNOSIS — J9859 Other diseases of mediastinum, not elsewhere classified: Secondary | ICD-10-CM

## 2022-06-25 DIAGNOSIS — E785 Hyperlipidemia, unspecified: Secondary | ICD-10-CM | POA: Diagnosis not present

## 2022-06-25 LAB — BASIC METABOLIC PANEL
Anion gap: 8 (ref 5–15)
BUN: 12 mg/dL (ref 6–20)
CO2: 24 mmol/L (ref 22–32)
Calcium: 9.4 mg/dL (ref 8.9–10.3)
Chloride: 107 mmol/L (ref 98–111)
Creatinine, Ser: 0.67 mg/dL (ref 0.44–1.00)
GFR, Estimated: >60 mL/min (ref >60)
Glucose, Bld: 93 mg/dL (ref 70–99)
Potassium: 3.3 mmol/L — ABNORMAL LOW (ref 3.5–5.1)
Sodium: 139 mmol/L (ref 135–145)

## 2022-06-25 MED ORDER — ISOSORB DINITRATE-HYDRALAZINE 20-37.5 MG PO TABS: 1.0000 | ORAL_TABLET | Freq: Three times a day (TID) | ORAL | 11 refills | Status: DC

## 2022-06-25 NOTE — Patient Instructions (Addendum)
START Bidil ,1 tab three times daily  Labs today We will only contact you if something comes back abnormal or we need to make some changes. Otherwise no news is good news!  Your physician has requested that you regularly monitor and record your blood pressure readings at home. Please use the same machine at the same time of day to check your readings and record them to bring to your follow-up visit.  You have been referred to [facility] -they will be in contact with an appointment  Please contact Dr Malachy Mood office to discuss your sleep study results Phone [phone].  Be sure to contact Dr Lucilla Lame office to reschedule appointment Phone [phone]   Your physician recommends that you schedule a follow-up appointment in: 3 months with Dr Shirlee Latch  Do the following things EVERYDAY: Weigh yourself in the morning before breakfast. Write it down and keep it in a log. Take your medicines as prescribed Eat low salt foods--Limit salt (sodium) to 2000 mg per day.  Stay as active as you can everyday Limit all fluids for the day to less than 2 liters   At the Advanced Heart Failure Clinic, you and your health needs are our priority. As part of our continuing mission to provide you with exceptional heart care, we have created designated Provider Care Teams. These Care Teams include your primary Cardiologist (physician) and Advanced Practice Providers (APPs- Physician Assistants and Nurse Practitioners) who all work together to provide you with the care you need, when you need it.   You may see any of the following providers on your designated Care Team at your next follow up: Dr Arvilla Meres Dr Marca Ancona Dr. Marcos Eke, NP Robbie Lis, [place] [facility] [place], [place] Brynda Peon, NP Karle Plumber, PharmD   Please be sure to bring in all your medications bottles to every appointment.

## 2022-06-25 NOTE — Telephone Encounter (Signed)
From Michaelyn Barter, We sent her over for CPAP followup with Dr Mayford Knife. she did not answer the phone initially but she verified her phone number and is willing to schedule should I send another referral or can I message a particular scheduling person to get her an appt? Me, I have put the order in for the titration and todays note says she needs a titration so now it will be precerted and scheduled if approved.

## 2022-06-25 NOTE — Progress Notes (Signed)
Advanced Heart Failure Clinic Note  Primary Care: Orion Crook, NP CT Surgery: Dr Cliffton Asters  HF Cardiologist: Dr. Shirlee Latch  HPI: Alice Hays is a 41 y.o.female with history of HTN and multiple miscarriages (2 ectopic pregnancies + 2 spontaneous abortions), and diagnosis of mediastinal mass and systolic heart failure.    Admitted 2/23 with new systolic HF. She was markedly hypertensive with SBP 213/140. Incidentally noted pulmonary edema, small pleural effusions and nodular appearance anterior mediastinal soft tissue density (? Thymic hyperplasia). CTS consulted regarding mediastinal mass. Echo showed EF 45%, RV mildly reduced, moderate MR. She was diuresed with IV lasix and AHF consulted. Underwent R/LHC showing no significant CAD (NICM), low filling pressures. CMRI showed normal LV size with mild LV hypertrophy, EF of 31% with diffuse hypokinesis, normal RV size with mild systolic dysfunction and EF of 38%, and no definite LGE noted.  She was started on GDMT.   Echo in 3/23 showed EF up to 55-60%, normal RV, normal IVC.   Today she returns for HF follow up. Overall feeling fine. She does not have significant exertional dyspnea. She does not have a CPAP Denies increasing SOB, CP, dizziness, edema, or PND/Orthopnea. Appetite ok. No fever or chills. Weight at home 183-185 pounds. Taking all medications.     Labs (2/23): K 4.2, creatinine 1.02, LDL 184 Labs (3/23): K 4.3, creatinine 0.83 Labs (5/23): K 4.1, creatinine 0.74, LDL 146, HDL 55  ECG (personally reviewed): none ordered today.  PMH: 1. Chronic systolic CHF: Nonischemic cardiomyopathy, ?due to uncontrolled HTN.  - Echo (2/23): EF 40-45%, RV mildly reduced, moderate MR. - R/LHC (2/23): no significant CAD, NICM, low filling pressures, preserved CO. RA mean 1, PA 22/4 (mean 2), CO/CI 5.43/3.21 - cMRI (2/23): Difficult due to recent Feraheme, but normal LV size with mild LV hypertrophy, EF of 31% with diffuse hypokinesis, normal  RV size with mild systolic dysfunction and EF of 38%, and no definite LGE noted.  - Echo (3/23): EF 55-60%, normal RV.  2. HTN 3. OSA 4. Fe deficiency anemia 5. 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 90 tablet 3   atorvastatin (LIPITOR) 80 MG tablet Take 1 tablet (80 mg total) by mouth daily. 30 tablet 5   carvedilol (COREG) 25 MG tablet Take 1 tablet (25 mg total) by mouth 2 (two) times daily with a meal. 180 tablet 3   dapagliflozin propanediol (FARXIGA) 10 MG TABS tablet Take 1 tablet (10 mg total) by mouth daily before breakfast. 90 tablet 3   ferrous sulfate 325 (65 FE) MG tablet Take 1 tablet (325 mg total) by mouth every other day. 30 tablet 0   Multiple Vitamin (MV-ONE PO) Take 1 capsule by mouth every morning.     sacubitril-valsartan (ENTRESTO) 97-103 MG Take 1 tablet by mouth 2 (two) times daily. 180 tablet 3   spironolactone (ALDACTONE) 25 MG tablet Take 1 tablet (25 mg total) by mouth daily. 90 tablet 3   No current facility-administered medications for this encounter.   Allergies  Allergen Reactions   Shellfish Allergy Anaphylaxis   Social History   Socioeconomic History   Marital status: Married    Spouse name: Not on file   Number of children: Not on file   Years of education: Not on file   Highest education level: Not on file  Occupational History   Not on file  Tobacco Use   Smoking status: Never   Smokeless tobacco: Never  Vaping Use   Vaping Use: Never used  Substance and Sexual Activity   Alcohol use: No   Drug use: No   Sexual activity: Yes    Birth control/protection: None  Other Topics Concern   Not on file  Social History Narrative   Not on file   Social Determinants of Health   Financial Resource Strain: Not on file  Food Insecurity: Not on file  Transportation Needs: Not on file  Physical  Activity: Not on file  Stress: Not on file  Social Connections: Not on file  Intimate Partner Violence: Not on file   Family History  Problem Relation Age of Onset   Cancer Mother    Heart disease Mother    Heart disease Father    Hypertension Father    Stroke Father    Cancer Maternal Aunt    Diabetes Maternal Grandmother    Wt Readings from Last 3 Encounters:  06/25/22 83.7 kg (184 lb 9.6 oz)  04/17/22 83.9 kg (185 lb)  03/26/22 83.5 kg (184 lb)   BP (!) 192/118   Pulse 92   Wt 83.7 kg (184 lb 9.6 oz)   SpO2 98%   BMI 38.58 kg/m   PHYSICAL EXAM: General:  NAD. No resp difficulty HEENT: Normal Neck: Supple. Thick neck. Carotids 2+ bilat; no bruits. No lymphadenopathy or thryomegaly appreciated. Cor: PMI nondisplaced. Regular rate & rhythm. No rubs, gallops or murmurs. Lungs: Clear Abdomen: Soft, obese, nontender, nondistended. No hepatosplenomegaly. No bruits or masses. Good bowel sounds. Extremities: No cyanosis, clubbing, rash, edema Neuro: Alert & oriented x 3, cranial nerves grossly intact. Moves all 4 extremities w/o difficulty. Affect pleasant.   ASSESSMENT & PLAN: 1. Chronic Systolic CHF: Echo in 2/23 with EF 40-45% range with mild LV dilation and mild LVH, moderate MR, moderate LAE, mildly decreased RV systolic function.  She has uncontrolled HTN as a major risk factor.  HIV and ANA negative and TSH normal.  No ETOH or drug history with negative UDS. Spontaneous miscarriages concerning for antiphospholipid antibody syndrome, but when this affects the heart, it generally leads to valvular problems and not cardiomyopathy => serologic workup negative for APLAS. No strong FH of CMP.  No significant coronary disease on cath.  Cardiac MRI in 2/23 showed normal LV size with mild LV hypertrophy, EF of 31% with diffuse hypokinesis, normal RV size with mild systolic dysfunction and EF of 38%, and no definite LGE noted.  With medical therapy, echo in 3/23 showed EF up to 55-60%.   She is not volume overloaded on exam, NYHA class I-II.  - Start BiDil 1 tablet tid. - Continue Entresto 97/103 mg bid. BMET today. - Continue spironolactone 25 mg daily.  - Continue Coreg 25 mg bid - Continue Farxiga 10 mg daily. - Discussed avoidance of pregnancy with current meds.  2.  HTN: Uncontrolled. Renal artery dopplers did not look like clinically significant RAS or fibromuscular dysplasia.  She has a BP cuff, I asked her to check daily and log. - Start BiDil as above. - Continue amlodipine 10 mg daily.  - Needs CPAP for treatment of OSA.  - Refer to HTN Clinic, Dr. Duke Salvia. 3.  H/o multiple miscarriages/spontaneous abortions: ?Antiphospholipid antibody syndrome => serologic workup for APLAS negative. 4. 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  - She has been seen by CT surgery and will be following with Dr. Cliffton Asters. She needs follow up as she  missed her last appointment. I asked her to call and arrange. 5. OSA: Needs CPAP titration.  Will send message to Dr. Norris Cross office to arrange this. 6. HLD: Last LDL 146 and atorvastatin increased to 80 mg daily. - Repeat lipids and LFTs next visit.  Follow up in 3 months with Dr. Shirlee Latch.  [facility] FNP-BC 06/25/2022

## 2022-06-26 ENCOUNTER — Telehealth (HOSPITAL_COMMUNITY): Payer: Self-pay | Admitting: Surgery

## 2022-06-26 DIAGNOSIS — I5022 Chronic systolic (congestive) heart failure: Secondary | ICD-10-CM

## 2022-06-26 MED ORDER — POTASSIUM CHLORIDE CRYS ER 20 MEQ PO TBCR: 20.0000 meq | EXTENDED_RELEASE_TABLET | Freq: Every day | ORAL | 6 refills | Status: DC

## 2022-06-26 NOTE — Telephone Encounter (Signed)
I attempted to reach patient to review results and recommendations per provider.  I left a message for a return call. 

## 2022-06-26 NOTE — Telephone Encounter (Signed)
-----   Message from Jessica M Milford, FNP sent at 06/26/2022  8:23 AM EDT ----- K is low.  Please take 40 KCL x 1 today, then tomorrow start 20 KCL daily.  Repeat BMET in 7-10 days please. 

## 2022-06-26 NOTE — Telephone Encounter (Signed)
Patient called back and I reviewed results and recommendations per [facility].  I sent prescription and updated medlist in [facility].  She will return Sept 7 for repeat labwork.

## 2022-06-26 NOTE — Telephone Encounter (Signed)
-----   Message from Jacklynn Ganong, [place] sent at 06/26/2022  8:23 AM EDT ----- K is low.  Please take 40 KCL x 1 today, then tomorrow start 20 KCL daily.  Repeat BMET in 7-10 days please.

## 2022-07-04 ENCOUNTER — Ambulatory Visit (HOSPITAL_COMMUNITY)
Admission: RE | Admit: 2022-07-04 | Discharge: 2022-07-04 | Disposition: A | Discharge status: Home / Self Care | Payer: BC Managed Care – PPO | Source: Ambulatory Visit | Attending: Internal Medicine | Admitting: Internal Medicine

## 2022-07-04 DIAGNOSIS — I5022 Chronic systolic (congestive) heart failure: Secondary | ICD-10-CM | POA: Insufficient documentation

## 2022-07-04 LAB — BASIC METABOLIC PANEL
Anion gap: 4 — ABNORMAL LOW (ref 5–15)
BUN: 13 mg/dL (ref 6–20)
CO2: 26 mmol/L (ref 22–32)
Calcium: 8.9 mg/dL (ref 8.9–10.3)
Chloride: 108 mmol/L (ref 98–111)
Creatinine, Ser: 0.79 mg/dL (ref 0.44–1.00)
GFR, Estimated: >60 mL/min (ref >60)
Glucose, Bld: 112 mg/dL — ABNORMAL HIGH (ref 70–99)
Potassium: 4.1 mmol/L (ref 3.5–5.1)
Sodium: 138 mmol/L (ref 135–145)

## 2022-07-05 ENCOUNTER — Ambulatory Visit: Payer: BC Managed Care – PPO | Admitting: Thoracic Surgery (Cardiothoracic Vascular Surgery)

## 2022-07-05 ENCOUNTER — Emergency Department (HOSPITAL_COMMUNITY): Payer: BC Managed Care – PPO

## 2022-07-05 ENCOUNTER — Emergency Department (HOSPITAL_COMMUNITY)
Admission: EM | Admit: 2022-07-05 | Discharge: 2022-07-05 | Disposition: A | Discharge status: Home / Self Care | Payer: BC Managed Care – PPO | Attending: Emergency Medicine | Admitting: Emergency Medicine

## 2022-07-05 VITALS — BP 200/110 | HR 82 | Resp 20 | Ht <= 58 in | Wt 185.0 lb

## 2022-07-05 DIAGNOSIS — I160 Hypertensive urgency: Secondary | ICD-10-CM | POA: Diagnosis not present

## 2022-07-05 DIAGNOSIS — I110 Hypertensive heart disease with heart failure: Secondary | ICD-10-CM | POA: Insufficient documentation

## 2022-07-05 DIAGNOSIS — I509 Heart failure, unspecified: Secondary | ICD-10-CM | POA: Insufficient documentation

## 2022-07-05 DIAGNOSIS — I10 Essential (primary) hypertension: Secondary | ICD-10-CM | POA: Diagnosis not present

## 2022-07-05 DIAGNOSIS — R519 Headache, unspecified: Secondary | ICD-10-CM | POA: Diagnosis not present

## 2022-07-05 LAB — COMPREHENSIVE METABOLIC PANEL
ALT: 13 U/L (ref 0–44)
AST: 15 U/L (ref 15–41)
Albumin: 4 g/dL (ref 3.5–5.0)
Alkaline Phosphatase: 57 U/L (ref 38–126)
Anion gap: 12 (ref 5–15)
BUN: 7 mg/dL (ref 6–20)
CO2: 24 mmol/L (ref 22–32)
Calcium: 9.5 mg/dL (ref 8.9–10.3)
Chloride: 103 mmol/L (ref 98–111)
Creatinine, Ser: 0.72 mg/dL (ref 0.44–1.00)
GFR, Estimated: >60 mL/min (ref >60)
Glucose, Bld: 102 mg/dL — ABNORMAL HIGH (ref 70–99)
Potassium: 4 mmol/L (ref 3.5–5.1)
Sodium: 139 mmol/L (ref 135–145)
Total Bilirubin: 0.3 mg/dL (ref 0.3–1.2)
Total Protein: 7.7 g/dL (ref 6.5–8.1)

## 2022-07-05 LAB — CBC WITH DIFFERENTIAL/PLATELET
Abs Immature Granulocytes: 0.01 10*3/uL (ref 0.00–0.07)
Basophils Absolute: 0 10*3/uL (ref 0.0–0.1)
Basophils Relative: 0 %
Eosinophils Absolute: 0.2 10*3/uL (ref 0.0–0.5)
Eosinophils Relative: 3 %
HCT: 40.6 % (ref 36.0–46.0)
Hemoglobin: 13.3 g/dL (ref 12.0–15.0)
Immature Granulocytes: 0 %
Lymphocytes Relative: 18 %
Lymphs Abs: 1.3 10*3/uL (ref 0.7–4.0)
MCH: 28.9 pg (ref 26.0–34.0)
MCHC: 32.8 g/dL (ref 30.0–36.0)
MCV: 88.1 fL (ref 80.0–100.0)
Monocytes Absolute: 0.4 10*3/uL (ref 0.1–1.0)
Monocytes Relative: 6 %
Neutro Abs: 5.2 10*3/uL (ref 1.7–7.7)
Neutrophils Relative %: 73 %
Platelets: 285 10*3/uL (ref 150–400)
RBC: 4.61 MIL/uL (ref 3.87–5.11)
RDW: 13.7 % (ref 11.5–15.5)
WBC: 7.2 10*3/uL (ref 4.0–10.5)
nRBC: 0 % (ref 0.0–0.2)

## 2022-07-05 LAB — I-STAT BETA HCG BLOOD, ED (MC, WL, AP ONLY): I-stat hCG, quantitative: <5 m[IU]/mL (ref <5)

## 2022-07-05 MED ORDER — ONDANSETRON 4 MG PO TBDP
8.0000 mg | ORAL_TABLET | Freq: Once | ORAL | Status: AC
  Administered 2022-07-05: 8 mg via ORAL
  Filled 2022-07-05: qty 2

## 2022-07-05 MED ORDER — PROCHLORPERAZINE EDISYLATE 10 MG/2ML IJ SOLN
5.0000 mg | Freq: Once | INTRAMUSCULAR | Status: DC
  Filled 2022-07-05: qty 2

## 2022-07-05 MED ORDER — KETOROLAC TROMETHAMINE 60 MG/2ML IM SOLN
30.0000 mg | Freq: Once | INTRAMUSCULAR | Status: AC
  Administered 2022-07-05: 30 mg via INTRAMUSCULAR
  Filled 2022-07-05: qty 2

## 2022-07-05 MED ORDER — ACETAMINOPHEN 500 MG PO TABS: 1000.0000 mg | ORAL_TABLET | Freq: Four times a day (QID) | ORAL | Status: DC | PRN

## 2022-07-05 MED ORDER — KETOROLAC TROMETHAMINE 15 MG/ML IJ SOLN
15.0000 mg | Freq: Once | INTRAMUSCULAR | Status: DC
  Filled 2022-07-05: qty 1

## 2022-07-05 MED ORDER — ISOSORB DINITRATE-HYDRALAZINE 20-37.5 MG PO TABS
1.0000 | ORAL_TABLET | Freq: Once | ORAL | Status: AC
  Administered 2022-07-05: 1 via ORAL
  Filled 2022-07-05: qty 1

## 2022-07-05 NOTE — ED Provider Notes (Signed)
[facility] EMERGENCY DEPARTMENT Provider Note   CSN: [id number] Arrival date & time: 07/05/22  1142     History  Chief Complaint  Patient presents with   Headache    Alice Hays is a 41 y.o. female. Past medical history of hypertension and heart failure presenting with headache and high blood pressure. She was sent here from cardiology clinic due to the symptoms. Reports compliance with her blood pressure medications, however she did miss her midday medication due to being in the emergency department today.  She did take her morning medications.  States she was started on a new blood pressure medication 1 week ago.  Denies history of migraines.  She has had similar headaches, however have not been quite this severe.  She denies any vision changes.  She reports initial nausea and one episode of nonbloody emesis, which she had Zofran in triage and denies current nausea.  Denies fever.  Denies numbness or tingling in her extremities.  Headache Associated symptoms: nausea   Associated symptoms: no abdominal pain, no back pain, no cough, no ear pain, no eye pain, no fever, no seizures, no sore throat and no vomiting        Home Medications Prior to Admission medications   Medication Sig Start Date End Date Taking? Authorizing Provider  amLODipine (NORVASC) 10 MG tablet Take 1 tablet (10 mg total) by mouth daily. 04/17/22   Laurey Morale, MD  atorvastatin (LIPITOR) 80 MG tablet Take 1 tablet (80 mg total) by mouth daily. 04/03/22   Laurey Morale, MD  carvedilol (COREG) 25 MG tablet Take 1 tablet (25 mg total) by mouth 2 (two) times daily with a meal. 04/17/22   Laurey Morale, MD  dapagliflozin propanediol (FARXIGA) 10 MG TABS tablet Take 1 tablet (10 mg total) by mouth daily before breakfast. 12/07/21   Milford, Anderson Malta, FNP  ferrous sulfate 325 (65 FE) MG tablet Take 1 tablet (325 mg total) by mouth every other day. 12/04/21   Levin Erp, MD   isosorbide-hydrALAZINE (BIDIL) 20-37.5 MG tablet Take 1 tablet by mouth 3 (three) times daily. 06/25/22   Jacklynn Ganong, FNP  Multiple Vitamin (MV-ONE PO) Take 1 capsule by mouth every morning.    [provider]  potassium chloride SA (KLOR-CON M) 20 MEQ tablet Take 1 tablet (20 mEq total) by mouth daily. Take 40 meq x 1 dose today and 20 meq daily thereafter. 06/26/22   Milford, Anderson Malta, FNP  sacubitril-valsartan (ENTRESTO) 97-103 MG Take 1 tablet by mouth 2 (two) times daily. 12/07/21   Jacklynn Ganong, FNP  spironolactone (ALDACTONE) 25 MG tablet Take 1 tablet (25 mg total) by mouth daily. 12/07/21   Jacklynn Ganong, FNP      Allergies    Shellfish allergy    Review of Systems   Review of Systems  Constitutional:  Negative for chills and fever.  HENT:  Negative for ear pain and sore throat.   Eyes:  Negative for pain and visual disturbance.  Respiratory:  Negative for cough and shortness of breath.   Cardiovascular:  Negative for chest pain and palpitations.  Gastrointestinal:  Positive for nausea. Negative for abdominal pain and vomiting.  Genitourinary:  Negative for dysuria and hematuria.  Musculoskeletal:  Negative for arthralgias and back pain.  Skin:  Negative for color change and rash.  Neurological:  Positive for headaches. Negative for seizures and syncope.  All other systems reviewed and are negative.  Physical Exam Updated Vital Signs BP (!) 154/99   Pulse 77   Temp 98.1 F (36.7 C)   Resp 14   SpO2 100%  Physical Exam Vitals and nursing note reviewed.  Constitutional:      General: She is not in acute distress.    Appearance: She is well-developed.  HENT:     Head: Normocephalic and atraumatic.  Eyes:     Conjunctiva/sclera: Conjunctivae normal.  Cardiovascular:     Rate and Rhythm: Normal rate and regular rhythm.     Heart sounds: No murmur heard. Pulmonary:     Effort: Pulmonary effort is normal. No respiratory distress.     Breath  sounds: Normal breath sounds.  Abdominal:     Palpations: Abdomen is soft.     Tenderness: There is no abdominal tenderness.  Musculoskeletal:        General: No swelling.     Cervical back: Neck supple.  Skin:    General: Skin is warm and dry.     Capillary Refill: Capillary refill takes less than 2 seconds.  Neurological:     Mental Status: She is alert.     GCS: GCS eye subscore is 4. GCS verbal subscore is 5. GCS motor subscore is 6.     Cranial Nerves: Cranial nerves 2-12 are intact. No cranial nerve deficit.     Sensory: Sensation is intact. No sensory deficit.     Motor: Motor function is intact.     Coordination: Coordination is intact.  Psychiatric:        Mood and Affect: Mood normal.     ED Results / Procedures / Treatments   Labs (all labs ordered are listed, but only abnormal results are displayed) Labs Reviewed  COMPREHENSIVE METABOLIC PANEL - Abnormal; Notable for the following components:      Result Value   Glucose, Bld 102 (*)    All other components within normal limits  CBC WITH DIFFERENTIAL/PLATELET  I-STAT BETA HCG BLOOD, ED ([facility], [facility], [facility] ONLY)    EKG EKG Interpretation  Date/Time:  Friday July 05 2022 18:42:04 EDT Ventricular Rate:  80 PR Interval:  202 QRS Duration: 82 QT Interval:  370 QTC Calculation: 426 R Axis:   -31 Text Interpretation: Normal sinus rhythm Possible Left atrial enlargement Left axis deviation Abnormal ECG When compared with ECG of 26-Mar-2022 15:11, PREVIOUS ECG IS PRESENT Confirmed by Edwin Dada ([id number]) on 07/05/2022 6:44:42 PM  Radiology CT Head Wo Contrast  Result Date: 07/05/2022 CLINICAL DATA:  Sudden onset of severe headache.  Hypertension. EXAM: CT HEAD WITHOUT CONTRAST TECHNIQUE: Contiguous axial images were obtained from the base of the skull through the vertex without intravenous contrast. RADIATION DOSE REDUCTION: This exam was performed according to the departmental dose-optimization program which includes  automated exposure control, adjustment of the mA and/or kV according to patient size and/or use of iterative reconstruction technique. COMPARISON:  Limited correlation made with PET-CT 03/20/2022. FINDINGS: Brain: There is no evidence of acute intracranial hemorrhage, mass lesion, brain edema or extra-axial fluid collection. The ventricles and subarachnoid spaces are appropriately sized for age. There is no CT evidence of acute cortical infarction. Vascular:  No hyperdense vessel identified. Skull: Negative for fracture or focal lesion. Sinuses/Orbits: The visualized paranasal sinuses and mastoid air cells are clear. No orbital abnormalities are seen. Other: None. IMPRESSION: Unremarkable noncontrast head CT. Electronically Signed   By: Carey Bullocks M.D.   On: 07/05/2022 14:03    Procedures Procedures    Medications  Ordered in ED Medications  acetaminophen (TYLENOL) tablet 1,000 mg (has no administration in time range)  ondansetron (ZOFRAN-ODT) disintegrating tablet 8 mg (8 mg Oral Given 07/05/22 1337)  isosorbide-hydrALAZINE (BIDIL) 20-37.5 MG per tablet 1 tablet (1 tablet Oral Given 07/05/22 1831)  ketorolac (TORADOL) injection 30 mg (30 mg Intramuscular Given 07/05/22 1831)    ED Course/ Medical Decision Making/ A&P Clinical Course as of 07/05/22 2324  Fri Jul 05, 2022  1920 41 yo female with poorly controlled HTN on amlodipine, coreg, lisinopril combo, spirinolactone, and hydralazine combo presenting for headache. BP on arrival 175/111. Missed afternoon med. Med given. No neurovascular deficits. Otherwise stable workup/labs. No end organ failure/damage. Symptoms improved in ED. BP improving. Safe for DC home and f/u with cards for further BP management. [AG]    Clinical Course User Index [AG] Franne Forts, DO                           Medical Decision Making Risk OTC drugs. Prescription drug management.   41 year old female with past medical history of hypertension, multiple  miscarriages, mediastinal mass, and heart failure with EF of 45%.  Presenting due to headache and uncontrolled high blood pressure.  She was initially seen in cardiology clinic and then sent to the ED for further work-up.  Differential diagnosis includes hypertensive urgency, hypertensive emergency, intracranial hemorrhage, migraine, tension headache, AKI OSA, pregnancy.  Labs reviewed and reassuring. No AKI, electrolyte abnormalities, leukocytosis, anemia.  Pregnancy negative. CT head reassuring, no signs of intracranial hemorrhage or large infarction.   EKG reassuring.  Sinus rhythm, rate 80.  No ST elevations or depressions. Appears similar to prior.   Initial blood pressure in clinic today with SBP 200.  However on arrival to the ED blood pressure has been 175/111.  She did miss her midday medication. Bidil home med ordered.  Patient given tylenol and toradol for symptomatic treatment.   On reevaluation, patient has had improvement of her symptoms.  Overall concern for hypertensive urgency in the setting of her elevated blood pressure and headache.  Her blood pressure has improved throughout her time in the ED.  It is near her baseline.  Recommended close follow-up with her cardiologist and continuation of all of her home medications.  Strict return precautions given.        Final Clinical Impression(s) / ED Diagnoses Final diagnoses:  Hypertensive urgency    Rx / DC Orders ED Discharge Orders     None         Kela Millin, MD 07/05/22 2325    Edwin Dada P, DO 07/16/22 1055

## 2022-07-05 NOTE — ED Provider Triage Note (Signed)
Emergency Medicine Provider Triage Evaluation Note  Alice Hays , a 41 y.o. female  was evaluated in triage.  Pt complains of headache.  Patient states that headache began earlier today.  Began gradually has gotten worse since onset.  She states she was seen by her cardiothoracic surgeon earlier today with a reported blood pressure of 200/110 and was sent to the emergency department for further evaluation.  Patient notes history of headaches but not 1 as severe as this.  She has tried no medication for it.  She states she has taken her at home blood pressure medication today.  Denies fever, neck stiffness/pain, visual disturbance, slurred speech, facial droop, weakness/sensory deficits, difficulty ambulating.  Denies history of DVT/PE, recent surgery, immobilization.  Review of Systems  Positive: See above Negative:   Physical Exam  BP (!) 175/111 (BP Location: Left Arm)   Pulse 85   Temp 98.4 F (36.9 C) (Oral)   Resp 16   SpO2 100%  Gen:   Awake, no distress   Resp:  Normal effort  MSK:   Moves extremities without difficulty  Other:  Cranial nerves III through XII grossly intact.  Medical Decision Making  Medically screening exam initiated at 1:17 PM.  Appropriate orders placed.  Alice Hays was informed that the remainder of the evaluation will be completed by another provider, this initial triage assessment does not replace that evaluation, and the importance of remaining in the ED until their evaluation is complete.     Peter Garter, [place] 07/05/22 1319

## 2022-07-05 NOTE — ED Triage Notes (Signed)
Pt arrives via [facility] from cardiologist's office for hypertension and migraine. Pt reported recent change in BP medication change.   EMS last VS - 174/110, HR 96, RR 18, 97% on RA.

## 2022-07-05 NOTE — ED Notes (Signed)
Patient verbalizes understanding of d/c instructions. Opportunities for questions and answers were provided. Pt d/c from ED and ambulated to lobby.  

## 2022-07-05 NOTE — Discharge Instructions (Signed)
Follow up with your established cardiologist for futher managment of blood pressure.

## 2022-07-24 ENCOUNTER — Encounter: Payer: Self-pay | Admitting: Cardiovascular Disease

## 2022-07-31 ENCOUNTER — Ambulatory Visit: Payer: Self-pay | Admitting: Nurse Practitioner

## 2022-09-27 ENCOUNTER — Encounter (HOSPITAL_COMMUNITY): Payer: BC Managed Care – PPO | Admitting: Cardiology

## 2023-01-08 ENCOUNTER — Emergency Department (HOSPITAL_COMMUNITY)
Admission: EM | Admit: 2023-01-08 | Discharge: 2023-01-09 | Disposition: A | Discharge status: Home / Self Care | Payer: BC Managed Care – PPO | Attending: Emergency Medicine | Admitting: Emergency Medicine

## 2023-01-08 ENCOUNTER — Emergency Department (HOSPITAL_COMMUNITY): Payer: BC Managed Care – PPO

## 2023-01-08 ENCOUNTER — Other Ambulatory Visit: Payer: Self-pay

## 2023-01-08 ENCOUNTER — Encounter (HOSPITAL_COMMUNITY): Payer: Self-pay | Admitting: Emergency Medicine

## 2023-01-08 DIAGNOSIS — I10 Essential (primary) hypertension: Secondary | ICD-10-CM | POA: Diagnosis not present

## 2023-01-08 DIAGNOSIS — J9811 Atelectasis: Secondary | ICD-10-CM | POA: Diagnosis not present

## 2023-01-08 DIAGNOSIS — J209 Acute bronchitis, unspecified: Secondary | ICD-10-CM | POA: Insufficient documentation

## 2023-01-08 DIAGNOSIS — R000 Tachycardia, unspecified: Secondary | ICD-10-CM | POA: Diagnosis not present

## 2023-01-08 DIAGNOSIS — I5020 Unspecified systolic (congestive) heart failure: Secondary | ICD-10-CM | POA: Insufficient documentation

## 2023-01-08 DIAGNOSIS — I110 Hypertensive heart disease with heart failure: Secondary | ICD-10-CM | POA: Insufficient documentation

## 2023-01-08 DIAGNOSIS — Z1152 Encounter for screening for COVID-19: Secondary | ICD-10-CM | POA: Insufficient documentation

## 2023-01-08 DIAGNOSIS — E876 Hypokalemia: Secondary | ICD-10-CM | POA: Insufficient documentation

## 2023-01-08 DIAGNOSIS — Z79899 Other long term (current) drug therapy: Secondary | ICD-10-CM | POA: Insufficient documentation

## 2023-01-08 DIAGNOSIS — R0602 Shortness of breath: Secondary | ICD-10-CM | POA: Diagnosis not present

## 2023-01-08 LAB — BASIC METABOLIC PANEL
Anion gap: 10 (ref 5–15)
BUN: 14 mg/dL (ref 6–20)
CO2: 24 mmol/L (ref 22–32)
Calcium: 8.9 mg/dL (ref 8.9–10.3)
Chloride: 102 mmol/L (ref 98–111)
Creatinine, Ser: 0.91 mg/dL (ref 0.44–1.00)
GFR, Estimated: >60 mL/min (ref >60)
Glucose, Bld: 119 mg/dL — ABNORMAL HIGH (ref 70–99)
Potassium: 3.4 mmol/L — ABNORMAL LOW (ref 3.5–5.1)
Sodium: 136 mmol/L (ref 135–145)

## 2023-01-08 LAB — CBC WITH DIFFERENTIAL/PLATELET
Abs Immature Granulocytes: 0.02 10*3/uL (ref 0.00–0.07)
Basophils Absolute: 0.1 10*3/uL (ref 0.0–0.1)
Basophils Relative: 1 %
Eosinophils Absolute: 0.7 10*3/uL — ABNORMAL HIGH (ref 0.0–0.5)
Eosinophils Relative: 7 %
HCT: 39.8 % (ref 36.0–46.0)
Hemoglobin: 12.4 g/dL (ref 12.0–15.0)
Immature Granulocytes: 0 %
Lymphocytes Relative: 28 %
Lymphs Abs: 2.5 10*3/uL (ref 0.7–4.0)
MCH: 27 pg (ref 26.0–34.0)
MCHC: 31.2 g/dL (ref 30.0–36.0)
MCV: 86.5 fL (ref 80.0–100.0)
Monocytes Absolute: 0.6 10*3/uL (ref 0.1–1.0)
Monocytes Relative: 7 %
Neutro Abs: 5.1 10*3/uL (ref 1.7–7.7)
Neutrophils Relative %: 57 %
Platelets: 274 10*3/uL (ref 150–400)
RBC: 4.6 MIL/uL (ref 3.87–5.11)
RDW: 15.3 % (ref 11.5–15.5)
WBC: 9 10*3/uL (ref 4.0–10.5)
nRBC: 0 % (ref 0.0–0.2)

## 2023-01-08 NOTE — ED Provider Triage Note (Signed)
Emergency Medicine Provider Triage Evaluation Note  Alice Hays , a 42 y.o. female  was evaluated in triage.  Pt complains of difficulty breathing which began this weekend.  Patient reports she felt like she was having allergic reaction to an unknown source.  She does not have any prior history of allergies.  She does have a prior history of heart failure, reports she thought she was likely doing good however obvious wheezing was auscultated on my exam.  Has taken Xyzal for her symptoms without any improvement.  She also endorses feeling like her throat is closing in and that is have a change of voice over the last couple of days.  Review of Systems  Positive: Sob, wheezing Negative: Fever, leg swelling, cp  Physical Exam  There were no vitals taken for this visit. Gen:   Awake, no distress   Resp:  Normal effort  MSK:   Moves extremities without difficulty  Other:  Wheezing to the left upper lung field  Medical Decision Making  Medically screening exam initiated at 7:23 PM.  Appropriate orders placed.  Renatha L Marinaccio was informed that the remainder of the evaluation will be completed by another provider, this initial triage assessment does not replace that evaluation, and the importance of remaining in the ED until their evaluation is complete.     Janeece Fitting, PA-C 01/08/23 1955

## 2023-01-08 NOTE — ED Triage Notes (Signed)
Pt c/o shortness of breath since this weekend. Denies fevers, chills, or chest pain. Pt states she took otc allergy medication w/o relief. Pt also c/o voice hoarseness that started this weekend.

## 2023-01-09 ENCOUNTER — Other Ambulatory Visit (HOSPITAL_COMMUNITY): Payer: Self-pay | Admitting: Family Medicine

## 2023-01-09 LAB — RESP PANEL BY RT-PCR (RSV, FLU A&B, COVID)  RVPGX2
Influenza A by PCR: NEGATIVE
Influenza B by PCR: NEGATIVE
Resp Syncytial Virus by PCR: NEGATIVE
SARS Coronavirus 2 by RT PCR: NEGATIVE

## 2023-01-09 LAB — BRAIN NATRIURETIC PEPTIDE: B Natriuretic Peptide: 415.4 pg/mL — ABNORMAL HIGH (ref 0.0–100.0)

## 2023-01-09 MED ORDER — SACUBITRIL-VALSARTAN 97-103 MG PO TABS
1.0000 | ORAL_TABLET | Freq: Two times a day (BID) | ORAL | Status: DC
  Administered 2023-01-09: 1 via ORAL
  Filled 2023-01-09: qty 1

## 2023-01-09 MED ORDER — IPRATROPIUM-ALBUTEROL 0.5-2.5 (3) MG/3ML IN SOLN
3.0000 mL | Freq: Once | RESPIRATORY_TRACT | Status: AC
  Administered 2023-01-09: 3 mL via RESPIRATORY_TRACT
  Filled 2023-01-09: qty 3

## 2023-01-09 MED ORDER — POTASSIUM CHLORIDE CRYS ER 20 MEQ PO TBCR
40.0000 meq | EXTENDED_RELEASE_TABLET | Freq: Once | ORAL | Status: AC
  Administered 2023-01-09: 40 meq via ORAL
  Filled 2023-01-09: qty 2

## 2023-01-09 MED ORDER — ALBUTEROL SULFATE HFA 108 (90 BASE) MCG/ACT IN AERS: 1.0000 | INHALATION_SPRAY | Freq: Four times a day (QID) | RESPIRATORY_TRACT | 0 refills | Status: DC | PRN

## 2023-01-09 MED ORDER — CARVEDILOL 12.5 MG PO TABS: 25.0000 mg | ORAL_TABLET | Freq: Two times a day (BID) | ORAL | Status: DC

## 2023-01-09 MED ORDER — LABETALOL HCL 5 MG/ML IV SOLN
10.0000 mg | Freq: Once | INTRAVENOUS | Status: DC
  Filled 2023-01-09: qty 4

## 2023-01-09 MED ORDER — DOXYCYCLINE HYCLATE 100 MG PO CAPS: 100.0000 mg | ORAL_CAPSULE | Freq: Two times a day (BID) | ORAL | 0 refills | Status: DC

## 2023-01-09 MED ORDER — LABETALOL HCL 200 MG PO TABS
100.0000 mg | ORAL_TABLET | Freq: Once | ORAL | Status: AC
  Administered 2023-01-09: 100 mg via ORAL
  Filled 2023-01-09: qty 1

## 2023-01-09 NOTE — ED Provider Notes (Signed)
[facility] Provider Note   CSN: [id number] Arrival date & time: 01/08/23  1901     History  Chief Complaint  Patient presents with   Shortness of Breath    Alice Hays is a 42 y.o. female.  42 year old female with history of CHF (ef 55-60% 01/17/22 echo), HTN, presents with complaint of shob with wheezing, cough (productive with yellow mucous), congestion onset Saturday, exposed to her daughter who also had a cold recently. Patient thought initially had allergies but not exposed to anything she is allergic to (shellfish). Has missed PM BP meds, denies CP, no lower ext edema.        Home Medications Prior to Admission medications   Medication Sig Start Date End Date Taking? Authorizing Provider  albuterol (VENTOLIN HFA) 108 (90 Base) MCG/ACT inhaler Inhale 1-2 puffs into the lungs every 6 (six) hours as needed for wheezing or shortness of breath. 01/09/23  Yes Tacy Learn, PA-C  doxycycline (VIBRAMYCIN) 100 MG capsule Take 1 capsule (100 mg total) by mouth 2 (two) times daily. 01/09/23  Yes Tacy Learn, PA-C  amLODipine (NORVASC) 10 MG tablet Take 1 tablet (10 mg total) by mouth daily. 04/17/22   Larey Dresser, MD  atorvastatin (LIPITOR) 80 MG tablet Take 1 tablet (80 mg total) by mouth daily. 04/03/22   Larey Dresser, MD  carvedilol (COREG) 25 MG tablet Take 1 tablet (25 mg total) by mouth 2 (two) times daily with a meal. 04/17/22   Larey Dresser, MD  dapagliflozin propanediol (FARXIGA) 10 MG TABS tablet Take 1 tablet (10 mg total) by mouth daily before breakfast. 12/07/21   Milford, Maricela Bo, FNP  ferrous sulfate 325 (65 FE) MG tablet Take 1 tablet (325 mg total) by mouth every other day. 12/04/21   Gerrit Heck, MD  isosorbide-hydrALAZINE (BIDIL) 20-37.5 MG tablet Take 1 tablet by mouth 3 (three) times daily. 06/25/22   Rafael Bihari, FNP  Multiple Vitamin (MV-ONE PO) Take 1 capsule by mouth every morning.     [provider]  potassium chloride SA (KLOR-CON M) 20 MEQ tablet Take 1 tablet (20 mEq total) by mouth daily. Take 40 meq x 1 dose today and 20 meq daily thereafter. 06/26/22   Milford, Maricela Bo, FNP  sacubitril-valsartan (ENTRESTO) 97-103 MG Take 1 tablet by mouth 2 (two) times daily. 12/07/21   Rafael Bihari, FNP  spironolactone (ALDACTONE) 25 MG tablet Take 1 tablet (25 mg total) by mouth daily. 12/07/21   Rafael Bihari, FNP      Allergies    Shellfish allergy    Review of Systems   Review of Systems Negative except as per HPI Physical Exam Updated Vital Signs BP (!) 161/108 (BP Location: Right Arm)   Pulse 99   Temp 98.8 F (37.1 C) (Oral)   Resp 20   Ht '4\' 10"'$  (1.473 m)   Wt 81.2 kg   SpO2 99%   BMI 37.41 kg/m  Physical Exam Vitals and nursing note reviewed.  Constitutional:      General: She is not in acute distress.    Appearance: She is well-developed. She is not diaphoretic.  HENT:     Head: Normocephalic and atraumatic.     Nose: Congestion present.  Cardiovascular:     Rate and Rhythm: Regular rhythm. Tachycardia present.  Pulmonary:     Effort: Tachypnea present.     Breath sounds: Examination of the right-upper field  reveals wheezing. Examination of the left-upper field reveals wheezing. Wheezing present.  Chest:     Chest wall: No tenderness.  Abdominal:     Palpations: Abdomen is soft.     Tenderness: There is no abdominal tenderness.  Musculoskeletal:     Right lower leg: No tenderness. No edema.     Left lower leg: No tenderness. No edema.  Skin:    General: Skin is warm and dry.  Neurological:     Mental Status: She is alert and oriented to person, place, and time.  Psychiatric:        Behavior: Behavior normal.     ED Results / Procedures / Treatments   Labs (all labs ordered are listed, but only abnormal results are displayed) Labs Reviewed  BASIC METABOLIC PANEL - Abnormal; Notable for the following components:       Result Value   Potassium 3.4 (*)    Glucose, Bld 119 (*)    All other components within normal limits  CBC WITH DIFFERENTIAL/PLATELET - Abnormal; Notable for the following components:   Eosinophils Absolute 0.7 (*)    All other components within normal limits  BRAIN NATRIURETIC PEPTIDE - Abnormal; Notable for the following components:   B Natriuretic Peptide 415.4 (*)    All other components within normal limits  RESP PANEL BY RT-PCR (RSV, FLU A&B, COVID)  [id number]    EKG EKG Interpretation  Date/Time:  Thursday January 09 2023 03:42:18 EDT Ventricular Rate:  101 PR Interval:  160 QRS Duration: 84 QT Interval:  382 QTC Calculation: 496 R Axis:   -11 Text Interpretation: Sinus tachycardia Biatrial enlargement Borderline prolonged QT interval No significant change was found Confirmed by Addison Lank [phone]) on 01/09/2023 5:43:45 AM  Radiology DG Chest 2 View  Result Date: 01/08/2023 CLINICAL DATA:  Shortness of breath. EXAM: CHEST - 2 VIEW COMPARISON:  11/28/2021 FINDINGS: Cardiopericardial silhouette is at upper limits of normal for size. No pulmonary edema or focal airspace consolidation. Basilar streaky opacities suggest atelectasis. No pleural effusion. The visualized bony structures of the thorax are unremarkable. IMPRESSION: Basilar atelectasis without acute cardiopulmonary findings. Electronically Signed   By: Misty Stanley M.D.   On: 01/08/2023 20:21    Procedures Procedures    Medications Ordered in ED Medications  labetalol (NORMODYNE) injection 10 mg (has no administration in time range)  sacubitril-valsartan (ENTRESTO) 97-103 mg per tablet (has no administration in time range)  ipratropium-albuterol (DUONEB) 0.5-2.5 (3) MG/3ML nebulizer solution 3 mL (3 mLs Nebulization Given 01/09/23 0455)  potassium chloride SA (KLOR-CON M) CR tablet 40 mEq (40 mEq Oral Given 01/09/23 0454)    ED Course/ Medical Decision Making/ A&P                             Medical Decision  Making Risk Prescription drug management.   This patient presents to the ED for concern of SHOB, cough, sore throat, this involves an extensive number of treatment options, and is a complaint that carries with it a high risk of complications and morbidity.  The differential diagnosis includes but not limited to viral illness such as RSV/flu/COVID, pneumonia, CHF exacerbation, hypertensive emergency   Co morbidities that complicate the patient evaluation  CHF, prediabetes, hypertension   Additional history obtained:  External records from outside source obtained and reviewed including prior labs on file.  Last echo dated 01/17/2022 with EF 55 to 60%.   Lab Tests:  I Ordered, and  personally interpreted labs.  The pertinent results include: CBC unremarkable.  BMP with mild hypokalemia with potassium 3.4.  BNP improved from prior at 415.  RVP negative for COVID/flu/RSV.   Imaging Studies ordered:  I ordered imaging studies including chest x-ray I independently visualized and interpreted imaging which showed atelectasis I agree with the radiologist interpretation   Cardiac Monitoring: / EKG:  The patient was maintained on a cardiac monitor.  I personally viewed and interpreted the cardiac monitored which showed an underlying rhythm of: Sinus tachycardia, rate 101   Consultations Obtained:  I requested consultation with the ER attending, Dr. Leonette Monarch,  and discussed lab and imaging findings as well as pertinent plan - they recommend: Home Entresto, labetalol   Problem List / ED Course / Critical interventions / Medication management  42 year old female with history as above with suspected viral respiratory illness complicated by underlying health problems.  Chest x-ray does not show suggest volume overload, negative for pneumonia.  Labs overall reassuring.  Symptoms significantly improved with DuoNeb.  Blood pressure somewhat improved with appropriately sized cuff.  Plan is for home  blood pressure meds, reassess.  If blood pressure improving and kidney patient continuing to improve, may discharge home with prescriptions provided. I ordered medication including DuoNeb, K dur for wheezing, SHOB  Reevaluation of the patient after these medicines showed that the patient improved I have reviewed the patients home medicines and have made adjustments as needed   Social Determinants of Health:  Has PCP   Test / Admission - Considered:  Admit if not improving, blood pressure trend improving, suspect will be ready for discharge after blood pressure medications.         Final Clinical Impression(s) / ED Diagnoses Final diagnoses:  Acute bronchitis, unspecified organism    Rx / DC Orders ED Discharge Orders          Ordered    doxycycline (VIBRAMYCIN) 100 MG capsule  2 times daily        01/09/23 0629    albuterol (VENTOLIN HFA) 108 (90 Base) MCG/ACT inhaler  Every 6 hours PRN        01/09/23 0629              Tacy Learn, PA-C 01/09/23 [id number]    Fatima Blank, MD 01/09/23 0800

## 2023-01-09 NOTE — ED Provider Notes (Signed)
Accepted handoff at shift change from Suella Broad PA-C. Please see prior provider note for more detail.   Briefly: Patient is 42 y.o. with history of CHF and hypertension presenting for shortness of breath wheezing cough and congestion that started on Saturday.  Noted to be hypertensive during this encounter.  DDX: concern for RSV/flu/COVID, pneumonia, CHF exacerbation, hypertensive emergency   Plan: Treating hypertension with oral labetalol.  Also treating with doxycycline for concern of acute bronchitis.  Will reassess blood pressure and symptoms after treatment with DuoNeb.    Physical Exam  BP 102/61 (BP Location: Right Arm)   Pulse 63   Temp 97.8 F (36.6 C) (Oral)   Resp 18   Ht 4\' 10"  (1.473 m)   Wt 81.2 kg   SpO2 100%   BMI 37.41 kg/m   Physical Exam  Procedures  Procedures  ED Course / MDM    Medical Decision Making Risk Prescription drug management.   Patient states she is feeling much better overall and blood pressure improved after treatment with oral labetalol.. Advised to continue taking doxycycline at home. Also recommend that she continue taking her blood pressure medications as prescribed to her by her PCP. Also recommend to follow-up with her PCP.    Harriet Pho, PA-C 01/09/23 0935    Gareth Morgan, MD 01/12/23 [phone]

## 2023-01-09 NOTE — Discharge Instructions (Signed)
Take doxycycline as prescribed and complete the full course.  Use inhaler as needed as prescribed.  Take your blood pressure medications as prescribed, monitor your blood pressure and follow-up with your doctor.  Return to ER for worsening or concerning symptoms.

## 2023-02-05 ENCOUNTER — Other Ambulatory Visit (HOSPITAL_COMMUNITY): Payer: Self-pay | Admitting: Family Medicine

## 2023-02-12 IMAGING — DX DG CHEST 2V
2 series · 2 of 2 positions shown · non-contrast
Comparison: None.

CLINICAL DATA: Shortness of breath.

EXAM:
CHEST - 2 VIEW.  Slight patient rotation.

[w chest pa]
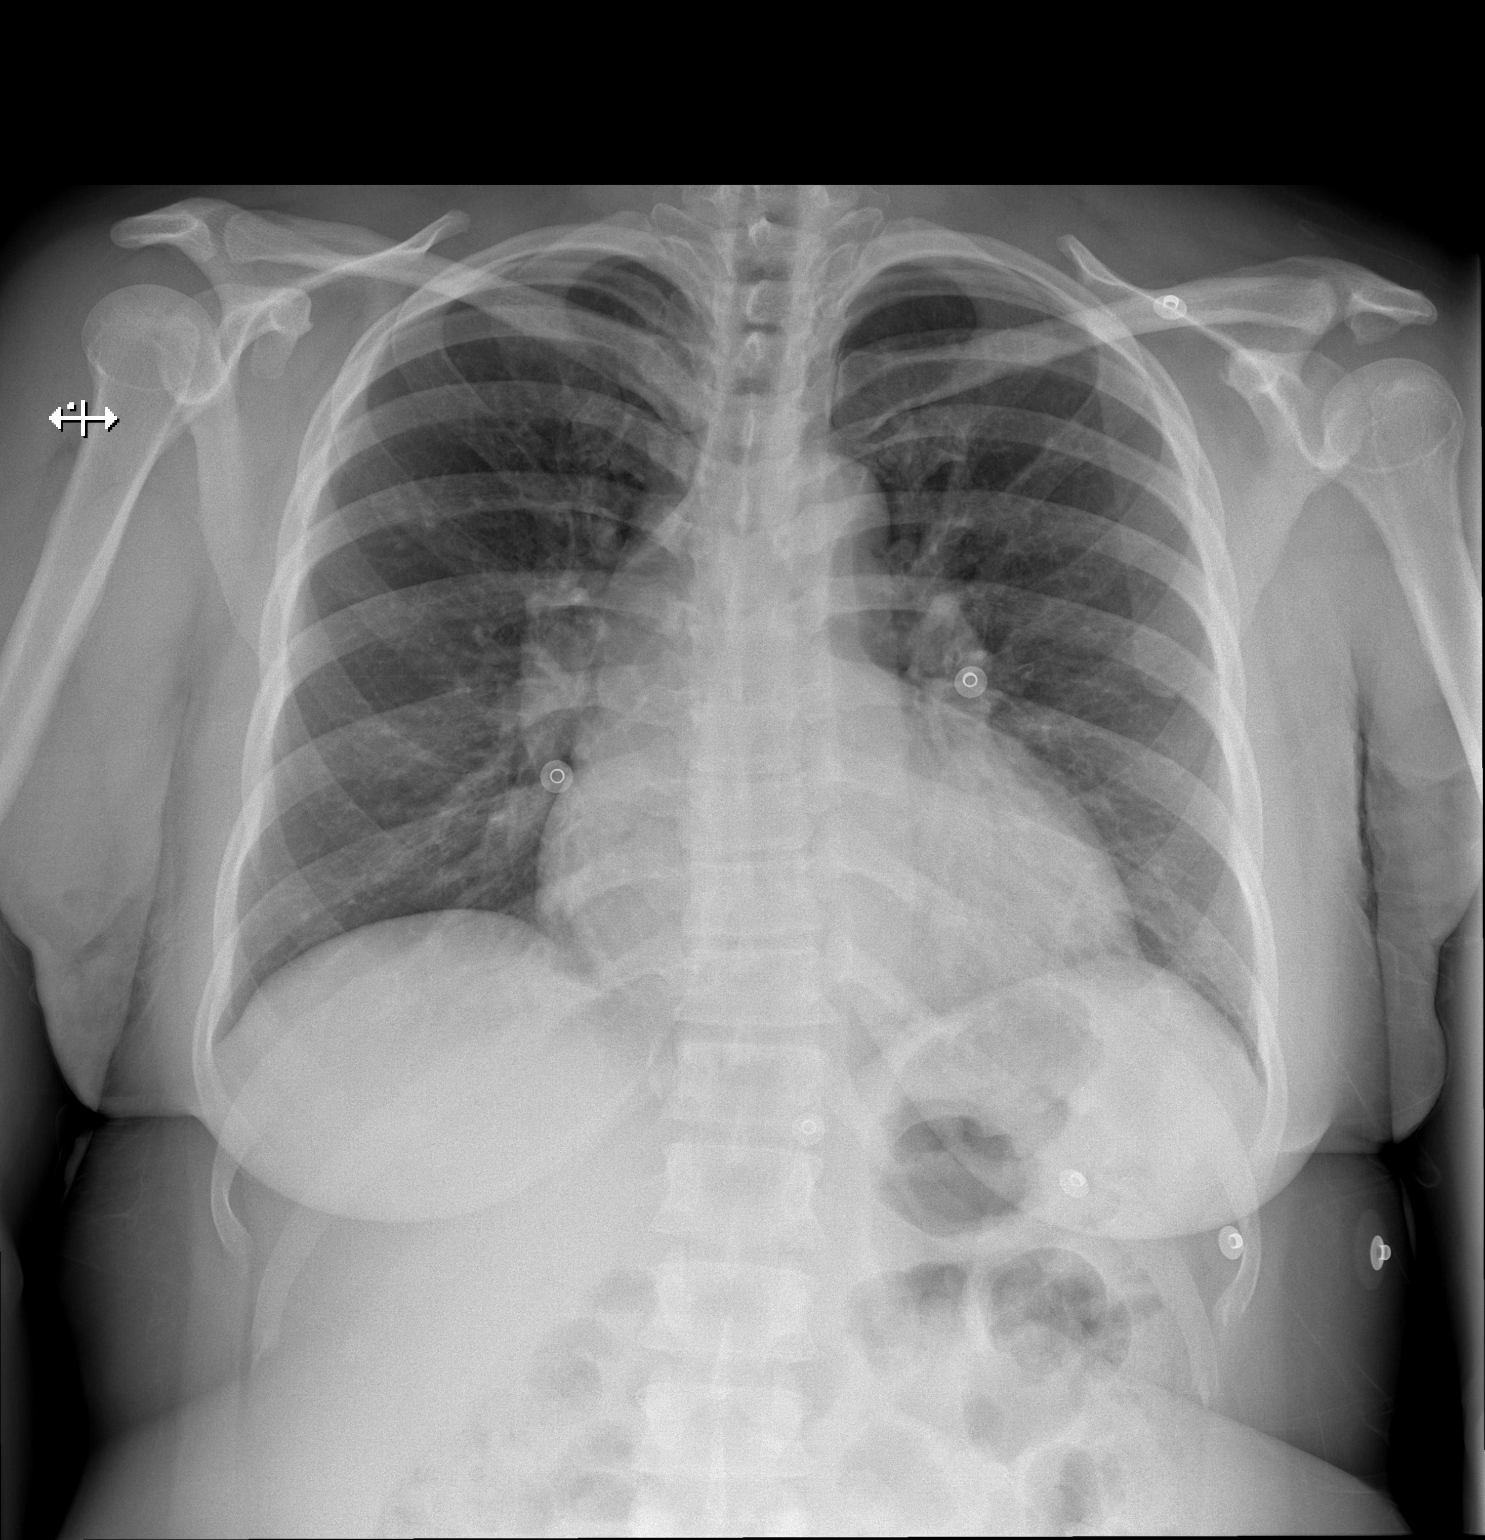

[w chest lat]
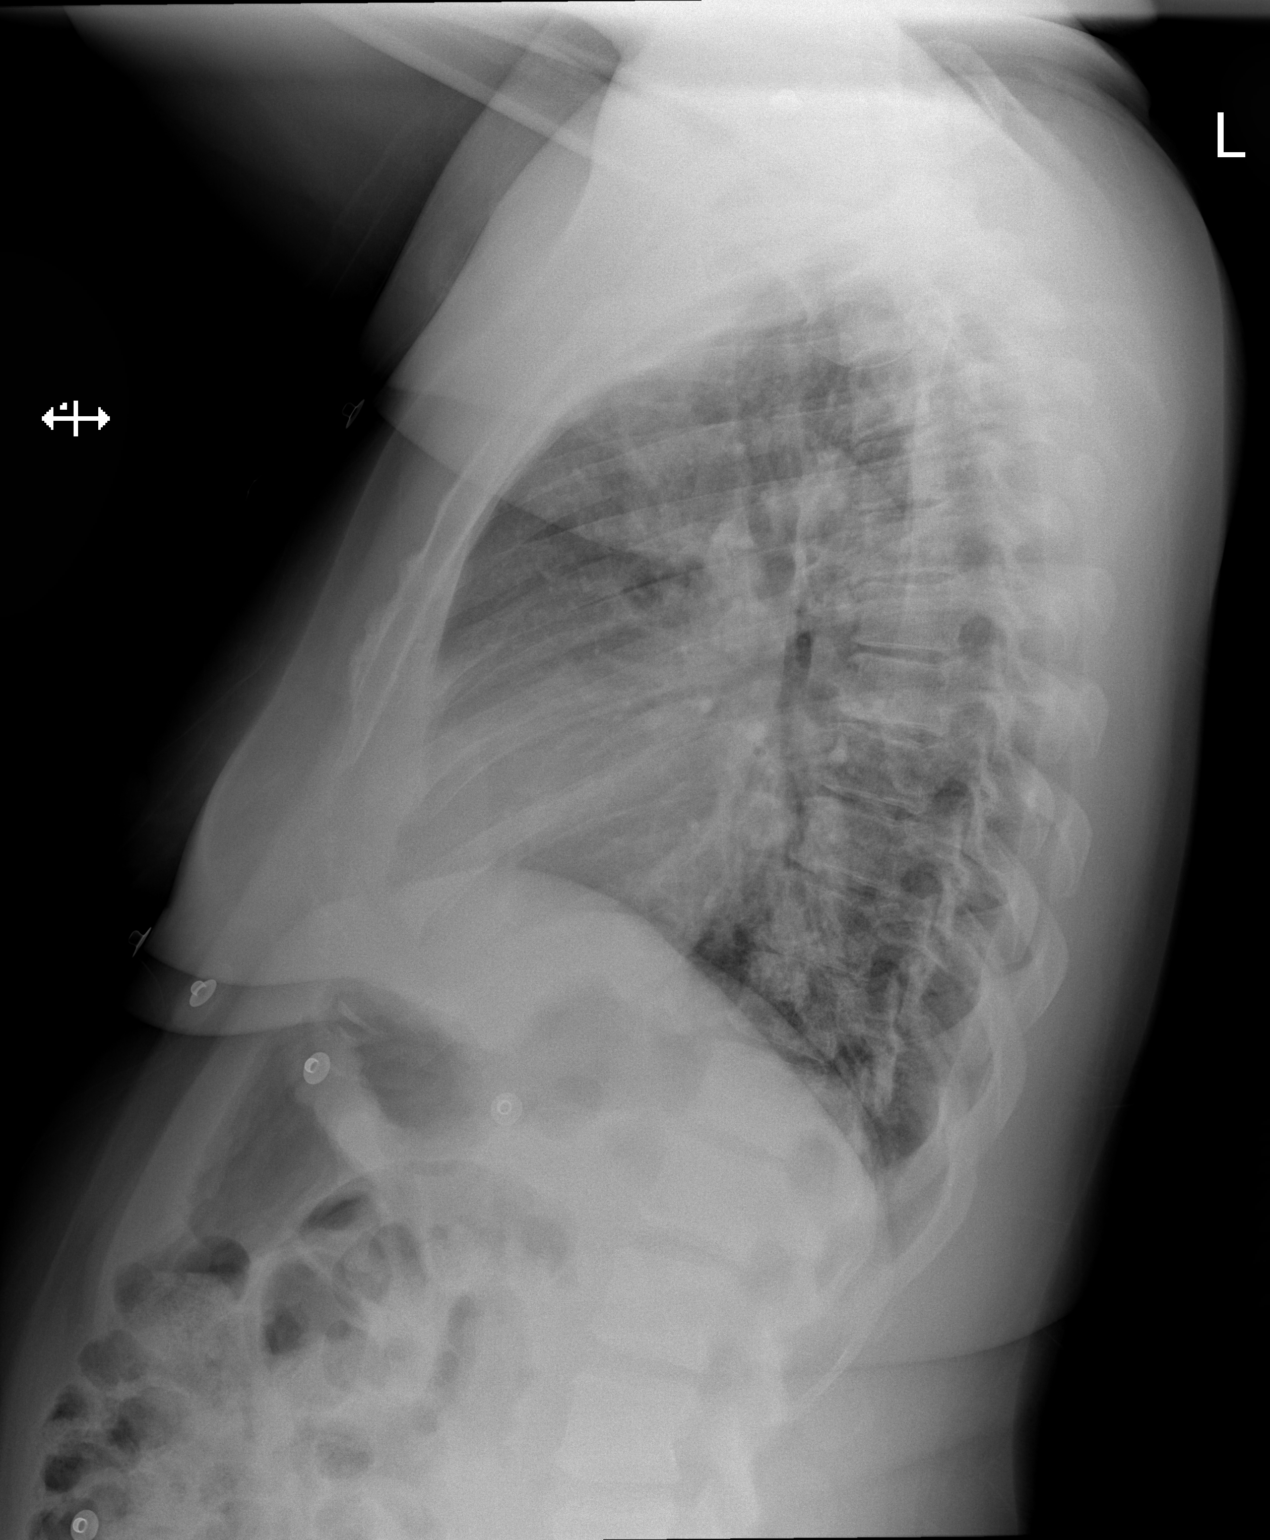

[2 of 2 positions shown; findings below may reference images not displayed]

FINDINGS: The heart and mediastinal contours are within normal limits.

No focal consolidation. No pulmonary edema. No pleural effusion. No
pneumothorax.

No acute osseous abnormality.
IMPRESSION: No active cardiopulmonary disease.

## 2023-06-13 IMAGING — MR MR CARD MORPHOLOGY WO/W CM
45 of 48 series · 45 of 48 positions shown · IV contrast (gadavist)
Comparison: none

CLINICAL DATA: Cardiomyopathy of uncertain etiology

EXAM:
CARDIAC MRI
TECHNIQUE: The patient was scanned on a 1.5 Tesla GE magnet. A dedicated
cardiac coil was used. Functional imaging was done using Fiesta
sequences. [DATE], and 4 chamber views were done to assess for RWMA's.
Modified Hino rule using a short axis stack was used to
calculate an ejection fraction on a dedicated work station using
Circle software. The patient received 8 cc of Gadavist. After 10
minutes inversion recovery sequences were used to assess for
infiltration and scar tissue.

[Series 4: [id number] · axial · 8.0mm · 1.41mm/px · 1 of 16 slices shown]
[im 1/16]
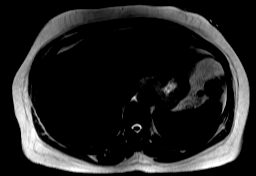

[Series 8: bSSFP · oblique · 8.0mm · 1.61mm/px · 1 of 25 slices shown (1 of 25)]
[im 1/25]
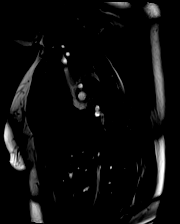

[Series 9: bSSFP · oblique · 8.0mm · 1.61mm/px · 1 of 25 slices shown (2 of 25)]
[im 1/25]
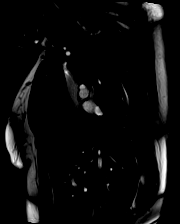

[Series 10: bSSFP · oblique · 8.0mm · 1.61mm/px · 1 of 25 slices shown (3 of 25)]
[im 1/25]
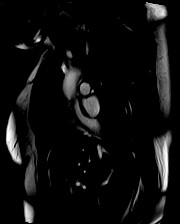

[Series 11: bSSFP · oblique · 8.0mm · 1.61mm/px · 1 of 25 slices shown (4 of 25)]
[im 1/25]
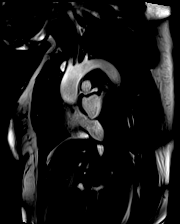

[Series 12: bSSFP · oblique · 8.0mm · 1.61mm/px · 1 of 25 slices shown (5 of 25)]
[im 1/25]
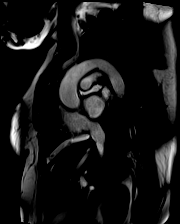

[Series 13: bSSFP · oblique · 8.0mm · 1.61mm/px · 1 of 25 slices shown (6 of 25)]
[im 1/25]
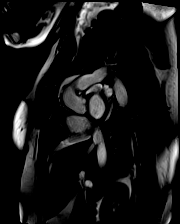

[Series 14: bSSFP · oblique · 8.0mm · 1.61mm/px · 1 of 25 slices shown (7 of 25)]
[im 1/25]
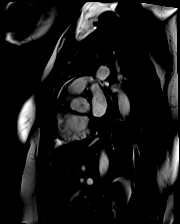

[Series 15: bSSFP · oblique · 8.0mm · 1.61mm/px · 1 of 25 slices shown (8 of 25)]
[im 1/25]
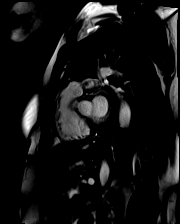

[Series 16: bSSFP · oblique · 8.0mm · 1.61mm/px · 1 of 25 slices shown (9 of 25)]
[im 1/25]
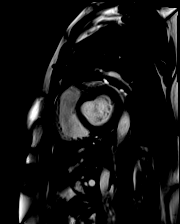

[Series 17: bSSFP · oblique · 8.0mm · 1.61mm/px · 1 of 25 slices shown (10 of 25)]
[im 1/25]
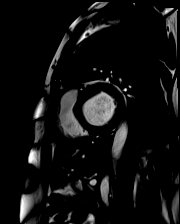

[Series 18: bSSFP · oblique · 8.0mm · 1.61mm/px · 1 of 25 slices shown (11 of 25)]
[im 1/25]
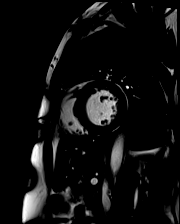

[Series 19: bSSFP · oblique · 8.0mm · 1.61mm/px · 1 of 25 slices shown (12 of 25)]
[im 1/25]
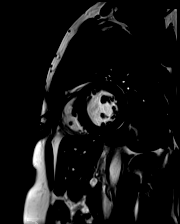

[Series 20: bSSFP · oblique · 8.0mm · 1.61mm/px · 1 of 25 slices shown (13 of 25)]
[im 1/25]
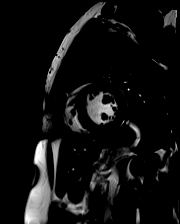

[Series 21: bSSFP · oblique · 8.0mm · 1.61mm/px · 1 of 25 slices shown (14 of 25)]
[im 1/25]
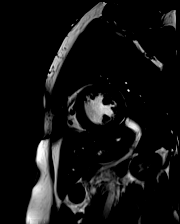

[Series 22: bSSFP · oblique · 8.0mm · 1.61mm/px · 1 of 25 slices shown (15 of 25)]
[im 1/25]
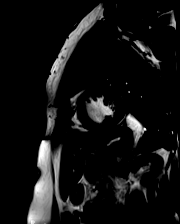

[Series 23: bSSFP · oblique · 8.0mm · 1.61mm/px · 1 of 25 slices shown (16 of 25)]
[im 1/25]
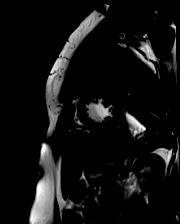

[Series 24: bSSFP · oblique · 8.0mm · 1.61mm/px · 1 of 25 slices shown (17 of 25)]
[im 1/25]
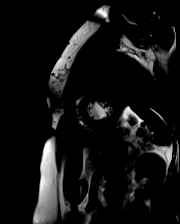

[Series 25: bSSFP · oblique · 8.0mm · 1.61mm/px · 1 of 25 slices shown (18 of 25)]
[im 1/25]
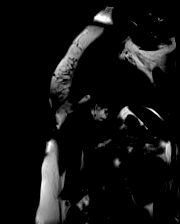

[Series 26: bSSFP · oblique · 8.0mm · 1.61mm/px · 1 of 25 slices shown (19 of 25)]
[im 1/25]
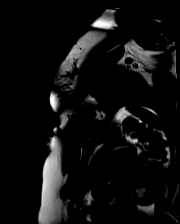

[Series 27: bSSFP · oblique · 8.0mm · 1.61mm/px · 1 of 25 slices shown (20 of 25)]
[im 1/25]
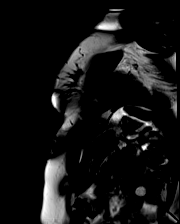

[Series 28: bSSFP · oblique · 8.0mm · 1.61mm/px · 1 of 25 slices shown (21 of 25)]
[im 1/25]
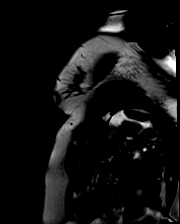

[Series 29: bSSFP · oblique · 8.0mm · 1.61mm/px · 1 of 25 slices shown (22 of 25)]
[im 1/25]
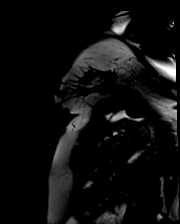

[Series 30: bSSFP · axial · 6.0mm · 1.41mm/px · 1 of 25 slices shown (23 of 25)]
[im 1/25]
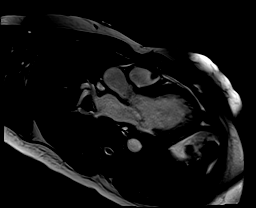

[Series 31: bSSFP · coronal · 6.0mm · 1.41mm/px · 1 of 25 slices shown (24 of 25)]
[im 1/25]
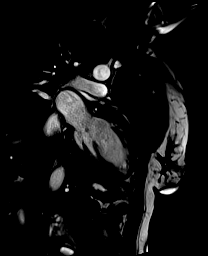

[Series 32: bSSFP · axial · 6.0mm · 1.41mm/px · 1 of 25 slices shown (25 of 25)]
[im 1/25]
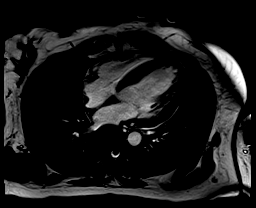

[Series 34: (id)_long_t1 · oblique · 8.0mm · 1.56mm/px · 1 of 24 slices shown]
[im 1/24]
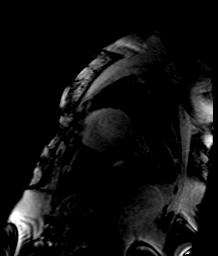

[Series 35: (id)_long_t1_moco · oblique · 8.0mm · 1.56mm/px · 1 of 24 slices shown]
[im 1/24]
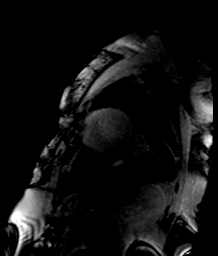

[Series 36: (id)_long_t1_moco_t1 · oblique · 8.0mm · 1.56mm/px · 1 of 6 slices shown]
[im 1/6]
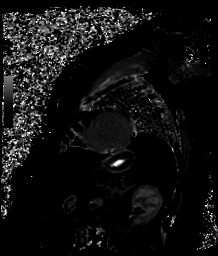

[Series 38: (id)_trufi · oblique · 8.0mm · 2.08mm/px · 1 of 9 slices shown]
[im 1/9]
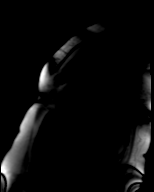

[Series 39: (id)_trufi_moco · oblique · 8.0mm · 2.08mm/px · 1 of 9 slices shown]
[im 1/9]
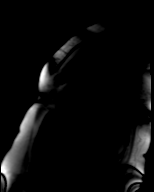

[Series 40: (id)_trufi_moco_t2 · oblique · 8.0mm · 2.08mm/px · 1 of 3 slices shown]
[im 1/3]
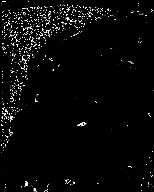

[Series 42: STIR · axial · 8.0mm · 1.92mm/px · 1 of 1 slices shown]
[im 1/1]
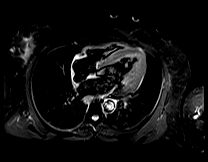

[Series 46: pre short axis · oblique · non-contrast · 8.0mm · 2.25mm/px · 1 of 10 slices shown (1 of 6)]
[im 1/10]
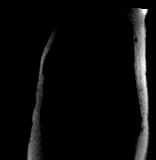

[Series 47: pre short axis · oblique · non-contrast · 8.0mm · 2.25mm/px · 1 of 10 slices shown (2 of 6)]
[im 1/10]
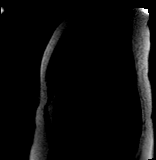

[Series 48: pre short axis · oblique · non-contrast · 8.0mm · 2.25mm/px · 1 of 10 slices shown (3 of 6)]
[im 1/10]
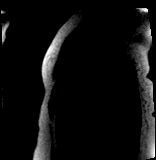

[Series 49: pre short axis · oblique · non-contrast · 8.0mm · 2.25mm/px · 1 of 10 slices shown (4 of 6)]
[im 1/10]
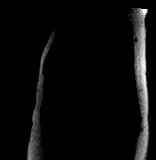

[Series 50: pre short axis · oblique · non-contrast · 8.0mm · 2.25mm/px · 1 of 10 slices shown (5 of 6)]
[im 1/10]
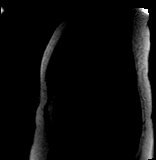

[Series 51: pre short axis · oblique · non-contrast · 8.0mm · 2.25mm/px · 1 of 10 slices shown (6 of 6)]
[im 1/10]
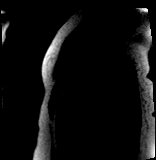

[Series 56: rest short axis · oblique · 8.0mm · 2.25mm/px · 1 of 60 slices shown (1 of 6)]
[im 1/60]
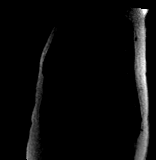

[Series 57: rest short axis · oblique · 8.0mm · 2.25mm/px · 1 of 60 slices shown (2 of 6)]
[im 1/60]
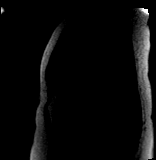

[Series 58: rest short axis · oblique · 8.0mm · 2.25mm/px · 1 of 60 slices shown (3 of 6)]
[im 1/60]
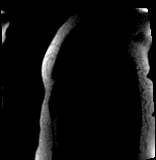

[Series 59: rest short axis · oblique · 8.0mm · 2.25mm/px · 1 of 60 slices shown (4 of 6)]
[im 1/60]
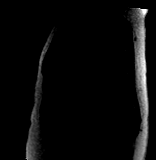

[Series 60: rest short axis · oblique · 8.0mm · 2.25mm/px · 1 of 60 slices shown (5 of 6)]
[im 1/60]
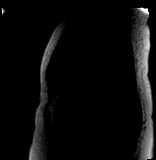

[Series 61: rest short axis · oblique · 8.0mm · 2.25mm/px · 1 of 60 slices shown (6 of 6)]
[im 1/60]
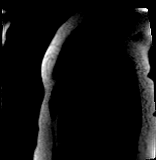

[45 of 48 positions shown; findings below may reference images not displayed]

FINDINGS: Limited images of the lung fields showed no gross abnormalities.

Normal left ventricular size with mild LV hypertrophy. Moderate LV
dysfunction with global hypokinesis, EF 31%. Normal right
ventricular size with mildly decreased systolic function, EF 38%.
Normal right and left atrial sizes. Mild tricuspid regurgitation.
Moderate mitral regurgitation (flow sequences to quantify were not
done). Trileaflet aortic valve with no stenosis or regurgitation.

Delayed enhancement images were difficult due to recent receipt of
Feraheme. However, no evidence for late gadolinium enhancement (LGE)
was visualized.

MEASUREMENTS:
MEASUREMENTS
LVEDV 188 mL

LVSV 58 mL
LVEF 31%

RVEDV 154 mL
RVSV 59
RVEF 38%

T1 indices not likely to be accurate with recent Feraheme
IMPRESSION: 1. Normal LV size with mild LV hypertrophy. EF 31%, diffuse
hypokinesis.

2.  Normal RV size with mild systolic dysfunction, EF 38%.

3. Delayed enhancement images difficult due to recent Feraheme, but
no definite LGE noted.

Hjzaini Descalsota

## 2023-08-18 ENCOUNTER — Encounter (HOSPITAL_COMMUNITY): Payer: Self-pay

## 2023-08-18 ENCOUNTER — Emergency Department (HOSPITAL_COMMUNITY): Payer: BC Managed Care – PPO

## 2023-08-18 ENCOUNTER — Emergency Department (HOSPITAL_COMMUNITY)
Admission: EM | Admit: 2023-08-18 | Discharge: 2023-08-19 | Disposition: A | Discharge status: Home / Self Care | Payer: BC Managed Care – PPO | Attending: Emergency Medicine | Admitting: Emergency Medicine

## 2023-08-18 DIAGNOSIS — R0602 Shortness of breath: Secondary | ICD-10-CM | POA: Diagnosis not present

## 2023-08-18 DIAGNOSIS — Z79899 Other long term (current) drug therapy: Secondary | ICD-10-CM | POA: Diagnosis not present

## 2023-08-18 DIAGNOSIS — J069 Acute upper respiratory infection, unspecified: Secondary | ICD-10-CM | POA: Diagnosis not present

## 2023-08-18 DIAGNOSIS — I110 Hypertensive heart disease with heart failure: Secondary | ICD-10-CM | POA: Insufficient documentation

## 2023-08-18 DIAGNOSIS — I5021 Acute systolic (congestive) heart failure: Secondary | ICD-10-CM | POA: Insufficient documentation

## 2023-08-18 DIAGNOSIS — Z20822 Contact with and (suspected) exposure to covid-19: Secondary | ICD-10-CM | POA: Diagnosis not present

## 2023-08-18 DIAGNOSIS — B9789 Other viral agents as the cause of diseases classified elsewhere: Secondary | ICD-10-CM | POA: Diagnosis not present

## 2023-08-18 DIAGNOSIS — I517 Cardiomegaly: Secondary | ICD-10-CM | POA: Diagnosis not present

## 2023-08-18 DIAGNOSIS — R918 Other nonspecific abnormal finding of lung field: Secondary | ICD-10-CM | POA: Diagnosis not present

## 2023-08-18 DIAGNOSIS — R059 Cough, unspecified: Secondary | ICD-10-CM | POA: Diagnosis not present

## 2023-08-18 HISTORY — DX: Sleep apnea, unspecified: G47.30

## 2023-08-18 LAB — RESP PANEL BY RT-PCR (RSV, FLU A&B, COVID)  RVPGX2
Influenza A by PCR: NEGATIVE
Influenza B by PCR: NEGATIVE
Resp Syncytial Virus by PCR: NEGATIVE
SARS Coronavirus 2 by RT PCR: NEGATIVE

## 2023-08-18 MED ORDER — ALBUTEROL SULFATE HFA 108 (90 BASE) MCG/ACT IN AERS
2.0000 | INHALATION_SPRAY | RESPIRATORY_TRACT | Status: DC | PRN
  Administered 2023-08-18: 2 via RESPIRATORY_TRACT
  Filled 2023-08-18: qty 6.7

## 2023-08-18 NOTE — ED Triage Notes (Signed)
Pt arrived POV for cough for several days with productive cough, yellow, phlegm, pt reports dx with sleep apnea a year ago, but has not gotten her machine yet. SOB and cought worsen when laying down, has got to sleep sitting up.  Pt reports sore throat "due to my cough". Denies CP, no nasal symptoms, no n/v/d. HTN noted, 198/132, Pt st has not taken her BP meds this evening, will take while she awaits treatment area. A&O x4, otherwise VSS.

## 2023-08-19 MED ORDER — OXYMETAZOLINE HCL 0.05 % NA SOLN: 1.0000 | Freq: Two times a day (BID) | NASAL | 0 refills | Status: AC

## 2023-08-19 MED ORDER — LEVALBUTEROL TARTRATE 45 MCG/ACT IN AERO: 1.0000 | INHALATION_SPRAY | Freq: Four times a day (QID) | RESPIRATORY_TRACT | 0 refills | Status: AC | PRN

## 2023-08-19 MED ORDER — GUAIFENESIN-CODEINE 100-10 MG/5ML PO SOLN
5.0000 mL | Freq: Once | ORAL | Status: AC
  Administered 2023-08-19: 5 mL via ORAL
  Filled 2023-08-19: qty 5

## 2023-08-19 MED ORDER — CARVEDILOL 12.5 MG PO TABS
25.0000 mg | ORAL_TABLET | Freq: Once | ORAL | Status: AC
  Administered 2023-08-19: 25 mg via ORAL
  Filled 2023-08-19: qty 2

## 2023-08-19 MED ORDER — IPRATROPIUM-ALBUTEROL 0.5-2.5 (3) MG/3ML IN SOLN
3.0000 mL | Freq: Once | RESPIRATORY_TRACT | Status: AC
  Administered 2023-08-19: 3 mL via RESPIRATORY_TRACT
  Filled 2023-08-19: qty 3

## 2023-08-19 MED ORDER — GUAIFENESIN-CODEINE 100-10 MG/5ML PO SOLN: 5.0000 mL | Freq: Three times a day (TID) | ORAL | 0 refills | Status: DC | PRN

## 2023-08-19 NOTE — ED Provider Notes (Signed)
EMERGENCY DEPARTMENT AT [facility] Provider Note  CSN: [id number] Arrival date & time: 08/18/23 1946  Chief Complaint(s) Cough  HPI Alice Hays is a 42 y.o. female with past medical history as below, significant for hypertension, OSA, HLD, cardiomyopathy, systolic heart failure who presents to the ED with complaint of cough  She reports nonproductive cough over the past 4 days, no fevers or chills, no nausea or vomiting.  Has been taking DayQuil and NyQuil without much relief of her symptoms.  No sick contacts recent travel.  No unexpected weight changes.  No significant dyspnea or chest pain.  Reports good compliance with home medications.  Past Medical History Past Medical History:  Diagnosis Date   Anemia    Ectopic pregnancy    L adnexa   Hx of varicella    Hypertension    Sleep apnea    Patient Active Problem List   Diagnosis Date Noted   Hyperlipidemia 03/26/2022   Mediastinal mass    Cardiomyopathy [facility])    Liver cyst    Acute systolic CHF (congestive heart failure) (HCC)    Acute congestive heart failure (HCC) 11/29/2021   Miscarriage 01/28/2021   Primary hypertension 01/02/2019   Labor abnormal 06/29/2016    Postpartum care s/p cesarean section (9/2) 06/29/2016   Home Medication(s) Prior to Admission medications   Medication Sig Start Date End Date Taking? Authorizing Provider  guaiFENesin-codeine 100-10 MG/5ML syrup Take 5 mLs by mouth 3 (three) times daily as needed for cough. 08/19/23  Yes Sloan Leiter, DO  levalbuterol [facility] HFA) 45 MCG/ACT inhaler Inhale 1-2 puffs into the lungs every 6 (six) hours as needed for wheezing. 08/19/23  Yes Tanda Rockers A, DO  oxymetazoline (AFRIN NASAL SPRAY) 0.05 % nasal spray Place 1 spray into both nostrils 2 (two) times daily for 3 days. 08/19/23 08/22/23 Yes Tanda Rockers A, DO  amLODipine (NORVASC) 10 MG tablet Take 1 tablet (10 mg total) by mouth daily. 04/17/22   Laurey Morale, MD   atorvastatin (LIPITOR) 80 MG tablet Take 1 tablet (80 mg total) by mouth daily. 04/03/22   Laurey Morale, MD  carvedilol (COREG) 25 MG tablet Take 1 tablet (25 mg total) by mouth 2 (two) times daily with a meal. 04/17/22   Laurey Morale, MD  dapagliflozin propanediol (FARXIGA) 10 MG TABS tablet Take 1 tablet (10 mg total) by mouth daily before breakfast. 12/07/21   Milford, Anderson Malta, FNP  doxycycline (VIBRAMYCIN) 100 MG capsule Take 1 capsule (100 mg total) by mouth 2 (two) times daily. 01/09/23   Jeannie Fend, PA-C  ferrous sulfate 325 (65 FE) MG tablet Take 1 tablet (325 mg total) by mouth every other day. 12/04/21   Levin Erp, MD  isosorbide-hydrALAZINE (BIDIL) 20-37.5 MG tablet Take 1 tablet by mouth 3 (three) times daily. 06/25/22   Jacklynn Ganong, FNP  Multiple Vitamin (MV-ONE PO) Take 1 capsule by mouth every morning.    [provider]  potassium chloride SA (KLOR-CON M) 20 MEQ tablet Take 1 tablet (20 mEq total) by mouth daily. Take 40 meq x 1 dose today and 20 meq daily thereafter. 06/26/22   Milford, Anderson Malta, FNP  sacubitril-valsartan (ENTRESTO) 97-103 MG Take 1 tablet by mouth 2 (two) times daily. NEEDS FOLLOW UP APPOINTMENT FOR ANYMORE REFILLS. 02/05/23   Jacklynn Ganong, FNP  spironolactone (ALDACTONE) 25 MG tablet Take 1 tablet (25 mg total) by mouth daily. 12/07/21   Jacklynn Ganong, FNP  Past Surgical History Past Surgical History:  Procedure Laterality Date   ARM SURGERY     CESAREAN SECTION N/A 06/29/2016   Procedure: CESAREAN SECTION;  Surgeon: Maxie Better, MD;  Location: [facility];  Service: Obstetrics;  Laterality: N/A;   RIGHT/LEFT HEART CATH AND CORONARY ANGIOGRAPHY N/A 11/30/2021   Procedure: RIGHT/LEFT HEART CATH AND CORONARY ANGIOGRAPHY;  Surgeon: Laurey Morale, MD;  Location: [facility] INVASIVE CV LAB;   Service: Cardiovascular;  Laterality: N/A;   Family History Family History  Problem Relation Age of Onset   Cancer Mother    Heart disease Mother    Heart disease Father    Hypertension Father    Stroke Father    Cancer Maternal Aunt    Diabetes Maternal Grandmother     Social History Social History   Tobacco Use   Smoking status: Never   Smokeless tobacco: Never  Vaping Use   Vaping status: Never Used  Substance Use Topics   Alcohol use: No   Drug use: No   Allergies Shellfish allergy  Review of Systems Review of Systems  Constitutional:  Negative for chills and fever.  Respiratory:  Positive for cough. Negative for chest tightness and shortness of breath.   Cardiovascular:  Negative for chest pain and palpitations.  Gastrointestinal:  Negative for abdominal pain, nausea and vomiting.  Musculoskeletal:  Negative for arthralgias.  Skin:  Negative for rash.  Neurological:  Negative for light-headedness.  All other systems reviewed and are negative.   Physical Exam Vital Signs  I have reviewed the triage vital signs BP (!) 174/111   Pulse 84   Temp 98.4 F (36.9 C) (Oral)   Resp 18   Ht 4\' 10"  (1.473 m)   Wt 79.4 kg   LMP 08/10/2023 (Exact Date)   SpO2 100%   BMI 36.58 kg/m  Physical Exam Vitals and nursing note reviewed.  Constitutional:      General: She is not in acute distress.    Appearance: Normal appearance.  HENT:     Head: Normocephalic and atraumatic.     Right Ear: External ear normal.     Left Ear: External ear normal.     Nose: Nose normal.     Mouth/Throat:     Mouth: Mucous membranes are moist.  Eyes:     General: No scleral icterus.       Right eye: No discharge.        Left eye: No discharge.  Cardiovascular:     Rate and Rhythm: Normal rate and regular rhythm.     Pulses: Normal pulses.     Heart sounds: Normal heart sounds.  Pulmonary:     Effort: Pulmonary effort is normal. No respiratory distress.     Breath sounds:  Normal breath sounds. No stridor.  Abdominal:     General: Abdomen is flat. There is no distension.     Palpations: Abdomen is soft.     Tenderness: There is no abdominal tenderness.  Musculoskeletal:     Cervical back: No rigidity.     Right lower leg: No edema.     Left lower leg: No edema.  Skin:    General: Skin is warm and dry.     Capillary Refill: Capillary refill takes less than 2 seconds.  Neurological:     Mental Status: She is alert.  Psychiatric:        Mood and Affect: Mood normal.        Behavior: Behavior normal. Behavior  is cooperative.     ED Results and Treatments Labs (all labs ordered are listed, but only abnormal results are displayed) Labs Reviewed  RESP PANEL BY RT-PCR (RSV, FLU A&B, COVID)  RVPGX2                                                                                                                          Radiology DG Chest 2 View  Result Date: 08/18/2023 CLINICAL DATA:  Congestion wheezing EXAM: CHEST - 2 VIEW COMPARISON:  01/08/2023, CT 11/28/2021 FINDINGS: Cardiomegaly. No pleural effusion. Airways thickening in the perihilar regions. Bilateral hilar fullness potentially due to vascularity but difficult to exclude nodes. No pneumothorax IMPRESSION: 1. Cardiomegaly. 2. Airways thickening in the perihilar regions, question bronchitis. 3. Bilateral hilar fullness potentially due to vascularity but difficult to exclude adenopathy Electronically Signed   By: Jasmine Pang M.D.   On: 08/18/2023 22:51    Pertinent labs & imaging results that were available during my care of the patient were reviewed by me and considered in my medical decision making (see MDM for details).  Medications Ordered in ED Medications  albuterol (VENTOLIN HFA) 108 (90 Base) MCG/ACT inhaler 2 puff (2 puffs Inhalation Given 08/18/23 2034)  ipratropium-albuterol (DUONEB) 0.5-2.5 (3) MG/3ML nebulizer solution 3 mL (3 mLs Nebulization Given 08/19/23 0253)  guaiFENesin-codeine  100-10 MG/5ML solution 5 mL (5 mLs Oral Given 08/19/23 0311)  carvedilol (COREG) tablet 25 mg (25 mg Oral Given 08/19/23 5409)                                                                                                                                     Procedures Procedures  (including critical care time)  Medical Decision Making / ED Course    Medical Decision Making:    MARTIZA RASK is a 42 y.o. female  with past medical history as below, significant for hypertension, OSA, HLD, cardiomyopathy, systolic heart failure who presents to the ED with complaint of cough. The complaint involves an extensive differential diagnosis and also carries with it a high risk of complications and morbidity.  Serious etiology was considered. Ddx includes but is not limited to: URI, viral syndrome, pneumonia, CHF, bronchitis, etc.  Complete initial physical exam performed, notably the patient  was no acute distress, no hypoxia noted, does not appear to be volume overloaded on exam.  Blood pressure elevated.  Reviewed and confirmed nursing documentation for past medical history, family history, social history.  Vital signs reviewed.        She is well-appearing on exam.  Lungs clear bilateral no hypoxia, blood pressure is elevated, did not take nighttime blood pressure medication, will give p.o.  Patient with cough for 4 days, nonproductive, x-ray clear, RVP clear.  Give nebulized breathing treatment and antitussive  Feeling much better on recheck  Likely viral syndrome, possibly developing bronchitis; supportive care at home was emphasized.  Recommend close follow-up with PCP     The patient improved significantly and was discharged in stable condition. Detailed discussions were had with the patient regarding current findings, and need for close f/u with PCP or on call doctor. The patient has been instructed to return immediately if the symptoms worsen in any way for re-evaluation.  Patient verbalized understanding and is in agreement with current care plan. All questions answered prior to discharge.                   Additional history obtained: -Additional history obtained from na -External records from outside source obtained and reviewed including: Chart review including previous notes, labs, imaging, consultation notes including  Prior ED visits, prior medications, imaging of prior    Lab Tests: -I ordered, reviewed, and interpreted labs.   The pertinent results include:   Labs Reviewed  RESP PANEL BY RT-PCR (RSV, FLU A&B, COVID)  RVPGX2    Notable for RVP negative  EKG   EKG Interpretation Date/Time:  Monday August 18 2023 20:24:19 EDT Ventricular Rate:  92 PR Interval:  170 QRS Duration:  80 QT Interval:  388 QTC Calculation: 479 R Axis:   -29  Text Interpretation: Normal sinus rhythm Biatrial enlargement Septal infarct , age undetermined Abnormal ECG When compared with ECG of 09-Jan-2023 03:42, PREVIOUS ECG IS PRESENT no stemi similar to prior ekg Confirmed by Tanda Rockers ([id number]) on 08/19/2023 1:00:47 AM         Imaging Studies ordered: I ordered imaging studies including CXR I independently visualized the following imaging with scope of interpretation limited to determining acute life threatening conditions related to emergency care; findings noted above I independently visualized and interpreted imaging. I agree with the radiologist interpretation   Medicines ordered and prescription drug management: Meds ordered this encounter  Medications   albuterol (VENTOLIN HFA) 108 (90 Base) MCG/ACT inhaler 2 puff   ipratropium-albuterol (DUONEB) 0.5-2.5 (3) MG/3ML nebulizer solution 3 mL   guaiFENesin-codeine 100-10 MG/5ML solution 5 mL   carvedilol (COREG) tablet 25 mg   guaiFENesin-codeine 100-10 MG/5ML syrup    Sig: Take 5 mLs by mouth 3 (three) times daily as needed for cough.    Dispense:  120 mL    Refill:  0    levalbuterol (XOPENEX HFA) 45 MCG/ACT inhaler    Sig: Inhale 1-2 puffs into the lungs every 6 (six) hours as needed for wheezing.    Dispense:  15 g    Refill:  0   oxymetazoline (AFRIN NASAL SPRAY) 0.05 % nasal spray    Sig: Place 1 spray into both nostrils 2 (two) times daily for 3 days.    Dispense:  15 mL    Refill:  0    -I have reviewed the patients home medicines and have made adjustments as needed   Consultations Obtained: na   Cardiac Monitoring: Continuous pulse oximetry interpreted by myself, 99% on RA.    Social Determinants of Health:  Diagnosis or  treatment significantly limited by social determinants of health: lives at home, lacks transportation , no pcp   Reevaluation: After the interventions noted above, I reevaluated the patient and found that they have improved  Co morbidities that complicate the patient evaluation  Past Medical History:  Diagnosis Date   Anemia    Ectopic pregnancy    L adnexa   Hx of varicella    Hypertension    Sleep apnea       Dispostion: Disposition decision including need for hospitalization was considered, and patient discharged from emergency department.    Final Clinical Impression(s) / ED Diagnoses Final diagnoses:  Viral URI with cough        Sloan Leiter, DO 08/19/23 0354

## 2023-08-19 NOTE — Discharge Instructions (Signed)
It was a pleasure caring for you today in the emergency department.  Your cough is likely secondary to a virus, you may be developing bronchitis.  Please take antitussive as directed.  This medication has a opiate mixed with it that may impair your ability to drive or operate heavy machinery.  Drink plenty of fluids, get plenty of rest.  Follow-up with your PCP  Please return to the emergency department for any worsening or worrisome symptoms.

## 2023-08-24 ENCOUNTER — Emergency Department (HOSPITAL_COMMUNITY): Payer: BC Managed Care – PPO

## 2023-08-24 ENCOUNTER — Other Ambulatory Visit: Payer: Self-pay

## 2023-08-24 ENCOUNTER — Inpatient Hospital Stay (HOSPITAL_COMMUNITY)
Admission: EM | Admit: 2023-08-24 | Discharge: 2023-08-29 | DRG: 291 | Disposition: A | Discharge status: Home / Self Care | Payer: BC Managed Care – PPO | Attending: Internal Medicine | Admitting: Internal Medicine

## 2023-08-24 DIAGNOSIS — I083 Combined rheumatic disorders of mitral, aortic and tricuspid valves: Secondary | ICD-10-CM | POA: Diagnosis not present

## 2023-08-24 DIAGNOSIS — I5023 Acute on chronic systolic (congestive) heart failure: Secondary | ICD-10-CM | POA: Diagnosis not present

## 2023-08-24 DIAGNOSIS — I10 Essential (primary) hypertension: Secondary | ICD-10-CM | POA: Diagnosis not present

## 2023-08-24 DIAGNOSIS — Z809 Family history of malignant neoplasm, unspecified: Secondary | ICD-10-CM | POA: Diagnosis not present

## 2023-08-24 DIAGNOSIS — I160 Hypertensive urgency: Principal | ICD-10-CM

## 2023-08-24 DIAGNOSIS — R7989 Other specified abnormal findings of blood chemistry: Secondary | ICD-10-CM

## 2023-08-24 DIAGNOSIS — I5031 Acute diastolic (congestive) heart failure: Secondary | ICD-10-CM | POA: Diagnosis not present

## 2023-08-24 DIAGNOSIS — J9601 Acute respiratory failure with hypoxia: Secondary | ICD-10-CM

## 2023-08-24 DIAGNOSIS — I517 Cardiomegaly: Secondary | ICD-10-CM | POA: Diagnosis not present

## 2023-08-24 DIAGNOSIS — I2489 Other forms of acute ischemic heart disease: Secondary | ICD-10-CM | POA: Diagnosis not present

## 2023-08-24 DIAGNOSIS — Z823 Family history of stroke: Secondary | ICD-10-CM

## 2023-08-24 DIAGNOSIS — R062 Wheezing: Secondary | ICD-10-CM | POA: Diagnosis not present

## 2023-08-24 DIAGNOSIS — I110 Hypertensive heart disease with heart failure: Secondary | ICD-10-CM | POA: Diagnosis not present

## 2023-08-24 DIAGNOSIS — E876 Hypokalemia: Secondary | ICD-10-CM | POA: Diagnosis present

## 2023-08-24 DIAGNOSIS — I420 Dilated cardiomyopathy: Secondary | ICD-10-CM | POA: Diagnosis present

## 2023-08-24 DIAGNOSIS — Z91013 Allergy to seafood: Secondary | ICD-10-CM | POA: Diagnosis not present

## 2023-08-24 DIAGNOSIS — Z833 Family history of diabetes mellitus: Secondary | ICD-10-CM | POA: Diagnosis not present

## 2023-08-24 DIAGNOSIS — Z79899 Other long term (current) drug therapy: Secondary | ICD-10-CM | POA: Diagnosis not present

## 2023-08-24 DIAGNOSIS — E785 Hyperlipidemia, unspecified: Secondary | ICD-10-CM | POA: Diagnosis not present

## 2023-08-24 DIAGNOSIS — Z8249 Family history of ischemic heart disease and other diseases of the circulatory system: Secondary | ICD-10-CM | POA: Diagnosis not present

## 2023-08-24 DIAGNOSIS — D638 Anemia in other chronic diseases classified elsewhere: Secondary | ICD-10-CM | POA: Diagnosis present

## 2023-08-24 DIAGNOSIS — I5033 Acute on chronic diastolic (congestive) heart failure: Secondary | ICD-10-CM | POA: Diagnosis present

## 2023-08-24 DIAGNOSIS — Z1152 Encounter for screening for COVID-19: Secondary | ICD-10-CM

## 2023-08-24 DIAGNOSIS — I161 Hypertensive emergency: Secondary | ICD-10-CM

## 2023-08-24 DIAGNOSIS — G4733 Obstructive sleep apnea (adult) (pediatric): Secondary | ICD-10-CM | POA: Diagnosis present

## 2023-08-24 DIAGNOSIS — I509 Heart failure, unspecified: Secondary | ICD-10-CM

## 2023-08-24 DIAGNOSIS — Z7984 Long term (current) use of oral hypoglycemic drugs: Secondary | ICD-10-CM

## 2023-08-24 DIAGNOSIS — I493 Ventricular premature depolarization: Secondary | ICD-10-CM | POA: Diagnosis present

## 2023-08-24 DIAGNOSIS — R000 Tachycardia, unspecified: Secondary | ICD-10-CM | POA: Diagnosis not present

## 2023-08-24 DIAGNOSIS — R0689 Other abnormalities of breathing: Secondary | ICD-10-CM | POA: Diagnosis not present

## 2023-08-24 DIAGNOSIS — R0602 Shortness of breath: Secondary | ICD-10-CM | POA: Diagnosis not present

## 2023-08-24 DIAGNOSIS — R0989 Other specified symptoms and signs involving the circulatory and respiratory systems: Secondary | ICD-10-CM | POA: Diagnosis not present

## 2023-08-24 LAB — RESP PANEL BY RT-PCR (RSV, FLU A&B, COVID)  RVPGX2
Influenza A by PCR: NEGATIVE
Influenza B by PCR: NEGATIVE
Resp Syncytial Virus by PCR: NEGATIVE
SARS Coronavirus 2 by RT PCR: NEGATIVE

## 2023-08-24 LAB — COMPREHENSIVE METABOLIC PANEL
ALT: 36 U/L (ref 0–44)
AST: 35 U/L (ref 15–41)
Albumin: 3.1 g/dL — ABNORMAL LOW (ref 3.5–5.0)
Alkaline Phosphatase: 66 U/L (ref 38–126)
Anion gap: 13 (ref 5–15)
BUN: 17 mg/dL (ref 6–20)
CO2: 22 mmol/L (ref 22–32)
Calcium: 8.5 mg/dL — ABNORMAL LOW (ref 8.9–10.3)
Chloride: 101 mmol/L (ref 98–111)
Creatinine, Ser: 1.08 mg/dL — ABNORMAL HIGH (ref 0.44–1.00)
GFR, Estimated: >60 mL/min (ref >60)
Glucose, Bld: 114 mg/dL — ABNORMAL HIGH (ref 70–99)
Potassium: 3.7 mmol/L (ref 3.5–5.1)
Sodium: 136 mmol/L (ref 135–145)
Total Bilirubin: 0.7 mg/dL (ref 0.3–1.2)
Total Protein: 6.1 g/dL — ABNORMAL LOW (ref 6.5–8.1)

## 2023-08-24 LAB — CBC WITH DIFFERENTIAL/PLATELET
Abs Immature Granulocytes: 0.03 10*3/uL (ref 0.00–0.07)
Basophils Absolute: 0.1 10*3/uL (ref 0.0–0.1)
Basophils Relative: 1 %
Eosinophils Absolute: 0.4 10*3/uL (ref 0.0–0.5)
Eosinophils Relative: 5 %
HCT: 42.5 % (ref 36.0–46.0)
Hemoglobin: 13.1 g/dL (ref 12.0–15.0)
Immature Granulocytes: 0 %
Lymphocytes Relative: 37 %
Lymphs Abs: 3.1 10*3/uL (ref 0.7–4.0)
MCH: 27.3 pg (ref 26.0–34.0)
MCHC: 30.8 g/dL (ref 30.0–36.0)
MCV: 88.7 fL (ref 80.0–100.0)
Monocytes Absolute: 0.9 10*3/uL (ref 0.1–1.0)
Monocytes Relative: 11 %
Neutro Abs: 3.8 10*3/uL (ref 1.7–7.7)
Neutrophils Relative %: 46 %
Platelets: 329 10*3/uL (ref 150–400)
RBC: 4.79 MIL/uL (ref 3.87–5.11)
RDW: 16.8 % — ABNORMAL HIGH (ref 11.5–15.5)
WBC: 8.3 10*3/uL (ref 4.0–10.5)
nRBC: 0 % (ref 0.0–0.2)

## 2023-08-24 LAB — I-STAT VENOUS BLOOD GAS, ED
Acid-base deficit: 1 mmol/L (ref 0.0–2.0)
Bicarbonate: 22.2 mmol/L (ref 20.0–28.0)
Calcium, Ion: 1.02 mmol/L — ABNORMAL LOW (ref 1.15–1.40)
HCT: 43 % (ref 36.0–46.0)
Hemoglobin: 14.6 g/dL (ref 12.0–15.0)
O2 Saturation: 98 %
Potassium: 3.8 mmol/L (ref 3.5–5.1)
Sodium: 137 mmol/L (ref 135–145)
TCO2: 23 mmol/L (ref 22–32)
pCO2, Ven: 31.3 mm[Hg] — ABNORMAL LOW (ref 44–60)
pH, Ven: 7.46 — ABNORMAL HIGH (ref 7.25–7.43)
pO2, Ven: 97 mm[Hg] — ABNORMAL HIGH (ref 32–45)

## 2023-08-24 LAB — BRAIN NATRIURETIC PEPTIDE: B Natriuretic Peptide: 2536.7 pg/mL — ABNORMAL HIGH (ref 0.0–100.0)

## 2023-08-24 LAB — TROPONIN I (HIGH SENSITIVITY): Troponin I (High Sensitivity): 42 ng/L — ABNORMAL HIGH (ref <18)

## 2023-08-24 MED ORDER — NITROGLYCERIN IN D5W 200-5 MCG/ML-% IV SOLN
0.0000 ug/min | INTRAVENOUS | Status: DC
  Administered 2023-08-24: 5 ug/min via INTRAVENOUS
  Administered 2023-08-25: 200 ug/min via INTRAVENOUS
  Administered 2023-08-25: 160 ug/min via INTRAVENOUS
  Administered 2023-08-25: 170 ug/min via INTRAVENOUS
  Filled 2023-08-24 (×4): qty 250

## 2023-08-24 MED ORDER — FUROSEMIDE 10 MG/ML IJ SOLN
40.0000 mg | Freq: Once | INTRAMUSCULAR | Status: AC
  Administered 2023-08-24: 40 mg via INTRAVENOUS
  Filled 2023-08-24: qty 4

## 2023-08-24 NOTE — H&P (Signed)
History and Physical    Alice Hays ZOX:[id number] DOB: [birth year] DOA: 08/24/2023  PCP: Pcp, No  Patient coming from: Home  Chief Complaint: Shortness of breath  HPI: Alice Hays is a 42 y.o. female with medical history significant of CHF/nonischemic cardiomyopathy, hypertension, history of multiple miscarriages/spontaneous abortions, anterior mediastinal mass, OSA on CPAP, hyperlipidemia presented to the ED with shortness of breath.  She was given Solu-Medrol 125 mg and albuterol/Atrovent nebs by EMS prior to arrival.  No history of asthma/COPD.  Oxygen saturation in the low 80s.  Tachycardic and tachypneic.  Placed on BiPAP on arrival to the ED due to respiratory distress.  Blood pressure elevated to 220/150.  Labs significant for BNP 2536, creatinine 1.0, troponin 42, COVID/influenza/RSV PCR negative, VBG with pH 7.46 and pCO2 31.  Chest x-ray showing pulmonary edema. Patient was given IV Lasix 40 mg and started on nitroglycerin drip.  TRH called to admit.  Patient states she has been having shortness of breath and cough for the past few days but her shortness of breath became much worse tonight when she was walking to the bathroom and she also had some left-sided sharp chest pain which lasted for few seconds and resolved.  Denies any chest pain/pressure/discomfort at present.  She is endorsing orthopnea and bilateral lower extremity edema.  She reports improvement of her dyspnea after being placed on BiPAP.  She reports compliance with her home medications and states her blood pressure is always high in the 200s.  She does not have a PCP and last saw her cardiologist in August 2023 and reports missing follow-up appointments.  Review of Systems:  Review of Systems  All other systems reviewed and are negative.   Past Medical History:  Diagnosis Date   Anemia    Ectopic pregnancy    L adnexa   Hx of varicella    Hypertension    Sleep apnea     Past Surgical History:   Procedure Laterality Date   ARM SURGERY     CESAREAN SECTION N/A 06/29/2016   Procedure: CESAREAN SECTION;  Surgeon: Maxie Better, MD;  Location: [facility];  Service: Obstetrics;  Laterality: N/A;   RIGHT/LEFT HEART CATH AND CORONARY ANGIOGRAPHY N/A 11/30/2021   Procedure: RIGHT/LEFT HEART CATH AND CORONARY ANGIOGRAPHY;  Surgeon: Laurey Morale, MD;  Location: [facility] INVASIVE CV LAB;  Service: Cardiovascular;  Laterality: N/A;     reports that she has never smoked. She has never used smokeless tobacco. She reports that she does not drink alcohol and does not use drugs.  Allergies  Allergen Reactions   Shellfish Allergy Anaphylaxis    Family History  Problem Relation Age of Onset   Cancer Mother    Heart disease Mother    Heart disease Father    Hypertension Father    Stroke Father    Cancer Maternal Aunt    Diabetes Maternal Grandmother     Prior to Admission medications   Medication Sig Start Date End Date Taking? Authorizing Provider  atorvastatin (LIPITOR) 80 MG tablet Take 1 tablet (80 mg total) by mouth daily. 04/03/22  Yes Laurey Morale, MD  carvedilol (COREG) 25 MG tablet Take 1 tablet (25 mg total) by mouth 2 (two) times daily with a meal. 04/17/22  Yes Laurey Morale, MD  dapagliflozin propanediol (FARXIGA) 10 MG TABS tablet Take 1 tablet (10 mg total) by mouth daily before breakfast. 12/07/21  Yes Milford, Anderson Malta, FNP  ferrous sulfate 325 (65 FE) MG  tablet Take 1 tablet (325 mg total) by mouth every other day. 12/04/21  Yes Levin Erp, MD  isosorbide-hydrALAZINE (BIDIL) 20-37.5 MG tablet Take 1 tablet by mouth 3 (three) times daily. 06/25/22  Yes Milford, Anderson Malta, FNP  levalbuterol [facility] HFA) 45 MCG/ACT inhaler Inhale 1-2 puffs into the lungs every 6 (six) hours as needed for wheezing. 08/19/23  Yes Tanda Rockers A, DO  Multiple Vitamin (MV-ONE PO) Take 1 capsule by mouth every morning.   Yes [provider]  sacubitril-valsartan (ENTRESTO)  97-103 MG Take 1 tablet by mouth 2 (two) times daily. NEEDS FOLLOW UP APPOINTMENT FOR ANYMORE REFILLS. 02/05/23  Yes Milford, Anderson Malta, FNP  spironolactone (ALDACTONE) 25 MG tablet Take 1 tablet (25 mg total) by mouth daily. 12/07/21  Yes Milford, Anderson Malta, FNP  amLODipine (NORVASC) 10 MG tablet Take 1 tablet (10 mg total) by mouth daily. 04/17/22   Laurey Morale, MD  doxycycline (VIBRAMYCIN) 100 MG capsule Take 1 capsule (100 mg total) by mouth 2 (two) times daily. 01/09/23   Jeannie Fend, PA-C  guaiFENesin-codeine 100-10 MG/5ML syrup Take 5 mLs by mouth 3 (three) times daily as needed for cough. 08/19/23   Tanda Rockers A, DO  potassium chloride SA (KLOR-CON M) 20 MEQ tablet Take 1 tablet (20 mEq total) by mouth daily. Take 40 meq x 1 dose today and 20 meq daily thereafter. 06/26/22   Jacklynn Ganong, FNP    Physical Exam: Vitals:   08/24/23 2300 08/24/23 2305 08/24/23 2333 08/24/23 2345  BP: (!) 186/105 (!) 178/162  (!) 199/123  Pulse: (!) 105 (!) 107 (!) 103 (!) 101  Resp: (!) 41 (!) 29 (!) 26 (!) 22  Temp:      TempSrc:      SpO2: 100% 97% 100% 99%    Physical Exam Vitals reviewed.  Constitutional:      General: She is not in acute distress. HENT:     Head: Normocephalic and atraumatic.  Eyes:     Extraocular Movements: Extraocular movements intact.  Cardiovascular:     Rate and Rhythm: Normal rate and regular rhythm.     Pulses: Normal pulses.  Pulmonary:     Effort: Pulmonary effort is normal. No respiratory distress.     Breath sounds: Rales present. No wheezing.  Abdominal:     General: Bowel sounds are normal.     Palpations: Abdomen is soft.     Tenderness: There is no abdominal tenderness. There is no guarding.  Musculoskeletal:     Cervical back: Normal range of motion.     Right lower leg: Edema present.     Left lower leg: Edema present.     Comments: 3+ pitting edema of bilateral lower extremities  Skin:    General: Skin is warm and dry.  Neurological:      General: No focal deficit present.     Mental Status: She is alert and oriented to person, place, and time.     Labs on Admission: I have personally reviewed following labs and imaging studies  CBC: Recent Labs  Lab 08/24/23 2217 08/24/23 2224  WBC 8.3  --   NEUTROABS 3.8  --   HGB 13.1 14.6  HCT 42.5 43.0  MCV 88.7  --   PLT 329  --    Basic Metabolic Panel: Recent Labs  Lab 08/24/23 2224 08/24/23 2231  NA 137 136  K 3.8 3.7  CL  --  101  CO2  --  22  GLUCOSE  --  114*  BUN  --  17  CREATININE  --  1.08*  CALCIUM  --  8.5*   GFR: Estimated Creatinine Clearance: 61.5 mL/min (A) (by C-G formula based on SCr of 1.08 mg/dL (H)). Liver Function Tests: Recent Labs  Lab 08/24/23 2231  AST 35  ALT 36  ALKPHOS 66  BILITOT 0.7  PROT 6.1*  ALBUMIN 3.1*   No results for input(s): "LIPASE", "AMYLASE" in the last 168 hours. No results for input(s): "AMMONIA" in the last 168 hours. Coagulation Profile: No results for input(s): "INR", "PROTIME" in the last 168 hours. Cardiac Enzymes: No results for input(s): "CKTOTAL", "CKMB", "CKMBINDEX", "TROPONINI" in the last 168 hours. BNP (last 3 results) No results for input(s): "PROBNP" in the last 8760 hours. HbA1C: No results for input(s): "HGBA1C" in the last 72 hours. CBG: No results for input(s): "GLUCAP" in the last 168 hours. Lipid Profile: No results for input(s): "CHOL", "HDL", "LDLCALC", "TRIG", "CHOLHDL", "LDLDIRECT" in the last 72 hours. Thyroid Function Tests: No results for input(s): "TSH", "T4TOTAL", "FREET4", "T3FREE", "THYROIDAB" in the last 72 hours. Anemia Panel: No results for input(s): "VITAMINB12", "FOLATE", "FERRITIN", "TIBC", "IRON", "RETICCTPCT" in the last 72 hours. Urine analysis:    Component Value Date/Time   COLORURINE YELLOW 11/29/2021 0715   APPEARANCEUR CLEAR 11/29/2021 0715   LABSPEC 1.015 11/29/2021 0715   PHURINE 8.0 11/29/2021 0715   GLUCOSEU NEGATIVE 11/29/2021 0715   HGBUR  NEGATIVE 11/29/2021 0715   BILIRUBINUR NEGATIVE 11/29/2021 0715   KETONESUR NEGATIVE 11/29/2021 0715   PROTEINUR NEGATIVE 11/29/2021 0715   UROBILINOGEN 0.2 02/17/2014 2212   NITRITE NEGATIVE 11/29/2021 0715   LEUKOCYTESUR NEGATIVE 11/29/2021 0715    Radiological Exams on Admission: DG Chest Port 1 View  Result Date: 08/24/2023 CLINICAL DATA:  Shortness of breath EXAM: PORTABLE CHEST 1 VIEW COMPARISON:  08/18/2023 FINDINGS: Cardiomegaly with vascular congestion and increased interstitial opacities suspicious for edema. No pleural effusion. No consolidation or pneumothorax. IMPRESSION: Cardiomegaly with vascular congestion and increased interstitial opacities suspicious for edema. Electronically Signed   By: Jasmine Pang M.D.   On: 08/24/2023 22:32    EKG: Independently reviewed.  Sinus tachycardia and no obvious STEMI although interpretation is limited secondary to significant baseline wander.  Repeat EKG ordered and currently pending.  Assessment and Plan  Acute on chronic HFpEF Hypertensive emergency Acute hypoxemic respiratory failure Patient presenting with shortness of breath, orthopnea, and bilateral lower extremity edema.  Oxygen saturation in the low 80s.  Tachycardic and tachypneic on arrival to the ED and placed on BiPAP due to respiratory distress.  Work of breathing has improved with BiPAP and currently stable.  Blood pressure elevated to 220/150 and was started on nitroglycerin drip.  Currently on maximum dose of nitroglycerin drip and blood pressure remains elevated with systolic in the 190s and diastolic in the 120s.  BNP 2536 and chest x-ray showing pulmonary edema.  Patient was given IV Lasix 40 mg x 1 in the ED.  Cardiac cath done in February 2023 showing no significant coronary disease, nonischemic cardiomyopathy.  Last echo done in March 2023 showing EF 55 to 60%, grade 1 diastolic dysfunction, normal RV systolic function, and mild mitral valve regurgitation.  Suspect  noncompliance is contributing.  She does not have a PCP and has not seen her cardiologist in over a year. -Will give additional IV Lasix 40 mg x 1 tonight and then continue diuresis with IV Lasix 40 mg twice daily starting in the morning. -Continue nitroglycerin  drip -Continue BiPAP, wean as tolerated -Repeat echocardiogram ordered -Continuous pulse ox -Monitor intake and output -Daily weights -Monitor renal function -Keep n.p.o. at this time as she is on BiPAP but once weaned off, then order low-sodium diet with fluid restriction. -Resume home meds when no longer n.p.o./off BiPAP and after pharmacy med rec is done.  Elevated troponin Likely due to demand ischemia.  Troponin 42.  EKG without obvious STEMI although interpretation is limited secondary to significant baseline wander.  Chest pain appears atypical.  She had a brief episode of left-sided sharp chest pain at home which resolved in a few seconds.  Denies any chest pain/pressure/discomfort at this time.  Stat repeat EKG ordered and continue to trend troponin.  DVT prophylaxis: Lovenox Code Status: Full Code (discussed with the patient) Family Communication: Husband at bedside. Level of care: Progressive Care Unit Admission status: It is my clinical opinion that admission to INPATIENT is reasonable and necessary because of the expectation that this patient will require hospital care that crosses at least 2 midnights to treat this condition based on the medical complexity of the problems presented.  Given the aforementioned information, the predictability of an adverse outcome is felt to be significant.  John Giovanni MD [facility]  If 7PM-7AM, please contact night-coverage [email or web address]  08/24/2023, 11:56 PM

## 2023-08-24 NOTE — ED Provider Notes (Incomplete)
[place] EMERGENCY DEPARTMENT AT [facility] Provider Note   CSN: [id number] Arrival date & time: 08/24/23  2205     History {Add pertinent medical, surgical, social history, OB history to HPI:1} Chief Complaint  Patient presents with  . Shortness of Breath    Alice Hays is a 42 y.o. female.  42 year old female brought in by EMS for [facility]. Reports onset a few weeks ago, went to [place] last week and diagnosed with bronchitis, provided with albuterol and cough medicine. Patient with worsening SHOB today, called EMS who gave steroids and a neb with some improvement. Arrives in the ER on [address] [place], speaking in short sentences with audible wheezing, states CP earlier today, none now. History of heart failure, reports med compliance but states weights have been up, feels like her legs are swollen all the way up to her stomach. Non smoker, no history of COPD/asthma. Denies fevers, chills, travel, sick contacts.        Home Medications Prior to Admission medications   Medication Sig Start Date End Date Taking? Authorizing Provider  amLODipine (NORVASC) 10 MG tablet Take 1 tablet (10 mg total) by mouth daily. 04/17/22   Laurey Morale, MD  atorvastatin (LIPITOR) 80 MG tablet Take 1 tablet (80 mg total) by mouth daily. 04/03/22   Laurey Morale, MD  carvedilol (COREG) 25 MG tablet Take 1 tablet (25 mg total) by mouth 2 (two) times daily with a meal. 04/17/22   Laurey Morale, MD  dapagliflozin propanediol (FARXIGA) 10 MG TABS tablet Take 1 tablet (10 mg total) by mouth daily before breakfast. 12/07/21   Milford, Anderson Malta, FNP  doxycycline (VIBRAMYCIN) 100 MG capsule Take 1 capsule (100 mg total) by mouth 2 (two) times daily. 01/09/23   Jeannie Fend, PA-C  ferrous sulfate 325 (65 FE) MG tablet Take 1 tablet (325 mg total) by mouth every other day. 12/04/21   Levin Erp, MD  guaiFENesin-codeine 100-10 MG/5ML syrup Take 5 mLs by mouth 3 (three) times daily as needed for  cough. 08/19/23   Sloan Leiter, DO  isosorbide-hydrALAZINE (BIDIL) 20-37.5 MG tablet Take 1 tablet by mouth 3 (three) times daily. 06/25/22   Milford, Anderson Malta, FNP  levalbuterol [facility] HFA) 45 MCG/ACT inhaler Inhale 1-2 puffs into the lungs every 6 (six) hours as needed for wheezing. 08/19/23   Sloan Leiter, DO  Multiple Vitamin (MV-ONE PO) Take 1 capsule by mouth every morning.    [provider]  potassium chloride SA (KLOR-CON M) 20 MEQ tablet Take 1 tablet (20 mEq total) by mouth daily. Take 40 meq x 1 dose today and 20 meq daily thereafter. 06/26/22   Milford, Anderson Malta, FNP  sacubitril-valsartan (ENTRESTO) 97-103 MG Take 1 tablet by mouth 2 (two) times daily. NEEDS FOLLOW UP APPOINTMENT FOR ANYMORE REFILLS. 02/05/23   Jacklynn Ganong, FNP  spironolactone (ALDACTONE) 25 MG tablet Take 1 tablet (25 mg total) by mouth daily. 12/07/21   Jacklynn Ganong, FNP      Allergies    Shellfish allergy    Review of Systems   Review of Systems Negative except as per HPI Physical Exam Updated Vital Signs BP (!) 178/162   Pulse (!) 107   Temp 98.5 F (36.9 C) (Oral)   Resp (!) 29   LMP 08/10/2023 (Exact Date)   SpO2 97%  Physical Exam Vitals and nursing note reviewed.  Constitutional:      General: She is in acute  distress.     Appearance: She is well-developed. She is not diaphoretic.  HENT:     Head: Normocephalic and atraumatic.  Cardiovascular:     Rate and Rhythm: Regular rhythm. Tachycardia present.  Pulmonary:     Effort: Tachypnea present.     Breath sounds: Decreased breath sounds and rales present.  Abdominal:     Tenderness: There is no abdominal tenderness.  Musculoskeletal:     Right lower leg: Edema present.     Left lower leg: Edema present.  Skin:    General: Skin is warm and dry.  Neurological:     Mental Status: She is alert and oriented to person, place, and time.  Psychiatric:        Behavior: Behavior normal.     ED Results / Procedures /  Treatments   Labs (all labs ordered are listed, but only abnormal results are displayed) Labs Reviewed  CBC WITH DIFFERENTIAL/PLATELET - Abnormal; Notable for the following components:      Result Value   RDW 16.8 (*)    All other components within normal limits  COMPREHENSIVE METABOLIC PANEL - Abnormal; Notable for the following components:   Glucose, Bld 114 (*)    Creatinine, Ser 1.08 (*)    Calcium 8.5 (*)    Total Protein 6.1 (*)    Albumin 3.1 (*)    All other components within normal limits  BRAIN NATRIURETIC PEPTIDE - Abnormal; Notable for the following components:   B Natriuretic Peptide 2,536.7 (*)    All other components within normal limits  I-STAT VENOUS BLOOD GAS, ED - Abnormal; Notable for the following components:   pH, Ven 7.460 (*)    pCO2, Ven 31.3 (*)    pO2, Ven 97 (*)    Calcium, Ion 1.02 (*)    All other components within normal limits  TROPONIN I (HIGH SENSITIVITY) - Abnormal; Notable for the following components:   Troponin I (High Sensitivity) 42 (*)    All other components within normal limits  RESP PANEL BY RT-PCR (RSV, FLU A&B, COVID)  [id number]    EKG EKG Interpretation Date/Time:  Sunday August 24 2023 22:13:09 EDT Ventricular Rate:  111 PR Interval:  149 QRS Duration:  93 QT Interval:  317 QTC Calculation: 431 R Axis:   -24  Text Interpretation: Sinus tachycardia Left atrial enlargement Probable left ventricular hypertrophy Baseline wander in lead(s) V1 V3 V5 V6 No significant change since last tracing Confirmed by Richardean Canal [phone]) on 08/24/2023 11:20:29 PM  Radiology DG Chest Port 1 View  Result Date: 08/24/2023 CLINICAL DATA:  Shortness of breath EXAM: PORTABLE CHEST 1 VIEW COMPARISON:  08/18/2023 FINDINGS: Cardiomegaly with vascular congestion and increased interstitial opacities suspicious for edema. No pleural effusion. No consolidation or pneumothorax. IMPRESSION: Cardiomegaly with vascular congestion and increased interstitial  opacities suspicious for edema. Electronically Signed   By: Jasmine Pang M.D.   On: 08/24/2023 22:32    Procedures Procedures  {Document cardiac monitor, telemetry assessment procedure when appropriate:1}  Medications Ordered in ED Medications  nitroGLYCERIN 50 mg in dextrose 5 % 250 mL (0.2 mg/mL) infusion (20 mcg/min Intravenous Rate/Dose Change 08/24/23 2324)  furosemide (LASIX) injection 40 mg (40 mg Intravenous Given 08/24/23 2258)    ED Course/ Medical Decision Making/ A&P   {   Click here for ABCD2, HEART and other calculatorsREFRESH Note before signing :1}  Medical Decision Making Amount and/or Complexity of Data Reviewed Labs: ordered. Radiology: ordered.  Risk Prescription drug management.   This patient presents to the ED for concern of [facility], this involves an extensive number of treatment options, and is a complaint that carries with it a high risk of complications and morbidity.  The differential diagnosis includes but not limited to CHF, ACS, hypertensive emergency, PE, PNA, COVID   Co morbidities that complicate the patient evaluation  HTN, CHF, HLD   Additional history obtained:  Additional history obtained from EMS who contributes to history as above, provided with Solumedrol 125mg  and duoneb External records from outside source obtained and reviewed including echo dated 01/17/22 with EF 55-60%   Lab Tests:  I Ordered, and personally interpreted labs.  The pertinent results include:  ***   Imaging Studies ordered:  I ordered imaging studies including CXR  I independently visualized and interpreted imaging which showed cardiomegaly, vascular congestion  I agree with the radiologist interpretation   Cardiac Monitoring: / EKG:  The patient was maintained on a cardiac monitor.  I personally viewed and interpreted the cardiac monitored which showed an underlying rhythm of: sinus tach, rate 111   Consultations  Obtained:  I requested consultation with the ER attending, Dr. Silverio Lay,  and discussed lab and imaging findings as well as pertinent plan - they recommend: bipap, labs and med orders provided.  Case discussed with Dr. Loney Loh with [facility] service who will consult for admission.   Problem List / ED Course / Critical interventions / Medication management  *** I ordered medication including nitroglycerin drip, lasix, bipap  for ***  Reevaluation of the patient after these medicines showed that the patient improved I have reviewed the patients home medicines and have made adjustments as needed   Social Determinants of Health:  Lives with husband   Test / Admission - Considered:  admit   {Document critical care time when appropriate:1} {Document review of labs and clinical decision tools ie heart score, Chads2Vasc2 etc:1}  {Document your independent review of radiology images, and any outside records:1} {Document your discussion with family members, caretakers, and with consultants:1} {Document social determinants of health affecting pt's care:1} {Document your decision making why or why not admission, treatments were needed:1} Final Clinical Impression(s) / ED Diagnoses Final diagnoses:  Hypertensive urgency  Acute on chronic heart failure, unspecified heart failure type (HCC)    Rx / DC Orders ED Discharge Orders     None

## 2023-08-24 NOTE — H&P (Incomplete)
History and Physical    Alice Hays BMW:[id number] DOB: February 13, 1981 DOA: 08/24/2023  PCP: Pcp, No  Patient coming from: Home  Chief Complaint: Shortness of breath  HPI: Alice Hays is a 42 y.o. female with medical history significant of chronic systolic CHF, hypertension, history of multiple miscarriages/spontaneous abortions, anterior mediastinal mass, OSA on CPAP, hyperlipidemia presented to the ED with shortness of breath.  She was given Solu-Medrol 125 mg and albuterol/Atrovent nebs by EMS prior to arrival.  No history of asthma/COPD.  Hypoxic to the low 80s.  Tachycardic and tachypneic.  Placed on BiPAP on arrival to the ED due to respiratory distress.  Blood pressure elevated to 220/150.  Labs significant for BNP 2536, troponin 42, COVID/influenza/RSV PCR negative, VBG with pH 7.46 and pCO2 31.  Chest x-ray showing pulmonary edema.  Patient was given IV Lasix 40 mg and started on nitroglycerin drip.  TRH called to admit.  ED Course: ***  Review of Systems:  ROS  Past Medical History:  Diagnosis Date  . Anemia   . Ectopic pregnancy    L adnexa  . Hx of varicella   . Hypertension   . Sleep apnea     Past Surgical History:  Procedure Laterality Date  . ARM SURGERY    . CESAREAN SECTION N/A 06/29/2016   Procedure: CESAREAN SECTION;  Surgeon: Maxie Better, MD;  Location: [facility];  Service: Obstetrics;  Laterality: N/A;  . RIGHT/LEFT HEART CATH AND CORONARY ANGIOGRAPHY N/A 11/30/2021   Procedure: RIGHT/LEFT HEART CATH AND CORONARY ANGIOGRAPHY;  Surgeon: Laurey Morale, MD;  Location: [facility] INVASIVE CV LAB;  Service: Cardiovascular;  Laterality: N/A;     reports that she has never smoked. She has never used smokeless tobacco. She reports that she does not drink alcohol and does not use drugs.  Allergies  Allergen Reactions  . Shellfish Allergy Anaphylaxis    Family History  Problem Relation Age of Onset  . Cancer Mother   . Heart disease Mother    . Heart disease Father   . Hypertension Father   . Stroke Father   . Cancer Maternal Aunt   . Diabetes Maternal Grandmother     Prior to Admission medications   Medication Sig Start Date End Date Taking? Authorizing Provider  atorvastatin (LIPITOR) 80 MG tablet Take 1 tablet (80 mg total) by mouth daily. 04/03/22  Yes Laurey Morale, MD  carvedilol (COREG) 25 MG tablet Take 1 tablet (25 mg total) by mouth 2 (two) times daily with a meal. 04/17/22  Yes Laurey Morale, MD  dapagliflozin propanediol (FARXIGA) 10 MG TABS tablet Take 1 tablet (10 mg total) by mouth daily before breakfast. 12/07/21  Yes Milford, Anderson Malta, FNP  ferrous sulfate 325 (65 FE) MG tablet Take 1 tablet (325 mg total) by mouth every other day. 12/04/21  Yes Levin Erp, MD  isosorbide-hydrALAZINE (BIDIL) 20-37.5 MG tablet Take 1 tablet by mouth 3 (three) times daily. 06/25/22  Yes Milford, Anderson Malta, FNP  levalbuterol [facility] HFA) 45 MCG/ACT inhaler Inhale 1-2 puffs into the lungs every 6 (six) hours as needed for wheezing. 08/19/23  Yes Tanda Rockers A, DO  Multiple Vitamin (MV-ONE PO) Take 1 capsule by mouth every morning.   Yes [provider]  sacubitril-valsartan (ENTRESTO) 97-103 MG Take 1 tablet by mouth 2 (two) times daily. NEEDS FOLLOW UP APPOINTMENT FOR ANYMORE REFILLS. 02/05/23  Yes Milford, Anderson Malta, FNP  spironolactone (ALDACTONE) 25 MG tablet Take 1 tablet (25 mg  total) by mouth daily. 12/07/21  Yes Milford, Anderson Malta, FNP  amLODipine (NORVASC) 10 MG tablet Take 1 tablet (10 mg total) by mouth daily. 04/17/22   Laurey Morale, MD  doxycycline (VIBRAMYCIN) 100 MG capsule Take 1 capsule (100 mg total) by mouth 2 (two) times daily. 01/09/23   Jeannie Fend, PA-C  guaiFENesin-codeine 100-10 MG/5ML syrup Take 5 mLs by mouth 3 (three) times daily as needed for cough. 08/19/23   Tanda Rockers A, DO  potassium chloride SA (KLOR-CON M) 20 MEQ tablet Take 1 tablet (20 mEq total) by mouth daily. Take 40 meq x  1 dose today and 20 meq daily thereafter. 06/26/22   Jacklynn Ganong, FNP    Physical Exam: Vitals:   08/24/23 2300 08/24/23 2305 08/24/23 2333 08/24/23 2345  BP: (!) 186/105 (!) 178/162  (!) 199/123  Pulse: (!) 105 (!) 107 (!) 103 (!) 101  Resp: (!) 41 (!) 29 (!) 26 (!) 22  Temp:      TempSrc:      SpO2: 100% 97% 100% 99%    Physical Exam  Labs on Admission: I have personally reviewed following labs and imaging studies  CBC: Recent Labs  Lab 08/24/23 2217 08/24/23 2224  WBC 8.3  --   NEUTROABS 3.8  --   HGB 13.1 14.6  HCT 42.5 43.0  MCV 88.7  --   PLT 329  --    Basic Metabolic Panel: Recent Labs  Lab 08/24/23 2224 08/24/23 2231  NA 137 136  K 3.8 3.7  CL  --  101  CO2  --  22  GLUCOSE  --  114*  BUN  --  17  CREATININE  --  1.08*  CALCIUM  --  8.5*   GFR: Estimated Creatinine Clearance: 61.5 mL/min (A) (by C-G formula based on SCr of 1.08 mg/dL (H)). Liver Function Tests: Recent Labs  Lab 08/24/23 2231  AST 35  ALT 36  ALKPHOS 66  BILITOT 0.7  PROT 6.1*  ALBUMIN 3.1*   No results for input(s): "LIPASE", "AMYLASE" in the last 168 hours. No results for input(s): "AMMONIA" in the last 168 hours. Coagulation Profile: No results for input(s): "INR", "PROTIME" in the last 168 hours. Cardiac Enzymes: No results for input(s): "CKTOTAL", "CKMB", "CKMBINDEX", "TROPONINI" in the last 168 hours. BNP (last 3 results) No results for input(s): "PROBNP" in the last 8760 hours. HbA1C: No results for input(s): "HGBA1C" in the last 72 hours. CBG: No results for input(s): "GLUCAP" in the last 168 hours. Lipid Profile: No results for input(s): "CHOL", "HDL", "LDLCALC", "TRIG", "CHOLHDL", "LDLDIRECT" in the last 72 hours. Thyroid Function Tests: No results for input(s): "TSH", "T4TOTAL", "FREET4", "T3FREE", "THYROIDAB" in the last 72 hours. Anemia Panel: No results for input(s): "VITAMINB12", "FOLATE", "FERRITIN", "TIBC", "IRON", "RETICCTPCT" in the last 72  hours. Urine analysis:    Component Value Date/Time   COLORURINE YELLOW 11/29/2021 0715   APPEARANCEUR CLEAR 11/29/2021 0715   LABSPEC 1.015 11/29/2021 0715   PHURINE 8.0 11/29/2021 0715   GLUCOSEU NEGATIVE 11/29/2021 0715   HGBUR NEGATIVE 11/29/2021 0715   BILIRUBINUR NEGATIVE 11/29/2021 0715   KETONESUR NEGATIVE 11/29/2021 0715   PROTEINUR NEGATIVE 11/29/2021 0715   UROBILINOGEN 0.2 02/17/2014 2212   NITRITE NEGATIVE 11/29/2021 0715   LEUKOCYTESUR NEGATIVE 11/29/2021 0715    Radiological Exams on Admission: DG Chest Port 1 View  Result Date: 08/24/2023 CLINICAL DATA:  Shortness of breath EXAM: PORTABLE CHEST 1 VIEW COMPARISON:  08/18/2023 FINDINGS: Cardiomegaly with vascular  congestion and increased interstitial opacities suspicious for edema. No pleural effusion. No consolidation or pneumothorax. IMPRESSION: Cardiomegaly with vascular congestion and increased interstitial opacities suspicious for edema. Electronically Signed   By: Jasmine Pang M.D.   On: 08/24/2023 22:32    EKG: Independently reviewed. ***  Assessment and Plan   Last echo done in March 2023 was showing improvement of EF  DVT prophylaxis: {Blank single:19197::"Lovenox","SQ Heparin","IV heparin gtt","Xarelto","Eliquis","Coumadin","SCDs","***"} Code Status: {Blank single:19197::"Full Code","DNR","DNR/DNI","Comfort Care","***"} Family Communication: ***  Consults called: ***  Level of care: {Blank single:19197::"Med-Surg","Telemetry bed","Progressive Care Unit","Step Down Unit"} Admission status: *** Time Spent: 75+ minutes***  John Giovanni MD [facility]  If 7PM-7AM, please contact night-coverage [email or web address]  08/24/2023, 11:56 PM

## 2023-08-24 NOTE — ED Provider Notes (Signed)
[place] EMERGENCY DEPARTMENT AT [facility] Provider Note   CSN: [id number] Arrival date & time: 08/24/23  2205     History {Add pertinent medical, surgical, social history, OB history to HPI:1} Chief Complaint  Patient presents with   Shortness of Breath    Alice Hays is a 42 y.o. female.  42 year old female brought in by EMS for [facility]. Reports onset a few weeks ago, went to [place] last week and diagnosed with bronchitis, provided with albuterol and cough medicine. Patient with worsening SHOB today, called EMS who gave steroids and a neb with some improvement. Arrives in the ER on 3L [place], speaking in short sentences with audible wheezing, states CP earlier today, none now. History of heart failure, reports med compliance but states weights have been up, feels like her legs are swollen all the way up to her stomach. Non smoker, no history of COPD/asthma. Denies fevers, chills, travel, sick contacts.        Home Medications Prior to Admission medications   Medication Sig Start Date End Date Taking? Authorizing Provider  amLODipine (NORVASC) 10 MG tablet Take 1 tablet (10 mg total) by mouth daily. 04/17/22   Laurey Morale, MD  atorvastatin (LIPITOR) 80 MG tablet Take 1 tablet (80 mg total) by mouth daily. 04/03/22   Laurey Morale, MD  carvedilol (COREG) 25 MG tablet Take 1 tablet (25 mg total) by mouth 2 (two) times daily with a meal. 04/17/22   Laurey Morale, MD  dapagliflozin propanediol (FARXIGA) 10 MG TABS tablet Take 1 tablet (10 mg total) by mouth daily before breakfast. 12/07/21   Milford, Anderson Malta, FNP  doxycycline (VIBRAMYCIN) 100 MG capsule Take 1 capsule (100 mg total) by mouth 2 (two) times daily. 01/09/23   Jeannie Fend, PA-C  ferrous sulfate 325 (65 FE) MG tablet Take 1 tablet (325 mg total) by mouth every other day. 12/04/21   Levin Erp, MD  guaiFENesin-codeine 100-10 MG/5ML syrup Take 5 mLs by mouth 3 (three) times daily as needed for cough.  08/19/23   Sloan Leiter, DO  isosorbide-hydrALAZINE (BIDIL) 20-37.5 MG tablet Take 1 tablet by mouth 3 (three) times daily. 06/25/22   Milford, Anderson Malta, FNP  levalbuterol [facility] HFA) 45 MCG/ACT inhaler Inhale 1-2 puffs into the lungs every 6 (six) hours as needed for wheezing. 08/19/23   Sloan Leiter, DO  Multiple Vitamin (MV-ONE PO) Take 1 capsule by mouth every morning.    [provider]  potassium chloride SA (KLOR-CON M) 20 MEQ tablet Take 1 tablet (20 mEq total) by mouth daily. Take 40 meq x 1 dose today and 20 meq daily thereafter. 06/26/22   Milford, Anderson Malta, FNP  sacubitril-valsartan (ENTRESTO) 97-103 MG Take 1 tablet by mouth 2 (two) times daily. NEEDS FOLLOW UP APPOINTMENT FOR ANYMORE REFILLS. 02/05/23   Jacklynn Ganong, FNP  spironolactone (ALDACTONE) 25 MG tablet Take 1 tablet (25 mg total) by mouth daily. 12/07/21   Jacklynn Ganong, FNP      Allergies    Shellfish allergy    Review of Systems   Review of Systems Negative except as per HPI Physical Exam Updated Vital Signs Temp 98.5 F (36.9 C) (Oral)   LMP 08/10/2023 (Exact Date)  Physical Exam Vitals and nursing note reviewed.  Constitutional:      General: She is in acute distress.     Appearance: She is well-developed. She is not diaphoretic.  HENT:  Head: Normocephalic and atraumatic.  Cardiovascular:     Rate and Rhythm: Regular rhythm. Tachycardia present.  Pulmonary:     Effort: Tachypnea present.     Breath sounds: Decreased breath sounds and rales present.  Abdominal:     Tenderness: There is no abdominal tenderness.  Musculoskeletal:     Right lower leg: Edema present.     Left lower leg: Edema present.  Skin:    General: Skin is warm and dry.  Neurological:     Mental Status: She is alert and oriented to person, place, and time.  Psychiatric:        Behavior: Behavior normal.     ED Results / Procedures / Treatments   Labs (all labs ordered are listed, but only abnormal  results are displayed) Labs Reviewed  I-STAT VENOUS BLOOD GAS, ED - Abnormal; Notable for the following components:      Result Value   pH, Ven 7.460 (*)    pCO2, Ven 31.3 (*)    pO2, Ven 97 (*)    Calcium, Ion 1.02 (*)    All other components within normal limits  CBC WITH DIFFERENTIAL/PLATELET  COMPREHENSIVE METABOLIC PANEL    EKG None  Radiology No results found.  Procedures Procedures  {Document cardiac monitor, telemetry assessment procedure when appropriate:1}  Medications Ordered in ED Medications - No data to display  ED Course/ Medical Decision Making/ A&P   {   Click here for ABCD2, HEART and other calculatorsREFRESH Note before signing :1}                              Medical Decision Making  This patient presents to the ED for concern of [facility], this involves an extensive number of treatment options, and is a complaint that carries with it a high risk of complications and morbidity.  The differential diagnosis includes but not limited to CHF, ACS, hypertensive emergency, PE, PNA, COVID   Co morbidities that complicate the patient evaluation  HTN, CHF, HLD   Additional history obtained:  Additional history obtained from EMS who contributes to history as above, provided with Solumedrol 125mg  and duoneb External records from outside source obtained and reviewed including echo dated 01/17/22 with EF 55-60%   Lab Tests:  I Ordered, and personally interpreted labs.  The pertinent results include:  ***   Imaging Studies ordered:  I ordered imaging studies including CXR  I independently visualized and interpreted imaging which showed cardiomegaly, vascular congestion  I agree with the radiologist interpretation   Cardiac Monitoring: / EKG:  The patient was maintained on a cardiac monitor.  I personally viewed and interpreted the cardiac monitored which showed an underlying rhythm of: ***   Consultations Obtained:  I requested consultation with the ER  attending, Dr. Silverio Lay,  and discussed lab and imaging findings as well as pertinent plan - they recommend: bipap, labs and med orders provided.    Problem List / ED Course / Critical interventions / Medication management  *** I ordered medication including ***  for ***  Reevaluation of the patient after these medicines showed that the patient {resolved/improved/worsened:23923::"improved"} I have reviewed the patients home medicines and have made adjustments as needed   Social Determinants of Health:  ***   Test / Admission - Considered:  ***   {Document critical care time when appropriate:1} {Document review of labs and clinical decision tools ie heart score, Chads2Vasc2 etc:1}  {Document your independent  review of radiology images, and any outside records:1} {Document your discussion with family members, caretakers, and with consultants:1} {Document social determinants of health affecting pt's care:1} {Document your decision making why or why not admission, treatments were needed:1} Final Clinical Impression(s) / ED Diagnoses Final diagnoses:  None    Rx / DC Orders ED Discharge Orders     None

## 2023-08-24 NOTE — ED Triage Notes (Signed)
Patient arrived with EMS from home reports SOB with chest congestion/productive cough this evening , diagnosed this week with Bronchitis with no Rx, history of CHF . She received Solumedrol 125 mg and Albuterol/Atrovent nebulizer prior to arrival by EMS . CBG=99.

## 2023-08-25 ENCOUNTER — Encounter (HOSPITAL_COMMUNITY): Payer: Self-pay | Admitting: Internal Medicine

## 2023-08-25 ENCOUNTER — Inpatient Hospital Stay (HOSPITAL_COMMUNITY): Payer: BC Managed Care – PPO

## 2023-08-25 DIAGNOSIS — I5033 Acute on chronic diastolic (congestive) heart failure: Secondary | ICD-10-CM | POA: Diagnosis not present

## 2023-08-25 DIAGNOSIS — I5031 Acute diastolic (congestive) heart failure: Secondary | ICD-10-CM

## 2023-08-25 DIAGNOSIS — I161 Hypertensive emergency: Secondary | ICD-10-CM

## 2023-08-25 DIAGNOSIS — R7989 Other specified abnormal findings of blood chemistry: Secondary | ICD-10-CM

## 2023-08-25 DIAGNOSIS — J9601 Acute respiratory failure with hypoxia: Secondary | ICD-10-CM

## 2023-08-25 LAB — ECHOCARDIOGRAM COMPLETE
Calc EF: 39.5 %
MV M vel: 5.57 m/s
MV Peak grad: 124.1 mm[Hg]
Radius: 0.5 cm
S' Lateral: 4.4 cm
Single Plane A2C EF: 36.3 %
Single Plane A4C EF: 41.7 %

## 2023-08-25 LAB — CBC
HCT: 37 % (ref 36.0–46.0)
Hemoglobin: 12.3 g/dL (ref 12.0–15.0)
MCH: 28.3 pg (ref 26.0–34.0)
MCHC: 33.2 g/dL (ref 30.0–36.0)
MCV: 85.3 fL (ref 80.0–100.0)
Platelets: 287 10*3/uL (ref 150–400)
RBC: 4.34 MIL/uL (ref 3.87–5.11)
RDW: 16.3 % — ABNORMAL HIGH (ref 11.5–15.5)
WBC: 8.7 10*3/uL (ref 4.0–10.5)
nRBC: 0 % (ref 0.0–0.2)

## 2023-08-25 LAB — BASIC METABOLIC PANEL
Anion gap: 14 (ref 5–15)
BUN: 16 mg/dL (ref 6–20)
CO2: 24 mmol/L (ref 22–32)
Calcium: 8.6 mg/dL — ABNORMAL LOW (ref 8.9–10.3)
Chloride: 99 mmol/L (ref 98–111)
Creatinine, Ser: 0.99 mg/dL (ref 0.44–1.00)
GFR, Estimated: >60 mL/min (ref >60)
Glucose, Bld: 173 mg/dL — ABNORMAL HIGH (ref 70–99)
Potassium: 2.9 mmol/L — ABNORMAL LOW (ref 3.5–5.1)
Sodium: 137 mmol/L (ref 135–145)

## 2023-08-25 LAB — HIV ANTIBODY (ROUTINE TESTING W REFLEX): HIV Screen 4th Generation wRfx: NONREACTIVE

## 2023-08-25 LAB — TROPONIN I (HIGH SENSITIVITY): Troponin I (High Sensitivity): 38 ng/L — ABNORMAL HIGH (ref <18)

## 2023-08-25 LAB — MAGNESIUM: Magnesium: 1.7 mg/dL (ref 1.7–2.4)

## 2023-08-25 LAB — PHOSPHORUS: Phosphorus: 3.6 mg/dL (ref 2.5–4.6)

## 2023-08-25 MED ORDER — SPIRONOLACTONE 25 MG PO TABS
25.0000 mg | ORAL_TABLET | Freq: Every day | ORAL | Status: DC
  Administered 2023-08-25 – 2023-08-27 (×3): 25 mg via ORAL
  Filled 2023-08-25 (×3): qty 1

## 2023-08-25 MED ORDER — ISOSORB DINITRATE-HYDRALAZINE 20-37.5 MG PO TABS
1.0000 | ORAL_TABLET | Freq: Three times a day (TID) | ORAL | Status: DC
Start: 2023-08-25 — End: 2023-08-27
  Administered 2023-08-25 – 2023-08-27 (×7): 1 via ORAL
  Filled 2023-08-25 (×7): qty 1

## 2023-08-25 MED ORDER — ACETAMINOPHEN 325 MG PO TABS
650.0000 mg | ORAL_TABLET | Freq: Four times a day (QID) | ORAL | Status: DC | PRN
  Administered 2023-08-25 (×2): 650 mg via ORAL
  Filled 2023-08-25 (×2): qty 2

## 2023-08-25 MED ORDER — CARVEDILOL 25 MG PO TABS
25.0000 mg | ORAL_TABLET | Freq: Two times a day (BID) | ORAL | Status: DC
  Administered 2023-08-25 – 2023-08-27 (×5): 25 mg via ORAL
  Filled 2023-08-25 (×6): qty 1

## 2023-08-25 MED ORDER — POTASSIUM CHLORIDE 20 MEQ PO PACK
40.0000 meq | PACK | ORAL | Status: AC
  Administered 2023-08-25 (×3): 40 meq via ORAL
  Filled 2023-08-25 (×3): qty 2

## 2023-08-25 MED ORDER — AMLODIPINE BESYLATE 10 MG PO TABS
10.0000 mg | ORAL_TABLET | Freq: Every day | ORAL | Status: DC
  Administered 2023-08-25 – 2023-08-27 (×3): 10 mg via ORAL
  Filled 2023-08-25 (×3): qty 1

## 2023-08-25 MED ORDER — METOPROLOL TARTRATE 5 MG/5ML IV SOLN: 5.0000 mg | INTRAVENOUS | Status: DC | PRN

## 2023-08-25 MED ORDER — FUROSEMIDE 10 MG/ML IJ SOLN
40.0000 mg | Freq: Once | INTRAMUSCULAR | Status: AC
  Administered 2023-08-25: 40 mg via INTRAVENOUS
  Filled 2023-08-25: qty 4

## 2023-08-25 MED ORDER — HYDRALAZINE HCL 20 MG/ML IJ SOLN: 10.0000 mg | INTRAMUSCULAR | Status: DC | PRN

## 2023-08-25 MED ORDER — SACUBITRIL-VALSARTAN 97-103 MG PO TABS
1.0000 | ORAL_TABLET | Freq: Two times a day (BID) | ORAL | Status: DC
Start: 2023-08-25 — End: 2023-08-28
  Administered 2023-08-25 – 2023-08-27 (×6): 1 via ORAL
  Filled 2023-08-25 (×6): qty 1

## 2023-08-25 MED ORDER — ENOXAPARIN SODIUM 40 MG/0.4ML IJ SOSY
40.0000 mg | PREFILLED_SYRINGE | INTRAMUSCULAR | Status: DC
  Administered 2023-08-25 – 2023-08-29 (×5): 40 mg via SUBCUTANEOUS
  Filled 2023-08-25 (×5): qty 0.4

## 2023-08-25 MED ORDER — LEVALBUTEROL TARTRATE 45 MCG/ACT IN AERO: 1.0000 | INHALATION_SPRAY | Freq: Four times a day (QID) | RESPIRATORY_TRACT | Status: DC | PRN

## 2023-08-25 MED ORDER — CALCIUM GLUCONATE-NACL 2-0.675 GM/100ML-% IV SOLN
2.0000 g | Freq: Once | INTRAVENOUS | Status: AC
  Administered 2023-08-25: 2000 mg via INTRAVENOUS
  Filled 2023-08-25: qty 100

## 2023-08-25 MED ORDER — GUAIFENESIN 100 MG/5ML PO LIQD: 5.0000 mL | ORAL | Status: DC | PRN

## 2023-08-25 MED ORDER — DAPAGLIFLOZIN PROPANEDIOL 10 MG PO TABS
10.0000 mg | ORAL_TABLET | Freq: Every day | ORAL | Status: DC
  Administered 2023-08-26 – 2023-08-29 (×4): 10 mg via ORAL
  Filled 2023-08-25 (×4): qty 1

## 2023-08-25 MED ORDER — TRAZODONE HCL 50 MG PO TABS: 50.0000 mg | ORAL_TABLET | Freq: Every evening | ORAL | Status: DC | PRN

## 2023-08-25 MED ORDER — IPRATROPIUM-ALBUTEROL 0.5-2.5 (3) MG/3ML IN SOLN: 3.0000 mL | RESPIRATORY_TRACT | Status: DC | PRN

## 2023-08-25 MED ORDER — ACETAMINOPHEN 650 MG RE SUPP: 650.0000 mg | Freq: Four times a day (QID) | RECTAL | Status: DC | PRN

## 2023-08-25 MED ORDER — FUROSEMIDE 10 MG/ML IJ SOLN
40.0000 mg | Freq: Two times a day (BID) | INTRAMUSCULAR | Status: DC
  Administered 2023-08-25 – 2023-08-27 (×6): 40 mg via INTRAVENOUS
  Filled 2023-08-25 (×7): qty 4

## 2023-08-25 MED ORDER — SENNOSIDES-DOCUSATE SODIUM 8.6-50 MG PO TABS: 1.0000 | ORAL_TABLET | Freq: Every evening | ORAL | Status: DC | PRN

## 2023-08-25 MED ORDER — FERROUS SULFATE 325 (65 FE) MG PO TABS
325.0000 mg | ORAL_TABLET | ORAL | Status: DC
  Administered 2023-08-25 – 2023-08-29 (×3): 325 mg via ORAL
  Filled 2023-08-25 (×3): qty 1

## 2023-08-25 MED ORDER — ATORVASTATIN CALCIUM 80 MG PO TABS
80.0000 mg | ORAL_TABLET | Freq: Every day | ORAL | Status: DC
  Administered 2023-08-25 – 2023-08-29 (×5): 80 mg via ORAL
  Filled 2023-08-25 (×5): qty 1

## 2023-08-25 NOTE — ED Notes (Signed)
Completed bed linen and gown change / repositioned on bed , adult brief applied .

## 2023-08-25 NOTE — Plan of Care (Signed)
  Problem: Education: Goal: Ability to demonstrate management of disease process will improve Outcome: Progressing   Problem: Education: Goal: Ability to verbalize understanding of medication therapies will improve Outcome: Progressing   

## 2023-08-25 NOTE — ED Notes (Signed)
Admitting MD at bedside , updated on patient's persistent hypertension , NTG drip infusing at 200 mcg /min .

## 2023-08-25 NOTE — Progress Notes (Signed)
Patient transported on the BiPAP to 3E12 with no complications. Vitals stable.

## 2023-08-25 NOTE — Progress Notes (Signed)
PROGRESS NOTE    Alice Hays  ZOX:[id number] DOB: 11-07-1980 DOA: 08/24/2023 PCP: Pcp, No   Brief Narrative:  42 y.o. female with medical history significant of CHF/nonischemic cardiomyopathy, hypertension, history of multiple miscarriages/spontaneous abortions, anterior mediastinal mass, OSA on CPAP, hyperlipidemia presented to the ED with shortness of breath.  Patient initially given Solu-Medrol and bronchodilators but symptoms persisted therefore placed on BiPAP.  Initial blood pressure was also elevated.   Assessment & Plan:  Principal Problem:   Acute on chronic heart failure with preserved ejection fraction (HFpEF) (HCC) Active Problems:   Hypertensive emergency   Acute hypoxemic respiratory failure (HCC)   Elevated troponin    Acute on chronic HFpEF, EF 60% Hypertensive emergency Acute hypoxemic respiratory failure Patient initially placed on BiPAP in the ER.  Chest x-ray showing vascular congestion with elevated BNP.  Currently on IV Lasix twice daily. We will wean off BiPAp and Nitroglycerine Drip.  Echocardiogram ordered. Monitor and replete electrolytes as needed Continue home medications including Farxiga, Coreg, BiDil, Entresto, Aldactone  Hypokalemia/hypocalcemia -Replete.     Elevated troponin Suspect demand ischemia.  Troponins are flat.  Essential hypertension - Home Norvasc (on hold for now). Cont Coreg, BiDil, Entresto, Aldactone.  IV as needed.  Hyperlipidemia - Statin  Anemia of chronic disease - Iron supplements.  History of multiple miscarriages - Workup in the past has been negative     DVT prophylaxis: enoxaparin (LOVENOX) injection 40 mg Start: 08/25/23 1000 Code Status: Full Family Communication:   Status is: Inpatient Remains inpatient appropriate because: Severe SOB, cont hosp stay    Subjective: SOB improving.  BP slowing improving.    Examination:  General exam: Appears calm and comfortable  Respiratory system: Clear  to auscultation. Respiratory effort normal. Cardiovascular system: S1 & S2 heard, RRR. No JVD, murmurs, rubs, gallops or clicks. No pedal edema. Gastrointestinal system: Abdomen is nondistended, soft and nontender. No organomegaly or masses felt. Normal bowel sounds heard. Central nervous system: Alert and oriented. No focal neurological deficits. Extremities: Symmetric 5 x 5 power. Skin: No rashes, lesions or ulcers Psychiatry: Judgement and insight appear normal. Mood & affect appropriate.      Diet Orders (From admission, onward)     Start     Ordered   08/25/23 0046  Diet NPO time specified  Diet effective now        08/25/23 0046            Objective: Vitals:   08/25/23 0348 08/25/23 0418 08/25/23 0435 08/25/23 0630  BP: (!) 154/105 (!) 154/105 (!) 148/97 131/78  Pulse: 91 92 91 84  Resp: 20 (!) 24 19 (!) 22  Temp: 99 F (37.2 C)     TempSrc: Axillary     SpO2: 99% 98% 99% 98%    Intake/Output Summary (Last 24 hours) at 08/25/2023 0752 Last data filed at 08/25/2023 0146 Gross per 24 hour  Intake --  Output 1650 ml  Net -1650 ml   There were no vitals filed for this visit.  Scheduled Meds:  enoxaparin (LOVENOX) injection  40 mg Subcutaneous Q24H   furosemide  40 mg Intravenous BID   Continuous Infusions:  nitroGLYCERIN 160 mcg/min (08/25/23 0630)    Nutritional status     There is no height or weight on file to calculate BMI.  Data Reviewed:   CBC: Recent Labs  Lab 08/24/23 2217 08/24/23 2224  WBC 8.3  --   NEUTROABS 3.8  --   HGB 13.1 14.6  HCT  42.5 43.0  MCV 88.7  --   PLT 329  --    Basic Metabolic Panel: Recent Labs  Lab 08/24/23 2224 08/24/23 2231 08/25/23 0322  NA 137 136 137  K 3.8 3.7 2.9*  CL  --  101 99  CO2  --  22 24  GLUCOSE  --  114* 173*  BUN  --  17 16  CREATININE  --  1.08* 0.99  CALCIUM  --  8.5* 8.6*   GFR: Estimated Creatinine Clearance: 67.1 mL/min (by C-G formula based on SCr of 0.99 mg/dL). Liver  Function Tests: Recent Labs  Lab 08/24/23 2231  AST 35  ALT 36  ALKPHOS 66  BILITOT 0.7  PROT 6.1*  ALBUMIN 3.1*   No results for input(s): "LIPASE", "AMYLASE" in the last 168 hours. No results for input(s): "AMMONIA" in the last 168 hours. Coagulation Profile: No results for input(s): "INR", "PROTIME" in the last 168 hours. Cardiac Enzymes: No results for input(s): "CKTOTAL", "CKMB", "CKMBINDEX", "TROPONINI" in the last 168 hours. BNP (last 3 results) No results for input(s): "PROBNP" in the last 8760 hours. HbA1C: No results for input(s): "HGBA1C" in the last 72 hours. CBG: No results for input(s): "GLUCAP" in the last 168 hours. Lipid Profile: No results for input(s): "CHOL", "HDL", "LDLCALC", "TRIG", "CHOLHDL", "LDLDIRECT" in the last 72 hours. Thyroid Function Tests: No results for input(s): "TSH", "T4TOTAL", "FREET4", "T3FREE", "THYROIDAB" in the last 72 hours. Anemia Panel: No results for input(s): "VITAMINB12", "FOLATE", "FERRITIN", "TIBC", "IRON", "RETICCTPCT" in the last 72 hours. Sepsis Labs: No results for input(s): "PROCALCITON", "LATICACIDVEN" in the last 168 hours.  Recent Results (from the past 240 hour(s))  Resp panel by RT-PCR (RSV, Flu A&B, Covid) Anterior Nasal Swab     Status: None   Collection Time: 08/18/23  8:42 PM   Specimen: Anterior Nasal Swab  Result Value Ref Range Status   SARS Coronavirus 2 by RT PCR NEGATIVE NEGATIVE Final   Influenza A by PCR NEGATIVE NEGATIVE Final   Influenza B by PCR NEGATIVE NEGATIVE Final    Comment: (NOTE) The Xpert Xpress SARS-CoV-2/FLU/RSV plus assay is intended as an aid in the diagnosis of influenza from Nasopharyngeal swab specimens and should not be used as a sole basis for treatment. Nasal washings and aspirates are unacceptable for Xpert Xpress SARS-CoV-2/FLU/RSV testing.  Fact Sheet for Patients: BloggerCourse.com  Fact Sheet for Healthcare  Providers: SeriousBroker.it  This test is not yet approved or cleared by the [place] FDA and has been authorized for detection and/or diagnosis of SARS-CoV-2 by FDA under an Emergency Use Authorization (EUA). This EUA will remain in effect (meaning this test can be used) for the duration of the COVID-19 declaration under Section 564(b)(1) of the Act, 21 U.S.C. section 360bbb-3(b)(1), unless the authorization is terminated or revoked.     Resp Syncytial Virus by PCR NEGATIVE NEGATIVE Final    Comment: (NOTE) Fact Sheet for Patients: BloggerCourse.com  Fact Sheet for Healthcare Providers: SeriousBroker.it  This test is not yet approved or cleared by the [place] FDA and has been authorized for detection and/or diagnosis of SARS-CoV-2 by FDA under an Emergency Use Authorization (EUA). This EUA will remain in effect (meaning this test can be used) for the duration of the COVID-19 declaration under Section 564(b)(1) of the Act, 21 U.S.C. section 360bbb-3(b)(1), unless the authorization is terminated or revoked.  Performed at [facility], [address], [place], [place] [address]   Resp panel by RT-PCR (RSV, Flu  A&B, Covid) Anterior Nasal Swab     Status: None   Collection Time: 08/24/23 10:32 PM   Specimen: Anterior Nasal Swab  Result Value Ref Range Status   SARS Coronavirus 2 by RT PCR NEGATIVE NEGATIVE Final   Influenza A by PCR NEGATIVE NEGATIVE Final   Influenza B by PCR NEGATIVE NEGATIVE Final    Comment: (NOTE) The Xpert Xpress SARS-CoV-2/FLU/RSV plus assay is intended as an aid in the diagnosis of influenza from Nasopharyngeal swab specimens and should not be used as a sole basis for treatment. Nasal washings and aspirates are unacceptable for Xpert Xpress SARS-CoV-2/FLU/RSV testing.  Fact Sheet for Patients: BloggerCourse.com  Fact Sheet for  Healthcare Providers: SeriousBroker.it  This test is not yet approved or cleared by the [place] FDA and has been authorized for detection and/or diagnosis of SARS-CoV-2 by FDA under an Emergency Use Authorization (EUA). This EUA will remain in effect (meaning this test can be used) for the duration of the COVID-19 declaration under Section 564(b)(1) of the Act, 21 U.S.C. section 360bbb-3(b)(1), unless the authorization is terminated or revoked.     Resp Syncytial Virus by PCR NEGATIVE NEGATIVE Final    Comment: (NOTE) Fact Sheet for Patients: BloggerCourse.com  Fact Sheet for Healthcare Providers: SeriousBroker.it  This test is not yet approved or cleared by the [place] FDA and has been authorized for detection and/or diagnosis of SARS-CoV-2 by FDA under an Emergency Use Authorization (EUA). This EUA will remain in effect (meaning this test can be used) for the duration of the COVID-19 declaration under Section 564(b)(1) of the Act, 21 U.S.C. section 360bbb-3(b)(1), unless the authorization is terminated or revoked.  Performed at [facility], [address], [place], [place] [address]          Radiology Studies: DG Chest Port 1 View  Result Date: 08/24/2023 CLINICAL DATA:  Shortness of breath EXAM: PORTABLE CHEST 1 VIEW COMPARISON:  08/18/2023 FINDINGS: Cardiomegaly with vascular congestion and increased interstitial opacities suspicious for edema. No pleural effusion. No consolidation or pneumothorax. IMPRESSION: Cardiomegaly with vascular congestion and increased interstitial opacities suspicious for edema. Electronically Signed   By: Jasmine Pang M.D.   On: 08/24/2023 22:32           LOS: 1 day  Critical care time spent : 40  minutes examining the patient, discussing with RN, consultants as needed, coordinating care and management.The medical decision making on this  patient was of high complexity, the critically ill patient is at high risk for clinical deterioration.  Critical care time was exclusive of separately billable procedures and treating other patients. Critical care was necessary to treat or prevent imminent or life-threatening deterioration. Critical care was time spent personally by me on the following activities: development of treatment plan with patient and/or surrogate as well as nursing, discussions with consultants, evaluation of patient's response to treatment, examination of patient, obtaining history from patient or surrogate, ordering and performing treatments and interventions, ordering and review of laboratory studies, ordering and review of radiographic studies, pulse oximetry and re-evaluation of patient's condition.     Miguel Rota, MD [facility]  If 7PM-7AM, please contact night-coverage  08/25/2023, 7:52 AM

## 2023-08-25 NOTE — Plan of Care (Signed)
  Problem: Education: Goal: Ability to demonstrate management of disease process will improve Outcome: Progressing Goal: Ability to verbalize understanding of medication therapies will improve Outcome: Progressing Goal: Individualized Educational Video(s) Outcome: Progressing   Problem: Activity: Goal: Capacity to carry out activities will improve Outcome: Progressing   

## 2023-08-25 NOTE — Progress Notes (Signed)
RT note. Patient taken off bipap at this time per RN request. Patient resting comfortable, sat 98% on rm air at this time. Bipap still in patients room if needed later, RT will continue to monitor.   08/25/23 0827  Therapy Vitals  Pulse Rate 89  Resp (!) 28  MEWS Score/Color  MEWS Score 2  MEWS Score Color Yellow  Oxygen Therapy/Pulse Ox  O2 Device (S)  Room Air (v60 on stand by)  SpO2 98 %

## 2023-08-25 NOTE — ED Notes (Signed)
ED TO INPATIENT HANDOFF REPORT  ED Nurse Name and Phone #:  Molly Maduro  RN [phone]  S Name/Age/Gender Alice Hays 42 y.o. female Room/Bed: TRACC/TRACC  Code Status   Code Status: Prior  Home/SNF/Other Home Patient oriented to: self, place, time, and situation Is this baseline? Yes   Triage Complete: Triage complete  Chief Complaint Acute on chronic heart failure with preserved ejection fraction (HFpEF) (HCC) [I50.33]  Triage Note Patient arrived with EMS from home reports SOB with chest congestion/productive cough this evening , diagnosed this week with Bronchitis with no Rx, history of CHF . She received Solumedrol 125 mg and Albuterol/Atrovent nebulizer prior to arrival by EMS . CBG=99.   Allergies Allergies  Allergen Reactions   Shellfish Allergy Anaphylaxis    Level of Care/Admitting Diagnosis ED Disposition     ED Disposition  Admit   Condition  --   Comment  Hospital Area: [facility] [[id number]]  Level of Care: Progressive [102]  Admit to Progressive based on following criteria: CARDIOVASCULAR & THORACIC of moderate stability with acute coronary syndrome symptoms/low risk myocardial infarction/hypertensive urgency/arrhythmias/heart failure potentially compromising stability and stable post cardiovascular intervention patients.  May admit patient to [facility] or [facility] if equivalent level of care is available:: Yes  Covid Evaluation: Asymptomatic - no recent exposure (last 10 days) testing not required  Diagnosis: Acute on chronic heart failure with preserved ejection fraction (HFpEF) [facility]) [[id number]]  Admitting Physician: John Giovanni [[id number]]  Attending Physician: John Giovanni [[id number]]  Certification:: I certify this patient will need inpatient services for at least 2 midnights  Expected Medical Readiness: 08/26/2023          B Medical/Surgery History Past Medical History:  Diagnosis Date   Anemia    Ectopic  pregnancy    L adnexa   Hx of varicella    Hypertension    Sleep apnea    Past Surgical History:  Procedure Laterality Date   ARM SURGERY     CESAREAN SECTION N/A 06/29/2016   Procedure: CESAREAN SECTION;  Surgeon: Maxie Better, MD;  Location: [facility];  Service: Obstetrics;  Laterality: N/A;   RIGHT/LEFT HEART CATH AND CORONARY ANGIOGRAPHY N/A 11/30/2021   Procedure: RIGHT/LEFT HEART CATH AND CORONARY ANGIOGRAPHY;  Surgeon: Laurey Morale, MD;  Location: [facility] INVASIVE CV LAB;  Service: Cardiovascular;  Laterality: N/A;     A IV Location/Drains/Wounds Patient Lines/Drains/Airways Status     Active Line/Drains/Airways     Name Placement date Placement time Site Days   Peripheral IV 08/24/23 18 G Left Antecubital 08/24/23  --  Antecubital  1   Peripheral IV 08/24/23 18 G Right Antecubital 08/24/23  2320  Antecubital  1   External Urinary Catheter 08/24/23  2308  --  1            Intake/Output Last 24 hours No intake or output data in the 24 hours ending 08/25/23 0013  Labs/Imaging Results for orders placed or performed during the hospital encounter of 08/24/23 (from the past 48 hour(s))  CBC with Differential     Status: Abnormal   Collection Time: 08/24/23 10:17 PM  Result Value Ref Range   WBC 8.3 4.0 - 10.5 K/uL   RBC 4.79 3.87 - 5.11 MIL/uL   Hemoglobin 13.1 12.0 - 15.0 g/dL   HCT 25.3 66.4 - 40.3 %   MCV 88.7 80.0 - 100.0 fL   MCH 27.3 26.0 - 34.0 pg   MCHC 30.8 30.0 - 36.0  g/dL   RDW 82.9 (H) 56.2 - 13.0 %   Platelets 329 150 - 400 K/uL   nRBC 0.0 0.0 - 0.2 %   Neutrophils Relative % 46 %   Neutro Abs 3.8 1.7 - 7.7 K/uL   Lymphocytes Relative 37 %   Lymphs Abs 3.1 0.7 - 4.0 K/uL   Monocytes Relative 11 %   Monocytes Absolute 0.9 0.1 - 1.0 K/uL   Eosinophils Relative 5 %   Eosinophils Absolute 0.4 0.0 - 0.5 K/uL   Basophils Relative 1 %   Basophils Absolute 0.1 0.0 - 0.1 K/uL   Immature Granulocytes 0 %   Abs Immature Granulocytes 0.03 0.00  - 0.07 K/uL    Comment: Performed at [facility], [address] [place], [place] [address]  Brain natriuretic peptide     Status: Abnormal   Collection Time: 08/24/23 10:17 PM  Result Value Ref Range   B Natriuretic Peptide 2,536.7 (H) 0.0 - 100.0 pg/mL    Comment: Performed at [facility], [address] [place], [place] [address]  I-Stat venous blood gas, [facility], [facility], [facility])     Status: Abnormal   Collection Time: 08/24/23 10:24 PM  Result Value Ref Range   pH, Ven 7.460 (H) 7.25 - 7.43   pCO2, Ven 31.3 (L) 44 - 60 mmHg   pO2, Ven 97 (H) 32 - 45 mmHg   Bicarbonate 22.2 20.0 - 28.0 mmol/L   TCO2 23 22 - 32 mmol/L   O2 Saturation 98 %   Acid-base deficit 1.0 0.0 - 2.0 mmol/L   Sodium 137 135 - 145 mmol/L   Potassium 3.8 3.5 - 5.1 mmol/L   Calcium, Ion 1.02 (L) 1.15 - 1.40 mmol/L   HCT 43.0 36.0 - 46.0 %   Hemoglobin 14.6 12.0 - 15.0 g/dL   Sample type VENOUS   Comprehensive metabolic panel     Status: Abnormal   Collection Time: 08/24/23 10:31 PM  Result Value Ref Range   Sodium 136 135 - 145 mmol/L   Potassium 3.7 3.5 - 5.1 mmol/L   Chloride 101 98 - 111 mmol/L   CO2 22 22 - 32 mmol/L   Glucose, Bld 114 (H) 70 - 99 mg/dL    Comment: Glucose reference range applies only to samples taken after fasting for at least 8 hours.   BUN 17 6 - 20 mg/dL   Creatinine, Ser 9.52 (H) 0.44 - 1.00 mg/dL   Calcium 8.5 (L) 8.9 - 10.3 mg/dL   Total Protein 6.1 (L) 6.5 - 8.1 g/dL   Albumin 3.1 (L) 3.5 - 5.0 g/dL   AST 35 15 - 41 U/L   ALT 36 0 - 44 U/L   Alkaline Phosphatase 66 38 - 126 U/L   Total Bilirubin 0.7 0.3 - 1.2 mg/dL   GFR, Estimated >84 >13 mL/min    Comment: (NOTE) Calculated using the CKD-EPI Creatinine Equation (2021)    Anion gap 13 5 - 15    Comment: Performed at [facility], [address] [place], [place] [address]  Troponin I (High Sensitivity)     Status: Abnormal   Collection Time: 08/24/23 10:31 PM  Result Value Ref Range   Troponin I (High  Sensitivity) 42 (H) <18 ng/L    Comment: (NOTE) Elevated high sensitivity troponin I (hsTnI) values and significant  changes across serial measurements may suggest ACS but many other  chronic and acute conditions are known to elevate hsTnI results.  Refer to the "Links" section for chest pain algorithms and additional  guidance. Performed at [facility], [address], [place], [place] [address]   Resp panel by RT-PCR (RSV, Flu A&B, Covid) Anterior Nasal Swab     Status: None   Collection Time: 08/24/23 10:32 PM   Specimen: Anterior Nasal Swab  Result Value Ref Range   SARS Coronavirus 2 by RT PCR NEGATIVE NEGATIVE   Influenza A by PCR NEGATIVE NEGATIVE   Influenza B by PCR NEGATIVE NEGATIVE    Comment: (NOTE) The Xpert Xpress SARS-CoV-2/FLU/RSV plus assay is intended as an aid in the diagnosis of influenza from Nasopharyngeal swab specimens and should not be used as a sole basis for treatment. Nasal washings and aspirates are unacceptable for Xpert Xpress SARS-CoV-2/FLU/RSV testing.  Fact Sheet for Patients: BloggerCourse.com  Fact Sheet for Healthcare Providers: SeriousBroker.it  This test is not yet approved or cleared by the [place] FDA and has been authorized for detection and/or diagnosis of SARS-CoV-2 by FDA under an Emergency Use Authorization (EUA). This EUA will remain in effect (meaning this test can be used) for the duration of the COVID-19 declaration under Section 564(b)(1) of the Act, 21 U.S.C. section 360bbb-3(b)(1), unless the authorization is terminated or revoked.     Resp Syncytial Virus by PCR NEGATIVE NEGATIVE    Comment: (NOTE) Fact Sheet for Patients: BloggerCourse.com  Fact Sheet for Healthcare Providers: SeriousBroker.it  This test is not yet approved or cleared by the [place] FDA and has been authorized for detection and/or  diagnosis of SARS-CoV-2 by FDA under an Emergency Use Authorization (EUA). This EUA will remain in effect (meaning this test can be used) for the duration of the COVID-19 declaration under Section 564(b)(1) of the Act, 21 U.S.C. section 360bbb-3(b)(1), unless the authorization is terminated or revoked.  Performed at [facility], [address], [place], [place] [address]    DG Chest Port 1 View  Result Date: 08/24/2023 CLINICAL DATA:  Shortness of breath EXAM: PORTABLE CHEST 1 VIEW COMPARISON:  08/18/2023 FINDINGS: Cardiomegaly with vascular congestion and increased interstitial opacities suspicious for edema. No pleural effusion. No consolidation or pneumothorax. IMPRESSION: Cardiomegaly with vascular congestion and increased interstitial opacities suspicious for edema. Electronically Signed   By: Jasmine Pang M.D.   On: 08/24/2023 22:32    Pending Labs Unresulted Labs (From admission, onward)    None       Vitals/Pain Today's Vitals   08/24/23 2355 08/25/23 0000 08/25/23 0010 08/25/23 0012  BP: (!) 183/111 (!) 184/118 (!) 177/115   Pulse: 98 97 96   Resp: (!) 26 (!) 29 (!) 27   Temp:      TempSrc:      SpO2: 99% 96% 98%   PainSc:    0-No pain    Isolation Precautions No active isolations  Medications Medications  nitroGLYCERIN 50 mg in dextrose 5 % 250 mL (0.2 mg/mL) infusion (150 mcg/min Intravenous Rate/Dose Change 08/25/23 0011)  furosemide (LASIX) injection 40 mg (40 mg Intravenous Given 08/24/23 2258)    Mobility walks     Focused Assessments     R Recommendations: See Admitting Provider Note  Report given to:   Additional Notes:

## 2023-08-25 NOTE — Consult Note (Addendum)
Cardiology Consultation   Patient ID: Alice Hays MRN: [id number]; DOB: 1981-10-14  Admit date: 08/24/2023 Date of Consult: 08/25/2023  PCP:  Oneita Hurt, No   [facility] Providers Cardiologist:  Dr Shirlee Latch    Patient Profile:   Alice Hays is a 42 y.o. female with a hx of HTN, multiple miscarriages, mediastinal mass, chronic systolic heart failure with improved LVEF 12/2021,  non-ischemic cardiomyopathy, who is being seen 08/25/2023 for the evaluation of CHF at the request of Dr Nelson Chimes.  History of Present Illness:   Alice Hays with above PMH presented to ER 08/24/23 for SOB. She had recent urgent care visit and was told she has early bronchitis and was given albuterol. No known asthma or COPD. She states she has not seen cardiology since 05/2022, because her family member got into a car accident and she has busy taking of her family member, missed a lot of her own appointments. She has been feeling SOB with exertion for several weeks, moving boxes around at work has been hard, she is much slower due to SOB and fatigue. She endorses orthopnea. She states her legs are more swelling than usual. She has not been measuring weight. She states she takes her meds daily, ran out of [place] since 12 2023, continued on coreg/entresto/spironolactone/bidil. She noted her BP 130s systolic when she takes her meds. She felt her BP is mostly in this ranges and denied any medication non-compliance. She also had some left sided chest pain that lasted seconds and resolved yesterday, prior to that, she did not have any chest pain. She states her refills had ran out for her meds since March 2024, but she had enough supply of her meds until now except [place].    Per admission workup on 08/24/23, VBG PH 7.46. CMP with Cr 1.08, albumin 3.1. BNP 2536. CBC diff unremarkable. Hs trop 42 >38. Repeat BMP today with K 2.9, glucose 173, Cr 0.99. COVID/RSV/Flu negative. CXR showed cardiomegaly with vascular  congestion and increased interstitial opacities suspicious for edema. EKG showed sinus rhythm, LVH, no acute changes. She required BIPAP support at ED, BP 200s,  is now admitted to hospitalist, started on IV lasix 40mg  BID. Cardiology is consulted today for CHF.   Per chart review, she has not been by AHF team since 06/25/2022. She was initially admitted for new onset systolic heart failure 11/2021, in the setting of HTN emergency 213/40. Echo 11/29/21 showed LVEF 45%, mild LVH, indeterminate diastolic, mild reduced RV, normal PASP, mild LAE, mod MR. R/L heart cath 11/30/21 showed no significant CAD, low filling pressures, RA 1, RV 21/1, PA 22/4, PCWP2, LV 90/6, AP 92/56, CO 5.43 and CI 3.21. cMRI 12/03/21 showed normal LV size with mild LV hypertrophy. EF 31%, diffuse hypokinesis.Normal RV size with mild systolic dysfunction, EF 38%.  Delayed enhancement images difficult due to recent Feraheme, but no definite LGE noted. She was started on GDMT. Echo repeat 01/17/2022 showed improved LVEF 55-60%, grade I DD, normal RV and PASP, mild MR. Etiology of cardiomyopathy likely due to poorly controlled HTN. Antiphospholipid antibody syndrome was suspected given hx of multiple miscarriages, serologic workup for APLAS were negative. 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   She was last seen by AHF team on 06/25/22, had NYHA class I- II symptoms, BP not controlled, added Bidil and continued on Entresto 97/103/spironolactone 25/coreg 25 BID/farxiga 10 for GDMT. Of note, she was seen by CTS for anterior mediastinal nodular mass 12/28/21, recommend repeat CT and labs in  3 month, but no follow up was completed. She had several ER visits in 06/2022 and 12/2022 and 07/2023, all records noted she was hypertensive.       Past Medical History:  Diagnosis Date   Anemia    Ectopic pregnancy    L adnexa   Hx of varicella    Hypertension    Sleep apnea     Past Surgical History:  Procedure Laterality Date   ARM SURGERY     CESAREAN  SECTION N/A 06/29/2016   Procedure: CESAREAN SECTION;  Surgeon: Maxie Better, MD;  Location: [facility];  Service: Obstetrics;  Laterality: N/A;   RIGHT/LEFT HEART CATH AND CORONARY ANGIOGRAPHY N/A 11/30/2021   Procedure: RIGHT/LEFT HEART CATH AND CORONARY ANGIOGRAPHY;  Surgeon: Laurey Morale, MD;  Location: [facility] INVASIVE CV LAB;  Service: Cardiovascular;  Laterality: N/A;     Home Medications:  Prior to Admission medications   Medication Sig Start Date End Date Taking? Authorizing Provider  amLODipine (NORVASC) 10 MG tablet Take 1 tablet (10 mg total) by mouth daily. 04/17/22  Yes Laurey Morale, MD  atorvastatin (LIPITOR) 80 MG tablet Take 1 tablet (80 mg total) by mouth daily. 04/03/22  Yes Laurey Morale, MD  carvedilol (COREG) 25 MG tablet Take 1 tablet (25 mg total) by mouth 2 (two) times daily with a meal. 04/17/22  Yes Laurey Morale, MD  dapagliflozin propanediol (FARXIGA) 10 MG TABS tablet Take 1 tablet (10 mg total) by mouth daily before breakfast. 12/07/21  Yes Milford, Anderson Malta, FNP  ferrous sulfate 325 (65 FE) MG tablet Take 1 tablet (325 mg total) by mouth every other day. 12/04/21  Yes Levin Erp, MD  isosorbide-hydrALAZINE (BIDIL) 20-37.5 MG tablet Take 1 tablet by mouth 3 (three) times daily. 06/25/22  Yes Milford, Anderson Malta, FNP  levalbuterol [facility] HFA) 45 MCG/ACT inhaler Inhale 1-2 puffs into the lungs every 6 (six) hours as needed for wheezing. 08/19/23  Yes Tanda Rockers A, DO  Multiple Vitamin (MV-ONE PO) Take 1 capsule by mouth every morning.   Yes [provider]  sacubitril-valsartan (ENTRESTO) 97-103 MG Take 1 tablet by mouth 2 (two) times daily. NEEDS FOLLOW UP APPOINTMENT FOR ANYMORE REFILLS. 02/05/23  Yes Milford, Anderson Malta, FNP  spironolactone (ALDACTONE) 25 MG tablet Take 1 tablet (25 mg total) by mouth daily. 12/07/21  Yes Milford, Anderson Malta, FNP  guaiFENesin-codeine 100-10 MG/5ML syrup Take 5 mLs by mouth 3 (three) times daily as needed  for cough. Patient not taking: Reported on 08/25/2023 08/19/23   Sloan Leiter, DO    Inpatient Medications: Scheduled Meds:  atorvastatin  80 mg Oral Daily   carvedilol  25 mg Oral BID WC   [START ON 08/26/2023] dapagliflozin propanediol  10 mg Oral QAC breakfast   enoxaparin (LOVENOX) injection  40 mg Subcutaneous Q24H   ferrous sulfate  325 mg Oral QODAY   furosemide  40 mg Intravenous BID   isosorbide-hydrALAZINE  1 tablet Oral TID   potassium chloride  40 mEq Oral Q4H   sacubitril-valsartan  1 tablet Oral BID   spironolactone  25 mg Oral Daily   Continuous Infusions:  nitroGLYCERIN 160 mcg/min (08/25/23 0857)   PRN Meds: acetaminophen **OR** acetaminophen, guaiFENesin, hydrALAZINE, levalbuterol, metoprolol tartrate, senna-docusate, traZODone  Allergies:    Allergies  Allergen Reactions   Shellfish Allergy Anaphylaxis    Social History:   Social History   Socioeconomic History   Marital status: Married    Spouse name: Not on  file   Number of children: Not on file   Years of education: Not on file   Highest education level: Not on file  Occupational History   Not on file  Tobacco Use   Smoking status: Never   Smokeless tobacco: Never  Vaping Use   Vaping status: Never Used  Substance and Sexual Activity   Alcohol use: No   Drug use: No   Sexual activity: Yes    Birth control/protection: None  Other Topics Concern   Not on file  Social History Narrative   Not on file   Social Determinants of Health   Financial Resource Strain: Not on file  Food Insecurity: Not on file  Transportation Needs: Not on file  Physical Activity: Not on file  Stress: Not on file  Social Connections: Not on file  Intimate Partner Violence: Not on file    Family History:    Family History  Problem Relation Age of Onset   Cancer Mother    Heart disease Mother    Heart disease Father    Hypertension Father    Stroke Father    Cancer Maternal Aunt    Diabetes Maternal  Grandmother      ROS:  Constitutional: Denied fever, chills, malaise, night sweats Eyes: Denied vision change or loss Ears/Nose/Mouth/Throat: Denied ear ache, sore throat, coughing, sinus pain Cardiovascular: see HPI  Respiratory: see HPI  Gastrointestinal: Denied nausea, vomiting, abdominal pain, diarrhea Genital/Urinary: Denied dysuria, hematuria, urinary frequency/urgency Musculoskeletal: Denied muscle ache, joint pain, weakness Skin: Denied rash, wound Neuro: Denied headache, dizziness, syncope Psych: Denied history of depression/anxiety  Endocrine: Denied history of diabetes     Physical Exam/Data:   Vitals:   08/25/23 0630 08/25/23 0745 08/25/23 0827 08/25/23 1135  BP: 131/78 (!) 141/89  (!) 172/104  Pulse: 84 84 89 91  Resp: (!) 22 19 (!) 28 20  Temp:    98.8 F (37.1 C)  TempSrc:    Oral  SpO2: 98% 100% 98% 96%    Intake/Output Summary (Last 24 hours) at 08/25/2023 1249 Last data filed at 08/25/2023 0146 Gross per 24 hour  Intake --  Output 1650 ml  Net -1650 ml      08/18/2023    8:30 PM 01/08/2023    7:55 PM 07/05/2022   10:05 AM  Last 3 Weights  Weight (lbs) 175 lb 179 lb 185 lb  Weight (kg) 79.379 kg 81.194 kg 83.915 kg     There is no height or weight on file to calculate BMI.   Vitals:  Vitals:   08/25/23 0827 08/25/23 1135  BP:  (!) 172/104  Pulse: 89 91  Resp: (!) 28 20  Temp:  98.8 F (37.1 C)  SpO2: 98% 96%   General Appearance: In no apparent distress, standing independently  HEENT: Normocephalic, atraumatic.  Neck: Supple, trachea midline, no JVDs Cardiovascular: Regular rate and rhythm, normal S1-S2,  no murmur Respiratory: Resting breathing unlabored, lungs sounds clear to auscultation bilaterally, no use of accessory muscles. On room air.  No wheezes, rales or rhonchi.   Gastrointestinal: Bowel sounds positive, abdomen soft Extremities: Able to move all extremities in bed without difficulty, trace edema of BLE Musculoskeletal:  Normal muscle bulk and tone Skin: Intact, warm, dry. No rashes or petechiae noted in exposed areas.  Neurologic: Alert, oriented to person, place and time. no gross focal neuro deficit Psychiatric: Normal affect. Mood is appropriate.    EKG:  The EKG was personally reviewed and demonstrates:  EKG from 08/24/23 sinus tachycardia 111bpm, significant artifacts limiting review   EKG today showed sinus rhythm 99bpm, LVH, non-specific T wave abnormalities, no acute changes   Telemetry:  Telemetry was personally reviewed and demonstrates:    Sinus rhythm, occasional PVC   Relevant CV Studies:   Echo from 01/18/23:  1. Left ventricular ejection fraction, by estimation, is 55 to 60%. The  left ventricle has normal function. The left ventricle has no regional  wall motion abnormalities. Left ventricular diastolic parameters are  consistent with Grade I diastolic  dysfunction (impaired relaxation).   2. Right ventricular systolic function is normal. The right ventricular  size is normal. There is normal pulmonary artery systolic pressure. The  estimated right ventricular systolic pressure is 20.8 mmHg.   3. Left atrial size was mildly dilated.   4. The mitral valve is normal in structure. Mild mitral valve  regurgitation. No evidence of mitral stenosis.   5. The aortic valve is tricuspid. Aortic valve regurgitation is not  visualized. No aortic stenosis is present.   6. The inferior vena cava is normal in size with greater than 50%  respiratory variability, suggesting right atrial pressure of 3 mmHg.    cMRI on 12/03/21:   IMPRESSION: 1. Normal LV size with mild LV hypertrophy. EF 31%, diffuse hypokinesis.   2.  Normal RV size with mild systolic dysfunction, EF 38%.   3. Delayed enhancement images difficult due to recent Feraheme, but no definite LGE noted.    R/L heart cath 11/30/21:   RA mean 1 RV 21/1 PA 22/4, mean 12 PCWP mean 2 LV 90/6 AO 92/56  Oxygen  saturations: PA 69% AO 98%  Cardiac Output (Fick) 5.43  Cardiac Index (Fick) 3.21  1. No significant coronary disease, nonischemic cardiomyopathy.  2. Low filling pressures.  3. Preserved cardiac output.    Laboratory Data:  High Sensitivity Troponin:   Recent Labs  Lab 08/24/23 2231 08/25/23 0043  TROPONINIHS 42* 38*     Chemistry Recent Labs  Lab 08/24/23 2224 08/24/23 2231 08/25/23 0322 08/25/23 0831  NA 137 136 137  --   K 3.8 3.7 2.9*  --   CL  --  101 99  --   CO2  --  22 24  --   GLUCOSE  --  114* 173*  --   BUN  --  17 16  --   CREATININE  --  1.08* 0.99  --   CALCIUM  --  8.5* 8.6*  --   MG  --   --   --  1.7  GFRNONAA  --  >60 >60  --   ANIONGAP  --  13 14  --     Recent Labs  Lab 08/24/23 2231  PROT 6.1*  ALBUMIN 3.1*  AST 35  ALT 36  ALKPHOS 66  BILITOT 0.7   Lipids No results for input(s): "CHOL", "TRIG", "HDL", "LABVLDL", "LDLCALC", "CHOLHDL" in the last 168 hours.  Hematology Recent Labs  Lab 08/24/23 2217 08/24/23 2224 08/25/23 0831  WBC 8.3  --  8.7  RBC 4.79  --  4.34  HGB 13.1 14.6 12.3  HCT 42.5 43.0 37.0  MCV 88.7  --  85.3  MCH 27.3  --  28.3  MCHC 30.8  --  33.2  RDW 16.8*  --  16.3*  PLT 329  --  287   Thyroid No results for input(s): "TSH", "FREET4" in the last 168 hours.  BNP Recent Labs  Lab 08/24/23  2217  BNP 2,536.7*    DDimer No results for input(s): "DDIMER" in the last 168 hours.   Radiology/Studies:  DG Chest Port 1 View  Result Date: 08/24/2023 CLINICAL DATA:  Shortness of breath EXAM: PORTABLE CHEST 1 VIEW COMPARISON:  08/18/2023 FINDINGS: Cardiomegaly with vascular congestion and increased interstitial opacities suspicious for edema. No pleural effusion. No consolidation or pneumothorax. IMPRESSION: Cardiomegaly with vascular congestion and increased interstitial opacities suspicious for edema. Electronically Signed   By: Jasmine Pang M.D.   On: 08/24/2023 22:32     Assessment and Plan:   Acute  on chronic systolic heart failure  Non-ischemic cardiomyopathy  - presented with cough, SOB, leg edema, orthopnea for several weeks  - [id number], POA - CXR showed cardiomegaly with vascular congestion and increased interstitial opacities suspicious for edema, POA - Echo update pending - continue IV Lasix 40mg  BID for 24 hours, trend BMP, daily weight, I&Os - GDMT: continue PTA coreg 25mg  BID, entresto 97/103 BID, spironolactone 25mg  daily, farxiga 10mg  daily, bidil 20/37.5 TID (ran out of farxiga since 09/2022) - will need follow up with AHF team upon discharge   HTN emergency  - on  PTA coreg 25mg  BID, entresto 97/103 BID, spironolactone 25mg  daily, bidil 20/37.5 TID, denied non-compliance, currently also on IV lasix 40mg  BID and nitroglycerin gtt, remains hypertensive 170s systolic, will add amlodipine 10mg  daily today, if remains high, will stop Bidil and increase dosing on hydralazine and imdur gradually   Elevated troponin  - Hs trop 42 >38, EKG no acute changes  - she had no CAD on heart cath 11/2021  - this is demand ischemia for HTN emergency, diuresis and re-assess symptoms        Risk Assessment/Risk Scores:    New York Heart Association (NYHA) Functional Class NYHA Class III        For questions or updates, please contact [facility] Please consult [email or web address] for contact info under    Signed, Cyndi Bender, NP  08/25/2023 12:49 PM  Personally seen and examined. Agree with above.  42 year old female with nonischemic cardiomyopathy likely secondary to hypertension here with acute systolic heart failure.  She is on high-dose goal-directed medical therapy.  We have added amlodipine.  IV diuresis 40 mg twice daily.  Nitroglycerin drip.  Hopefully this will be able to be weaned as fluid status improves.  She reports compliance.  Pleasant.  Basilar crackles.  Donato Schultz, MD

## 2023-08-25 NOTE — ED Notes (Addendum)
ED TO INPATIENT HANDOFF REPORT  ED Nurse Name and Phone #: Reita Cliche [phone]  S Name/Age/Gender Alice Hays 42 y.o. female Room/Bed: TRACC/TRACC  Code Status   Code Status: Full Code  Home/SNF/Other Home Patient oriented to: A/O x4 Is this baseline? Yes   Triage Complete: Triage complete  Chief Complaint Acute on chronic heart failure with preserved ejection fraction (HFpEF) (HCC) [I50.33]  Triage Note Patient arrived with EMS from home reports SOB with chest congestion/productive cough this evening , diagnosed this week with Bronchitis with no Rx, history of CHF . She received Solumedrol 125 mg and Albuterol/Atrovent nebulizer prior to arrival by EMS . CBG=99.   Allergies Allergies  Allergen Reactions   Shellfish Allergy Anaphylaxis    Level of Care/Admitting Diagnosis ED Disposition     ED Disposition  Admit   Condition  --   Comment  Hospital Area: [facility] [[id number]]  Level of Care: Progressive [102]  Admit to Progressive based on following criteria: CARDIOVASCULAR & THORACIC of moderate stability with acute coronary syndrome symptoms/low risk myocardial infarction/hypertensive urgency/arrhythmias/heart failure potentially compromising stability and stable post cardiovascular intervention patients.  May admit patient to [facility] or [facility] if equivalent level of care is available:: Yes  Covid Evaluation: Asymptomatic - no recent exposure (last 10 days) testing not required  Diagnosis: Acute on chronic heart failure with preserved ejection fraction (HFpEF) [facility]) [[id number]]  Admitting Physician: John Giovanni [[id number]]  Attending Physician: John Giovanni [[id number]]  Certification:: I certify this patient will need inpatient services for at least 2 midnights  Expected Medical Readiness: 08/26/2023          B Medical/Surgery History Past Medical History:  Diagnosis Date   Anemia    Ectopic pregnancy    L adnexa   Hx of  varicella    Hypertension    Sleep apnea    Past Surgical History:  Procedure Laterality Date   ARM SURGERY     CESAREAN SECTION N/A 06/29/2016   Procedure: CESAREAN SECTION;  Surgeon: Maxie Better, MD;  Location: [facility];  Service: Obstetrics;  Laterality: N/A;   RIGHT/LEFT HEART CATH AND CORONARY ANGIOGRAPHY N/A 11/30/2021   Procedure: RIGHT/LEFT HEART CATH AND CORONARY ANGIOGRAPHY;  Surgeon: Laurey Morale, MD;  Location: [facility] INVASIVE CV LAB;  Service: Cardiovascular;  Laterality: N/A;     A IV Location/Drains/Wounds Patient Lines/Drains/Airways Status     Active Line/Drains/Airways     Name Placement date Placement time Site Days   Peripheral IV 08/24/23 18 G Left Antecubital 08/24/23  --  Antecubital  1   Peripheral IV 08/24/23 18 G Right Antecubital 08/24/23  2320  Antecubital  1   External Urinary Catheter 08/24/23  2308  --  1            Intake/Output Last 24 hours No intake or output data in the 24 hours ending 08/25/23 0109  Labs/Imaging Results for orders placed or performed during the hospital encounter of 08/24/23 (from the past 48 hour(s))  CBC with Differential     Status: Abnormal   Collection Time: 08/24/23 10:17 PM  Result Value Ref Range   WBC 8.3 4.0 - 10.5 K/uL   RBC 4.79 3.87 - 5.11 MIL/uL   Hemoglobin 13.1 12.0 - 15.0 g/dL   HCT 53.6 64.4 - 03.4 %   MCV 88.7 80.0 - 100.0 fL   MCH 27.3 26.0 - 34.0 pg   MCHC 30.8 30.0 - 36.0 g/dL   RDW 74.2 (H)  11.5 - 15.5 %   Platelets 329 150 - 400 K/uL   nRBC 0.0 0.0 - 0.2 %   Neutrophils Relative % 46 %   Neutro Abs 3.8 1.7 - 7.7 K/uL   Lymphocytes Relative 37 %   Lymphs Abs 3.1 0.7 - 4.0 K/uL   Monocytes Relative 11 %   Monocytes Absolute 0.9 0.1 - 1.0 K/uL   Eosinophils Relative 5 %   Eosinophils Absolute 0.4 0.0 - 0.5 K/uL   Basophils Relative 1 %   Basophils Absolute 0.1 0.0 - 0.1 K/uL   Immature Granulocytes 0 %   Abs Immature Granulocytes 0.03 0.00 - 0.07 K/uL    Comment:  Performed at [facility], [address] [place], [place] [address]  Brain natriuretic peptide     Status: Abnormal   Collection Time: 08/24/23 10:17 PM  Result Value Ref Range   B Natriuretic Peptide 2,536.7 (H) 0.0 - 100.0 pg/mL    Comment: Performed at [facility], [address], [place], [place] [address]  I-Stat venous blood gas, [facility], [facility], [facility])     Status: Abnormal   Collection Time: 08/24/23 10:24 PM  Result Value Ref Range   pH, Ven 7.460 (H) 7.25 - 7.43   pCO2, Ven 31.3 (L) 44 - 60 mmHg   pO2, Ven 97 (H) 32 - 45 mmHg   Bicarbonate 22.2 20.0 - 28.0 mmol/L   TCO2 23 22 - 32 mmol/L   O2 Saturation 98 %   Acid-base deficit 1.0 0.0 - 2.0 mmol/L   Sodium 137 135 - 145 mmol/L   Potassium 3.8 3.5 - 5.1 mmol/L   Calcium, Ion 1.02 (L) 1.15 - 1.40 mmol/L   HCT 43.0 36.0 - 46.0 %   Hemoglobin 14.6 12.0 - 15.0 g/dL   Sample type VENOUS   Comprehensive metabolic panel     Status: Abnormal   Collection Time: 08/24/23 10:31 PM  Result Value Ref Range   Sodium 136 135 - 145 mmol/L   Potassium 3.7 3.5 - 5.1 mmol/L   Chloride 101 98 - 111 mmol/L   CO2 22 22 - 32 mmol/L   Glucose, Bld 114 (H) 70 - 99 mg/dL    Comment: Glucose reference range applies only to samples taken after fasting for at least 8 hours.   BUN 17 6 - 20 mg/dL   Creatinine, Ser 8.11 (H) 0.44 - 1.00 mg/dL   Calcium 8.5 (L) 8.9 - 10.3 mg/dL   Total Protein 6.1 (L) 6.5 - 8.1 g/dL   Albumin 3.1 (L) 3.5 - 5.0 g/dL   AST 35 15 - 41 U/L   ALT 36 0 - 44 U/L   Alkaline Phosphatase 66 38 - 126 U/L   Total Bilirubin 0.7 0.3 - 1.2 mg/dL   GFR, Estimated >91 >47 mL/min    Comment: (NOTE) Calculated using the CKD-EPI Creatinine Equation (2021)    Anion gap 13 5 - 15    Comment: Performed at [facility], [address] [place], [place] [address]  Troponin I (High Sensitivity)     Status: Abnormal   Collection Time: 08/24/23 10:31 PM  Result Value Ref Range   Troponin I (High Sensitivity) 42 (H) <18  ng/L    Comment: (NOTE) Elevated high sensitivity troponin I (hsTnI) values and significant  changes across serial measurements may suggest ACS but many other  chronic and acute conditions are known to elevate hsTnI results.  Refer to the "Links" section for  chest pain algorithms and additional  guidance. Performed at [facility], [address] [place], [place] [address]   Resp panel by RT-PCR (RSV, Flu A&B, Covid) Anterior Nasal Swab     Status: None   Collection Time: 08/24/23 10:32 PM   Specimen: Anterior Nasal Swab  Result Value Ref Range   SARS Coronavirus 2 by RT PCR NEGATIVE NEGATIVE   Influenza A by PCR NEGATIVE NEGATIVE   Influenza B by PCR NEGATIVE NEGATIVE    Comment: (NOTE) The Xpert Xpress SARS-CoV-2/FLU/RSV plus assay is intended as an aid in the diagnosis of influenza from Nasopharyngeal swab specimens and should not be used as a sole basis for treatment. Nasal washings and aspirates are unacceptable for Xpert Xpress SARS-CoV-2/FLU/RSV testing.  Fact Sheet for Patients: BloggerCourse.com  Fact Sheet for Healthcare Providers: SeriousBroker.it  This test is not yet approved or cleared by the [place] FDA and has been authorized for detection and/or diagnosis of SARS-CoV-2 by FDA under an Emergency Use Authorization (EUA). This EUA will remain in effect (meaning this test can be used) for the duration of the COVID-19 declaration under Section 564(b)(1) of the Act, 21 U.S.C. section 360bbb-3(b)(1), unless the authorization is terminated or revoked.     Resp Syncytial Virus by PCR NEGATIVE NEGATIVE    Comment: (NOTE) Fact Sheet for Patients: BloggerCourse.com  Fact Sheet for Healthcare Providers: SeriousBroker.it  This test is not yet approved or cleared by the [place] FDA and has been authorized for detection and/or diagnosis of SARS-CoV-2  by FDA under an Emergency Use Authorization (EUA). This EUA will remain in effect (meaning this test can be used) for the duration of the COVID-19 declaration under Section 564(b)(1) of the Act, 21 U.S.C. section 360bbb-3(b)(1), unless the authorization is terminated or revoked.  Performed at [facility], [address] [place], [place] [address]    DG Chest Port 1 View  Result Date: 08/24/2023 CLINICAL DATA:  Shortness of breath EXAM: PORTABLE CHEST 1 VIEW COMPARISON:  08/18/2023 FINDINGS: Cardiomegaly with vascular congestion and increased interstitial opacities suspicious for edema. No pleural effusion. No consolidation or pneumothorax. IMPRESSION: Cardiomegaly with vascular congestion and increased interstitial opacities suspicious for edema. Electronically Signed   By: Jasmine Pang M.D.   On: 08/24/2023 22:32    Pending Labs Unresulted Labs (From admission, onward)     Start     Ordered   08/25/23 0500  Basic metabolic panel  Tomorrow morning,   R        08/25/23 0054   08/25/23 0100  HIV Antibody (routine testing w rflx)  Once,   R        08/25/23 0100            Vitals/Pain Today's Vitals   08/25/23 0040 08/25/23 0055 08/25/23 0100 08/25/23 0106  BP: (!) 161/104  (!) 161/130   Pulse: 98 100 99   Resp: (!) 29 (!) 22 20   Temp:      TempSrc:      SpO2: 97% 100% 99%   PainSc:    0-No pain    Isolation Precautions No active isolations  Medications Medications  nitroGLYCERIN 50 mg in dextrose 5 % 250 mL (0.2 mg/mL) infusion (200 mcg/min Intravenous Rate/Dose Change 08/25/23 0026)  furosemide (LASIX) injection 40 mg (has no administration in time range)  enoxaparin (LOVENOX) injection 40 mg (has no administration in time range)  acetaminophen (TYLENOL) tablet 650 mg (has no administration in time range)  Or  acetaminophen (TYLENOL) suppository 650 mg (has no administration in time range)  furosemide (LASIX) injection 40 mg (40 mg Intravenous Given  08/24/23 2258)  furosemide (LASIX) injection 40 mg (40 mg Intravenous Given 08/25/23 0104)    Mobility walks     Focused Assessments Cardiac Assessment Handoff:    No results found for: "CKTOTAL", "CKMB", "CKMBINDEX", "TROPONINI" Lab Results  Component Value Date   DDIMER 1.95 (H) 11/28/2021   Does the Patient currently have chest pain?   , Neuro Assessment Handoff:  Swallow screen pass? Pt is presently on CPAP         Neuro Assessment:   Neuro Checks:      Has TPA been given?  If patient is a Neuro Trauma and patient is going to OR before floor call report to 4N Charge nurse: [phone] or [phone]  , Renal Assessment Handoff:  Hemodialysis Schedule:  Last Hemodialysis date and time:   Restricted appendage: left arm  , Pulmonary Assessment Handoff:  Lung sounds: Bilateral Breath Sounds: Diminished L Breath Sounds: Diminished R Breath Sounds: Diminished O2 Device: CPAP      R Recommendations: See Admitting Provider Note  Report given to:   Additional Notes:  Pt is very pleasant, and kind. Pt has improved and is resting comfortably. Has pitting edema with increased fluid to abdominal region. Pt is presently on CPAP. Pt reports she does not take any fluid prescriptions and has been "on/off" with diet.

## 2023-08-25 NOTE — Progress Notes (Signed)
Pt called out, stated that she felt dizzy after getting up from the bedside commode. Rechecked BP, systolic in the 80s. Nitro gtt stopped.

## 2023-08-25 NOTE — Progress Notes (Signed)
Heart Failure Navigator Progress Note  Assessed for Heart & Vascular TOC clinic readiness.  Patient does not meet criteria due to Advanced Heart Failure Team patient of Dr. McLean.   Navigator will sign off at this time.   Mahreen Schewe, BSN, RN Heart Failure Nurse Navigator Secure Chat Only   

## 2023-08-26 DIAGNOSIS — I160 Hypertensive urgency: Secondary | ICD-10-CM | POA: Diagnosis not present

## 2023-08-26 DIAGNOSIS — I5033 Acute on chronic diastolic (congestive) heart failure: Secondary | ICD-10-CM | POA: Diagnosis not present

## 2023-08-26 DIAGNOSIS — I509 Heart failure, unspecified: Secondary | ICD-10-CM | POA: Diagnosis not present

## 2023-08-26 LAB — BASIC METABOLIC PANEL
Anion gap: 8 (ref 5–15)
BUN: 15 mg/dL (ref 6–20)
CO2: 26 mmol/L (ref 22–32)
Calcium: 8.5 mg/dL — ABNORMAL LOW (ref 8.9–10.3)
Chloride: 103 mmol/L (ref 98–111)
Creatinine, Ser: 1.04 mg/dL — ABNORMAL HIGH (ref 0.44–1.00)
GFR, Estimated: >60 mL/min (ref >60)
Glucose, Bld: 128 mg/dL — ABNORMAL HIGH (ref 70–99)
Potassium: 3.8 mmol/L (ref 3.5–5.1)
Sodium: 137 mmol/L (ref 135–145)

## 2023-08-26 LAB — PHOSPHORUS: Phosphorus: 4 mg/dL (ref 2.5–4.6)

## 2023-08-26 LAB — MAGNESIUM: Magnesium: 1.8 mg/dL (ref 1.7–2.4)

## 2023-08-26 NOTE — Progress Notes (Signed)
   08/25/23 2340  BiPAP/CPAP/SIPAP  Reason BIPAP/CPAP not in use  (no distress noted at this time. Bipap on standby)

## 2023-08-26 NOTE — Progress Notes (Addendum)
Patient Name: Alice Hays Date of Encounter: 08/26/2023 [facility] Cardiologist: None   Interval Summary  .    Leg swelling and shortness of breath is improving.  She still has a persistent cough.  Diuresing well -2.5 L.  Vital Signs .    Vitals:   08/26/23 0004 08/26/23 0412 08/26/23 0644 08/26/23 0757  BP: 103/70 109/70 (!) 130/91 132/81  Pulse: 85 75 80 78  Resp: 19 (!) 21 20 20   Temp: 98.3 F (36.8 C) 98.3 F (36.8 C)  98.3 F (36.8 C)  TempSrc: Oral Oral  Oral  SpO2: 95% 97% 94% 96%  Weight:  82.3 kg      Intake/Output Summary (Last 24 hours) at 08/26/2023 1039 Last data filed at 08/26/2023 0900 Gross per 24 hour  Intake 1201.54 ml  Output 2625 ml  Net -1423.46 ml      08/26/2023    4:12 AM 08/18/2023    8:30 PM 01/08/2023    7:55 PM  Last 3 Weights  Weight (lbs) 181 lb 7 oz 175 lb 179 lb  Weight (kg) 82.3 kg 79.379 kg 81.194 kg      Telemetry/ECG    Normal sinus rhythm heart rate in the 70s.  PVCs.- Personally Reviewed  CV Studies    Echocardiogram 08/25/2023 1. Left ventricular ejection fraction, by estimation, is 40 to 45%. The  left ventricle has mildly decreased function. The left ventricle  demonstrates global hypokinesis. There is mild asymmetric left ventricular  hypertrophy of the lateral segment.  Indeterminate diastolic filling due to E-A fusion.   2. Right ventricular systolic function is low normal. The right  ventricular size is normal. Tricuspid regurgitation signal is inadequate  for assessing PA pressure.   3. Left atrial size was severely dilated.   4. Right atrial size was severely dilated.   5. The mitral valve is normal in structure. Moderate mitral valve  regurgitation.   6. The aortic valve is normal in structure. Aortic valve regurgitation is  trivial.   7. The inferior vena cava is normal in size with <50% respiratory  variability, suggesting right atrial pressure of 8 mmHg.   Physical Exam .   GEN:  No acute distress.   Neck: mild JVD Cardiac: RRR, no murmurs, rubs, or gallops.  Respiratory: Clear to auscultation bilaterally. GI: Soft, nontender, non-distended  MS: 1-2+ edema worse in right leg  Patient Profile    Alice Hays is a 42 y.o. female with a hx of HTN, multiple miscarriages, mediastinal mass, chronic systolic heart failure with improved LVEF 12/2021,  non-ischemic cardiomyopathy, who was admitted 08/25/2023 for the evaluation of CHF.   Assessment & Plan .     Acute on chronic heart failure with mildly reduced EF Nonischemic cardiomyopathy Previous EF in February 2023 showed 45%.  Underwent right and left cardiac catheterization that did not show significant CAD.  She had preserved cardiac output with low filling pressures.  EF had normalized in March 2023.  However echocardiogram this admission shows EF 40 to 45% with low normal RV function.  Chest x-ray with signs of pulmonary congestion here due to worsening shortness of breath, peripheral edema and orthopnea.  Symptoms have improved with IV Lasix 40 mg twice daily.  She diuresed 2.2 L yesterday.  Still has volume on and has persistent cough.  Will continue with IV Lasix.  Creatinine 1.04, stable. Continue IV Lasix 40 mg twice daily GDMT: Carvedilol 25 mg twice daily, Entresto 97/103 mg twice  daily, spironolactone 25 mg, Farxiga 10 mg (had been out of this since December 2023), BiDil 20/37.5 mg 3 times daily. Followed by advanced heart failure. Reports baseline weight to be around 175 and 180.  Currently in 181 here.   Hypertensive urgency Initially presented with systolics as high as 200.  She was weaned off nitroglycerin drip.  We added amlodipine 10 mg which has significantly improved her BP.  Currently around 130s systolic.  Demand ischemia Troponin 42-38.  EKG without any ischemic changes.  Clean cardiac catheterization in 2023.  OSA not on CPAP Reported to have previous sleep study and was working on  getting CPAP machine but still has not obtained this.  Will start this here nightly.  For questions or updates, please contact [facility] Please consult [email or web address] for contact info under        Signed, Abagail Kitchens, PA-C    Personally seen and examined. Agree with above.  42 year old with acute systolic heart failure mildly reduced ejection fraction EF 40 to 45% - Continue with IV Lasix 40 mg twice a day - Currently on carvedilol 25 mg twice a day Entresto 97/103 twice a day spironolactone 25 mg a day Farxiga 10 mg a day BiDil 20/37.5 mg 3 times a day.  Blood pressures markedly improved.  She is off nitroglycerin drip. - If blood pressures continue to decrease, I would stop the BiDil first then the amlodipine which was just started.  Thankfully she does not feel dizziness. -Lower extremity edema appears improved.  Breathing is better. -Demand ischemia secondary to heart failure and blood pressure troponin 48-32.  Normal coronary arteries in 2023. Hypertensive urgency has resolved.  Donato Schultz, MD

## 2023-08-26 NOTE — Progress Notes (Signed)
   08/26/23 1324  Assess: MEWS Score  Temp 97.8 F (36.6 C)  BP 90/72  MAP (mmHg) 78  Pulse Rate 78  ECG Heart Rate 81  Resp (!) 21  Level of Consciousness Alert  SpO2 99 %  O2 Device Room Air  Assess: MEWS Score  MEWS Temp 0  MEWS Systolic 1  MEWS Pulse 0  MEWS RR 1  MEWS LOC 0  MEWS Score 2  MEWS Score Color Yellow  Assess: if the MEWS score is Yellow or Red  Were vital signs accurate and taken at a resting state? Yes  Does the patient meet 2 or more of the SIRS criteria? No  MEWS guidelines implemented  No, previously yellow, continue vital signs every 4 hours  Notify: Charge Nurse/RN  Name of Charge Nurse/RN Notified Maureen Ralphs, RN  Assess: SIRS CRITERIA  SIRS Temperature  0  SIRS Pulse 0  SIRS Respirations  1  SIRS WBC 0  SIRS Score Sum  1

## 2023-08-26 NOTE — Plan of Care (Signed)

## 2023-08-26 NOTE — Plan of Care (Signed)
  Problem: Activity: Goal: Capacity to carry out activities will improve Outcome: Progressing   Problem: Clinical Measurements: Goal: Respiratory complications will improve Outcome: Progressing   Problem: Activity: Goal: Risk for activity intolerance will decrease Outcome: Progressing   Problem: Nutrition: Goal: Adequate nutrition will be maintained Outcome: Progressing   Problem: Pain Management: Goal: General experience of comfort will improve Outcome: Progressing

## 2023-08-26 NOTE — Hospital Course (Addendum)
  Brief Narrative:  42 y.o. female with medical history significant of CHF/nonischemic cardiomyopathy, hypertension, history of multiple miscarriages/spontaneous abortions, anterior mediastinal mass, OSA on CPAP, hyperlipidemia presented to the ED with shortness of breath.  Patient initially given Solu-Medrol and bronchodilators but symptoms persisted therefore placed on BiPAP.  Initial blood pressure was also elevated.  Cardiology team consulted.  Home medications have been restarted, slowly blood pressure has been improving.  Echocardiogram showed EF 45%.  GDMT per cardiology, patient doing much better.  Will discharge her in stable condition with outpatient follow-up.     Assessment & Plan:  Principal Problem:   Acute on chronic heart failure with preserved ejection fraction (HFpEF) (HCC) Active Problems:   Hypertensive emergency   Acute hypoxemic respiratory failure (HCC)   Elevated troponin    Acute on chronic HFrEF, EF 45% Hypertensive emergency Acute hypoxemic respiratory failure Initially on BiPAP and nitroglycerin drip which has now been weaned off.  Continue diuretics, GDMT per cardiology team. Echocardiogram-EF 45%, known dilated cardiomyopathy Cardiac medications upon discharge-Coreg, Entresto, Aldactone, Farxiga   Hypokalemia/hypocalcemia -Replete.     Elevated troponin Suspect demand ischemia.  Troponins are flat.   Essential hypertension - Medications as above.  IV as needed   Hyperlipidemia - Statin   Anemia of chronic disease - Iron supplements.   History of multiple miscarriages - Workup in the past has been negative       DVT prophylaxis: enoxaparin  Code Status: Full Family Communication:   Status is: Inpatient Hopefully discharge today once cleared by cardiology     Subjective: Medically doing well.  Wants to go home.   Examination:   General exam: Appears calm and comfortable  Respiratory system: Clear to auscultation. Respiratory effort  normal. Cardiovascular system: S1 & S2 heard, RRR. No JVD, murmurs, rubs, gallops or clicks. No pedal edema. Gastrointestinal system: Abdomen is nondistended, soft and nontender. No organomegaly or masses felt. Normal bowel sounds heard. Central nervous system: Alert and oriented. No focal neurological deficits. Extremities: Symmetric 5 x 5 power. Skin: No rashes, lesions or ulcers Psychiatry: Judgement and insight appear normal. Mood & affect appropriate.

## 2023-08-26 NOTE — Progress Notes (Signed)
Heart Failure Navigator Progress Note  Assessed for Heart & Vascular TOC clinic readiness.  Patient does not meet criteria due to established Heart Failure patient of Dr. Shirlee Latch.   Navigator will sign off at this time.  Roxy Horseman, RN, BSN [facility] Heart Failure Navigator Secure Chat Only

## 2023-08-26 NOTE — Progress Notes (Signed)
PROGRESS NOTE    Alice Hays  UJW:[id number] DOB: 1981-10-14 DOA: 08/24/2023 PCP: Pcp, No     Brief Narrative:  42 y.o. female with medical history significant of CHF/nonischemic cardiomyopathy, hypertension, history of multiple miscarriages/spontaneous abortions, anterior mediastinal mass, OSA on CPAP, hyperlipidemia presented to the ED with shortness of breath.  Patient initially given Solu-Medrol and bronchodilators but symptoms persisted therefore placed on BiPAP.  Initial blood pressure was also elevated.  Cardiology team consulted.  Home medications have been restarted, slowly blood pressure has been improving.     Assessment & Plan:  Principal Problem:   Acute on chronic heart failure with preserved ejection fraction (HFpEF) (HCC) Active Problems:   Hypertensive emergency   Acute hypoxemic respiratory failure (HCC)   Elevated troponin    Acute on chronic HFrEF, EF 45% Hypertensive emergency Acute hypoxemic respiratory failure Initially on BiPAP, weaned off.  Elevated BNP and chest x-ray showing congestion. - Continue IV Lasix.  Home medications restarted.  Will slowly wean off nitroglycerin drip. Echocardiogram-EF 45%, known dilated cardiomyopathy Continue home medications including Farxiga, Coreg, BiDil, Entresto, Aldactone   Hypokalemia/hypocalcemia -Replete.     Elevated troponin Suspect demand ischemia.  Troponins are flat.   Essential hypertension - Medications as above.  IV as needed   Hyperlipidemia - Statin   Anemia of chronic disease - Iron supplements.   History of multiple miscarriages - Workup in the past has been negative       DVT prophylaxis: enoxaparin (LOVENOX) injection 40 mg Start: 08/25/23 1000 Code Status: Full Family Communication:  Spouse at bedside Status is: Inpatient Remains inpatient appropriate because: Severe SOB, cont hosp stay       Subjective: Feeling better. Off Nitroglycerine drip, headaches improved.  Off  BiPAP. Still has some orthopnea.      Examination:   General exam: Appears calm and comfortable  Respiratory system: Clear to auscultation. Respiratory effort normal. Cardiovascular system: S1 & S2 heard, RRR. No JVD, murmurs, rubs, gallops or clicks. No pedal edema. Gastrointestinal system: Abdomen is nondistended, soft and nontender. No organomegaly or masses felt. Normal bowel sounds heard. Central nervous system: Alert and oriented. No focal neurological deficits. Extremities: Symmetric 5 x 5 power. Skin: No rashes, lesions or ulcers Psychiatry: Judgement and insight appear normal. Mood & affect appropriate.            Diet Orders (From admission, onward)     Start     Ordered   08/25/23 1014  Diet Heart Room service appropriate? Yes; Fluid consistency: Thin; Fluid restriction: 1500 mL Fluid  Diet effective now       Question Answer Comment  Room service appropriate? Yes   Fluid consistency: Thin   Fluid restriction: 1500 mL Fluid      08/25/23 1013            Objective: Vitals:   08/26/23 0004 08/26/23 0412 08/26/23 0644 08/26/23 0757  BP: 103/70 109/70 (!) 130/91 132/81  Pulse: 85 75 80 78  Resp: 19 (!) 21 20 20   Temp: 98.3 F (36.8 C) 98.3 F (36.8 C)  98.3 F (36.8 C)  TempSrc: Oral Oral  Oral  SpO2: 95% 97% 94% 96%  Weight:  82.3 kg      Intake/Output Summary (Last 24 hours) at 08/26/2023 0938 Last data filed at 08/26/2023 0900 Gross per 24 hour  Intake 1201.54 ml  Output 2625 ml  Net -1423.46 ml   Filed Weights   08/26/23 0412  Weight: 82.3 kg  Scheduled Meds:  amLODipine  10 mg Oral Daily   atorvastatin  80 mg Oral Daily   carvedilol  25 mg Oral BID WC   dapagliflozin propanediol  10 mg Oral QAC breakfast   enoxaparin (LOVENOX) injection  40 mg Subcutaneous Q24H   ferrous sulfate  325 mg Oral QODAY   furosemide  40 mg Intravenous BID   isosorbide-hydrALAZINE  1 tablet Oral TID   sacubitril-valsartan  1 tablet Oral BID    spironolactone  25 mg Oral Daily   Continuous Infusions:  Nutritional status     Body mass index is 37.92 kg/m.  Data Reviewed:   CBC: Recent Labs  Lab 08/24/23 2217 08/24/23 2224 08/25/23 0831  WBC 8.3  --  8.7  NEUTROABS 3.8  --   --   HGB 13.1 14.6 12.3  HCT 42.5 43.0 37.0  MCV 88.7  --  85.3  PLT 329  --  287   Basic Metabolic Panel: Recent Labs  Lab 08/24/23 2224 08/24/23 2231 08/25/23 0322 08/25/23 0831 08/26/23 0359  NA 137 136 137  --  137  K 3.8 3.7 2.9*  --  3.8  CL  --  101 99  --  103  CO2  --  22 24  --  26  GLUCOSE  --  114* 173*  --  128*  BUN  --  17 16  --  15  CREATININE  --  1.08* 0.99  --  1.04*  CALCIUM  --  8.5* 8.6*  --  8.5*  MG  --   --   --  1.7 1.8  PHOS  --   --   --  3.6 4.0   GFR: Estimated Creatinine Clearance: 65.3 mL/min (A) (by C-G formula based on SCr of 1.04 mg/dL (H)). Liver Function Tests: Recent Labs  Lab 08/24/23 2231  AST 35  ALT 36  ALKPHOS 66  BILITOT 0.7  PROT 6.1*  ALBUMIN 3.1*   No results for input(s): "LIPASE", "AMYLASE" in the last 168 hours. No results for input(s): "AMMONIA" in the last 168 hours. Coagulation Profile: No results for input(s): "INR", "PROTIME" in the last 168 hours. Cardiac Enzymes: No results for input(s): "CKTOTAL", "CKMB", "CKMBINDEX", "TROPONINI" in the last 168 hours. BNP (last 3 results) No results for input(s): "PROBNP" in the last 8760 hours. HbA1C: No results for input(s): "HGBA1C" in the last 72 hours. CBG: No results for input(s): "GLUCAP" in the last 168 hours. Lipid Profile: No results for input(s): "CHOL", "HDL", "LDLCALC", "TRIG", "CHOLHDL", "LDLDIRECT" in the last 72 hours. Thyroid Function Tests: No results for input(s): "TSH", "T4TOTAL", "FREET4", "T3FREE", "THYROIDAB" in the last 72 hours. Anemia Panel: No results for input(s): "VITAMINB12", "FOLATE", "FERRITIN", "TIBC", "IRON", "RETICCTPCT" in the last 72 hours. Sepsis Labs: No results for input(s):  "PROCALCITON", "LATICACIDVEN" in the last 168 hours.  Recent Results (from the past 240 hour(s))  Resp panel by RT-PCR (RSV, Flu A&B, Covid) Anterior Nasal Swab     Status: None   Collection Time: 08/18/23  8:42 PM   Specimen: Anterior Nasal Swab  Result Value Ref Range Status   SARS Coronavirus 2 by RT PCR NEGATIVE NEGATIVE Final   Influenza A by PCR NEGATIVE NEGATIVE Final   Influenza B by PCR NEGATIVE NEGATIVE Final    Comment: (NOTE) The Xpert Xpress SARS-CoV-2/FLU/RSV plus assay is intended as an aid in the diagnosis of influenza from Nasopharyngeal swab specimens and should not be used as a sole basis for treatment. Nasal  washings and aspirates are unacceptable for Xpert Xpress SARS-CoV-2/FLU/RSV testing.  Fact Sheet for Patients: BloggerCourse.com  Fact Sheet for Healthcare Providers: SeriousBroker.it  This test is not yet approved or cleared by the [place] FDA and has been authorized for detection and/or diagnosis of SARS-CoV-2 by FDA under an Emergency Use Authorization (EUA). This EUA will remain in effect (meaning this test can be used) for the duration of the COVID-19 declaration under Section 564(b)(1) of the Act, 21 U.S.C. section 360bbb-3(b)(1), unless the authorization is terminated or revoked.     Resp Syncytial Virus by PCR NEGATIVE NEGATIVE Final    Comment: (NOTE) Fact Sheet for Patients: BloggerCourse.com  Fact Sheet for Healthcare Providers: SeriousBroker.it  This test is not yet approved or cleared by the [place] FDA and has been authorized for detection and/or diagnosis of SARS-CoV-2 by FDA under an Emergency Use Authorization (EUA). This EUA will remain in effect (meaning this test can be used) for the duration of the COVID-19 declaration under Section 564(b)(1) of the Act, 21 U.S.C. section 360bbb-3(b)(1), unless the authorization is  terminated or revoked.  Performed at [facility], [address], [place], [place] [address]   Resp panel by RT-PCR (RSV, Flu A&B, Covid) Anterior Nasal Swab     Status: None   Collection Time: 08/24/23 10:32 PM   Specimen: Anterior Nasal Swab  Result Value Ref Range Status   SARS Coronavirus 2 by RT PCR NEGATIVE NEGATIVE Final   Influenza A by PCR NEGATIVE NEGATIVE Final   Influenza B by PCR NEGATIVE NEGATIVE Final    Comment: (NOTE) The Xpert Xpress SARS-CoV-2/FLU/RSV plus assay is intended as an aid in the diagnosis of influenza from Nasopharyngeal swab specimens and should not be used as a sole basis for treatment. Nasal washings and aspirates are unacceptable for Xpert Xpress SARS-CoV-2/FLU/RSV testing.  Fact Sheet for Patients: BloggerCourse.com  Fact Sheet for Healthcare Providers: SeriousBroker.it  This test is not yet approved or cleared by the [place] FDA and has been authorized for detection and/or diagnosis of SARS-CoV-2 by FDA under an Emergency Use Authorization (EUA). This EUA will remain in effect (meaning this test can be used) for the duration of the COVID-19 declaration under Section 564(b)(1) of the Act, 21 U.S.C. section 360bbb-3(b)(1), unless the authorization is terminated or revoked.     Resp Syncytial Virus by PCR NEGATIVE NEGATIVE Final    Comment: (NOTE) Fact Sheet for Patients: BloggerCourse.com  Fact Sheet for Healthcare Providers: SeriousBroker.it  This test is not yet approved or cleared by the [place] FDA and has been authorized for detection and/or diagnosis of SARS-CoV-2 by FDA under an Emergency Use Authorization (EUA). This EUA will remain in effect (meaning this test can be used) for the duration of the COVID-19 declaration under Section 564(b)(1) of the Act, 21 U.S.C. section 360bbb-3(b)(1), unless the authorization  is terminated or revoked.  Performed at [facility], [address] [place], [place] [address]          Radiology Studies: ECHOCARDIOGRAM COMPLETE  Result Date: 08/25/2023    ECHOCARDIOGRAM REPORT   Patient Name:   Alice Hays Date of Exam: 08/25/2023 Medical Rec #:  [id number]          Height:       58.0 in Accession #:    [id number]         Weight:       175.0 lb Date of Birth:  10-24-1981  BSA:          1.721 m Patient Age:    41 years           BP:           158/99 mmHg Patient Gender: F                  HR:           91 bpm. Exam Location:  Inpatient Procedure: 2D Echo, Color Doppler and Cardiac Doppler Indications:    I50.31 Acute diastolic (congestive) heart failure  History:        Patient has prior history of Echocardiogram examinations, most                 recent 01/17/2022. Risk Factors:Hypertension and Sleep Apnea.  Sonographer:    Irving Burton Senior RDCS Referring Phys: [id number] VASUNDHRA RATHORE IMPRESSIONS  1. Left ventricular ejection fraction, by estimation, is 40 to 45%. The left ventricle has mildly decreased function. The left ventricle demonstrates global hypokinesis. There is mild asymmetric left ventricular hypertrophy of the lateral segment. Indeterminate diastolic filling due to E-A fusion.  2. Right ventricular systolic function is low normal. The right ventricular size is normal. Tricuspid regurgitation signal is inadequate for assessing PA pressure.  3. Left atrial size was severely dilated.  4. Right atrial size was severely dilated.  5. The mitral valve is normal in structure. Moderate mitral valve regurgitation.  6. The aortic valve is normal in structure. Aortic valve regurgitation is trivial.  7. The inferior vena cava is normal in size with <50% respiratory variability, suggesting right atrial pressure of 8 mmHg. FINDINGS  Left Ventricle: Left ventricular ejection fraction, by estimation, is 40 to 45%. The left ventricle has mildly decreased function.  The left ventricle demonstrates global hypokinesis. The left ventricular internal cavity size was normal in size. There is  mild asymmetric left ventricular hypertrophy of the lateral segment. Indeterminate diastolic filling due to E-A fusion. Right Ventricle: The right ventricular size is normal. No increase in right ventricular wall thickness. Right ventricular systolic function is low normal. Tricuspid regurgitation signal is inadequate for assessing PA pressure. Left Atrium: Left atrial size was severely dilated. Right Atrium: Right atrial size was severely dilated. Pericardium: There is no evidence of pericardial effusion. Mitral Valve: The mitral valve is normal in structure. Moderate mitral valve regurgitation. Tricuspid Valve: The tricuspid valve is normal in structure. Tricuspid valve regurgitation is trivial. Aortic Valve: The aortic valve is normal in structure. Aortic valve regurgitation is trivial. Pulmonic Valve: The pulmonic valve was normal in structure. Pulmonic valve regurgitation is trivial. Aorta: The aortic root and ascending aorta are structurally normal, with no evidence of dilitation. Venous: The inferior vena cava is normal in size with less than 50% respiratory variability, suggesting right atrial pressure of 8 mmHg. IAS/Shunts: No atrial level shunt detected by color flow Doppler.  LEFT VENTRICLE PLAX 2D LVIDd:         5.70 cm LVIDs:         4.40 cm LV PW:         1.10 cm LV IVS:        0.80 cm LVOT diam:     2.20 cm LV SV:         68 LV SV Index:   40 LVOT Area:     3.80 cm  LV Volumes (MOD) LV vol d, MOD A2C: 168.0 ml LV vol d, MOD A4C: 167.0 ml LV vol s,  MOD A2C: 107.0 ml LV vol s, MOD A4C: 97.3 ml LV SV MOD A2C:     61.0 ml LV SV MOD A4C:     167.0 ml LV SV MOD BP:      66.8 ml RIGHT VENTRICLE RV S prime:     14.90 cm/s TAPSE (M-mode): 2.7 cm LEFT ATRIUM             Index        RIGHT ATRIUM           Index LA diam:        3.90 cm 2.27 cm/m   RA Area:     24.80 cm LA Vol (A2C):    80.0 ml 46.49 ml/m  RA Volume:   75.60 ml  43.94 ml/m LA Vol (A4C):   81.6 ml 47.42 ml/m LA Biplane Vol: 80.7 ml 46.90 ml/m  AORTIC VALVE LVOT Vmax:   99.30 cm/s LVOT Vmean:  78.700 cm/s LVOT VTI:    0.179 m  AORTA Ao Root diam: 2.90 cm Ao Asc diam:  3.20 cm MR Peak grad:    124.1 mmHg MR Mean grad:    83.0 mmHg    SHUNTS MR Vmax:         557.00 cm/s  Systemic VTI:  0.18 m MR Vmean:        434.0 cm/s   Systemic Diam: 2.20 cm MR PISA:         1.57 cm MR PISA Eff ROA: 11 mm MR PISA Radius:  0.50 cm Aditya Sabharwal Electronically signed by Dorthula Nettles Signature Date/Time: 08/25/2023/3:38:51 PM    Final    DG Chest Port 1 View  Result Date: 08/24/2023 CLINICAL DATA:  Shortness of breath EXAM: PORTABLE CHEST 1 VIEW COMPARISON:  08/18/2023 FINDINGS: Cardiomegaly with vascular congestion and increased interstitial opacities suspicious for edema. No pleural effusion. No consolidation or pneumothorax. IMPRESSION: Cardiomegaly with vascular congestion and increased interstitial opacities suspicious for edema. Electronically Signed   By: Jasmine Pang M.D.   On: 08/24/2023 22:32           LOS: 2 days   Time spent= 35 mins    Miguel Rota, MD [facility]  If 7PM-7AM, please contact night-coverage  08/26/2023, 9:38 AM

## 2023-08-26 NOTE — Plan of Care (Signed)

## 2023-08-27 DIAGNOSIS — I5033 Acute on chronic diastolic (congestive) heart failure: Secondary | ICD-10-CM | POA: Diagnosis not present

## 2023-08-27 LAB — BASIC METABOLIC PANEL
Anion gap: 10 (ref 5–15)
BUN: 18 mg/dL (ref 6–20)
CO2: 26 mmol/L (ref 22–32)
Calcium: 8.8 mg/dL — ABNORMAL LOW (ref 8.9–10.3)
Chloride: 100 mmol/L (ref 98–111)
Creatinine, Ser: 1.1 mg/dL — ABNORMAL HIGH (ref 0.44–1.00)
GFR, Estimated: >60 mL/min (ref >60)
Glucose, Bld: 101 mg/dL — ABNORMAL HIGH (ref 70–99)
Potassium: 3.7 mmol/L (ref 3.5–5.1)
Sodium: 136 mmol/L (ref 135–145)

## 2023-08-27 LAB — MAGNESIUM: Magnesium: 1.9 mg/dL (ref 1.7–2.4)

## 2023-08-27 NOTE — Progress Notes (Signed)
   08/27/23 2237  BiPAP/CPAP/SIPAP  Reason BIPAP/CPAP not in use Non-compliant  BiPAP/CPAP /SiPAP Vitals  Resp (!) 29  SpO2 97 %  Bilateral Breath Sounds Clear;Diminished  MEWS Score/Color  MEWS Score 2  MEWS Score Color Yellow

## 2023-08-27 NOTE — Progress Notes (Signed)
   Patient Name: Alice Hays Date of Encounter: 08/27/2023 [facility] Cardiologist: None [place]  Interval Summary  .    Blood pressure has markedly improved.  Pulling back on amlodipine and BiDil.  Feels better.  Vital Signs .    Vitals:   08/26/23 2305 08/27/23 0310 08/27/23 0400 08/27/23 0800  BP: 122/76 114/75  100/71  Pulse: 75 78  84  Resp: (!) 23 18  20   Temp: 98.2 F (36.8 C) 98 F (36.7 C)  97.7 F (36.5 C)  TempSrc: Oral Oral  Oral  SpO2: 98% 95%  98%  Weight:   80.6 kg   Height:    4\' 10"  (1.473 m)    Intake/Output Summary (Last 24 hours) at 08/27/2023 1042 Last data filed at 08/27/2023 1018 Gross per 24 hour  Intake 720 ml  Output 2400 ml  Net -1680 ml      08/27/2023    4:00 AM 08/26/2023    4:12 AM 08/18/2023    8:30 PM  Last 3 Weights  Weight (lbs) 177 lb 11.2 oz 181 lb 7 oz 175 lb  Weight (kg) 80.604 kg 82.3 kg 79.379 kg      Telemetry/ECG    PVCs, normal sinus rhythm- Personally Reviewed  Physical Exam .   GEN: No acute distress.   Neck: No JVD Cardiac: RRR, no murmurs, rubs, or gallops.  Respiratory: Clear to auscultation bilaterally. GI: Soft, nontender, non-distended  MS: Mild edema, improved  Assessment & Plan .     42 year old with acute systolic heart failure EF 40% with severe hypertension -Continue with IV Lasix 40 mg twice a day - I have stopped BiDil 20/37.5 mg 3 times a day and amlodipine 10 mg a day given that blood pressures were in the upper 90s recently. -Lets continue with carvedilol 25 mg twice a day Entresto 97/103 twice a day spironolactone 25 mg a day and Farxiga 10 mg a day.  Hypertensive urgency - Resolved nitro off  Demand ischemia - Secondary to heart failure and blood pressure elevation.  Troponin was 48.  Normal coronary arteries in 2023.  Nonischemic cardiomyopathy.   For questions or updates, please contact [facility] Please consult [email or web address] for contact info under         Signed, Donato Schultz, MD

## 2023-08-27 NOTE — Consult Note (Addendum)
[facility]   Consult   08/27/2023  Alice Hays [birth year] [id number]  [facility] [[facility]]   Primary Care Provider: Pcp, No, patient states she does not have a provider.    Insurance:  [employer]   [facility] Liaison met patient at bedside at [facility].  Patient states she would like a follow and get a PCP.  Updated inpatient [facility] RN.    The patient was screened for post hospital follow up needs for readmission prevention as discussed with inpatient [facility] RNCM and will assist patient with having a post hospital follow up.  The patient was assessed for potential Community Care Coordination service needs for post hospital transition for care coordination. Review of patient's electronic medical record reveals patient is home with SO. She denies any SDOH needs as listed with no needs.Expressed patient the need for having a PCP to assist with managing health goals.  Patient verbalizes understanding and agreeable to inpatient [facility] team assistance for follow up as she states no preference. Patient states she doesn't have a scale but states, "I can get one."  Encourage importance of weigh daily.  She verbalized understanding.Will update inpatient [facility] RN.  Plan: [facility] Liaison will continue to follow progress and disposition to asess for post hospital community care coordination/management needs.  Referral request for community care coordination: Will check for post hospital follow up appointment and for nursing follow up for HF.   [facility] does not replace or interfere with any arrangements made by the Inpatient Transition of Care team.   For questions contact:   Charlesetta Shanks, RN, BSN, CCM [place]  [facility], [facility] Liaison Direct Dial: [phone] or secure chat Website: [name]

## 2023-08-27 NOTE — Plan of Care (Signed)
  Problem: Activity: Goal: Capacity to carry out activities will improve Outcome: Progressing   Problem: Cardiac: Goal: Ability to achieve and maintain adequate cardiopulmonary perfusion will improve Outcome: Progressing   Problem: Education: Goal: Knowledge of General Education information will improve Description: Including pain rating scale, medication(s)/side effects and non-pharmacologic comfort measures Outcome: Progressing   Problem: Coping: Goal: Level of anxiety will decrease Outcome: Progressing

## 2023-08-27 NOTE — Progress Notes (Signed)
PROGRESS NOTE    Alice Hays  WUX:[id number] DOB: [birth year] DOA: 08/24/2023 PCP: Pcp, No     Brief Narrative:  42 y.o. female with medical history significant of CHF/nonischemic cardiomyopathy, hypertension, history of multiple miscarriages/spontaneous abortions, anterior mediastinal mass, OSA on CPAP, hyperlipidemia presented to the ED with shortness of breath.  Patient initially given Solu-Medrol and bronchodilators but symptoms persisted therefore placed on BiPAP.  Initial blood pressure was also elevated.  Cardiology team consulted.  Home medications have been restarted, slowly blood pressure has been improving.     Assessment & Plan:  Principal Problem:   Acute on chronic heart failure with preserved ejection fraction (HFpEF) (HCC) Active Problems:   Hypertensive emergency   Acute hypoxemic respiratory failure (HCC)   Elevated troponin    Acute on chronic HFrEF, EF 45% Hypertensive emergency Acute hypoxemic respiratory failure Initially on BiPAP and nitroglycerin drip which has now been weaned off.  Continue diuretics, GDMT per cardiology team. Echocardiogram-EF 45%, known dilated cardiomyopathy Medication adjustments per cardiology   Hypokalemia/hypocalcemia -Replete.     Elevated troponin Suspect demand ischemia.  Troponins are flat.   Essential hypertension - Medications as above.  IV as needed   Hyperlipidemia - Statin   Anemia of chronic disease - Iron supplements.   History of multiple miscarriages - Workup in the past has been negative       DVT prophylaxis: enoxaparin (LOVENOX) injection 40 mg Start: 08/25/23 1000 Code Status: Full Family Communication:   Status is: Inpatient Remains inpatient appropriate because: Severe SOB, cont hosp stay Will discharge once cleared by cardiology     Subjective: Feeling better no new complaints.     Examination:   General exam: Appears calm and comfortable  Respiratory system: Clear to  auscultation. Respiratory effort normal. Cardiovascular system: S1 & S2 heard, RRR. No JVD, murmurs, rubs, gallops or clicks. No pedal edema. Gastrointestinal system: Abdomen is nondistended, soft and nontender. No organomegaly or masses felt. Normal bowel sounds heard. Central nervous system: Alert and oriented. No focal neurological deficits. Extremities: Symmetric 5 x 5 power. Skin: No rashes, lesions or ulcers Psychiatry: Judgement and insight appear normal. Mood & affect appropriate.           Diet Orders (From admission, onward)     Start     Ordered   08/25/23 1014  Diet Heart Room service appropriate? Yes; Fluid consistency: Thin; Fluid restriction: 1500 mL Fluid  Diet effective now       Question Answer Comment  Room service appropriate? Yes   Fluid consistency: Thin   Fluid restriction: 1500 mL Fluid      08/25/23 1013            Objective: Vitals:   08/27/23 0310 08/27/23 0400 08/27/23 0800 08/27/23 1100  BP: 114/75  100/71 (!) 87/53  Pulse: 78  84   Resp: 18  20 19   Temp: 98 F (36.7 C)  97.7 F (36.5 C) 97.7 F (36.5 C)  TempSrc: Oral  Oral   SpO2: 95%  98% 98%  Weight:  80.6 kg    Height:   4\' 10"  (1.473 m)     Intake/Output Summary (Last 24 hours) at 08/27/2023 1132 Last data filed at 08/27/2023 1018 Gross per 24 hour  Intake 720 ml  Output 2400 ml  Net -1680 ml   Filed Weights   08/26/23 0412 08/27/23 0400  Weight: 82.3 kg 80.6 kg    Scheduled Meds:  atorvastatin  80 mg Oral Daily  carvedilol  25 mg Oral BID WC   dapagliflozin propanediol  10 mg Oral QAC breakfast   enoxaparin (LOVENOX) injection  40 mg Subcutaneous Q24H   ferrous sulfate  325 mg Oral QODAY   furosemide  40 mg Intravenous BID   sacubitril-valsartan  1 tablet Oral BID   spironolactone  25 mg Oral Daily   Continuous Infusions:  Nutritional status     Body mass index is 37.14 kg/m.  Data Reviewed:   CBC: Recent Labs  Lab 08/24/23 2217 08/24/23 2224  08/25/23 0831  WBC 8.3  --  8.7  NEUTROABS 3.8  --   --   HGB 13.1 14.6 12.3  HCT 42.5 43.0 37.0  MCV 88.7  --  85.3  PLT 329  --  287   Basic Metabolic Panel: Recent Labs  Lab 08/24/23 2224 08/24/23 2231 08/25/23 0322 08/25/23 0831 08/26/23 0359 08/27/23 0352  NA 137 136 137  --  137 136  K 3.8 3.7 2.9*  --  3.8 3.7  CL  --  101 99  --  103 100  CO2  --  22 24  --  26 26  GLUCOSE  --  114* 173*  --  128* 101*  BUN  --  17 16  --  15 18  CREATININE  --  1.08* 0.99  --  1.04* 1.10*  CALCIUM  --  8.5* 8.6*  --  8.5* 8.8*  MG  --   --   --  1.7 1.8 1.9  PHOS  --   --   --  3.6 4.0  --    GFR: Estimated Creatinine Clearance: 61 mL/min (A) (by C-G formula based on SCr of 1.1 mg/dL (H)). Liver Function Tests: Recent Labs  Lab 08/24/23 2231  AST 35  ALT 36  ALKPHOS 66  BILITOT 0.7  PROT 6.1*  ALBUMIN 3.1*   No results for input(s): "LIPASE", "AMYLASE" in the last 168 hours. No results for input(s): "AMMONIA" in the last 168 hours. Coagulation Profile: No results for input(s): "INR", "PROTIME" in the last 168 hours. Cardiac Enzymes: No results for input(s): "CKTOTAL", "CKMB", "CKMBINDEX", "TROPONINI" in the last 168 hours. BNP (last 3 results) No results for input(s): "PROBNP" in the last 8760 hours. HbA1C: No results for input(s): "HGBA1C" in the last 72 hours. CBG: No results for input(s): "GLUCAP" in the last 168 hours. Lipid Profile: No results for input(s): "CHOL", "HDL", "LDLCALC", "TRIG", "CHOLHDL", "LDLDIRECT" in the last 72 hours. Thyroid Function Tests: No results for input(s): "TSH", "T4TOTAL", "FREET4", "T3FREE", "THYROIDAB" in the last 72 hours. Anemia Panel: No results for input(s): "VITAMINB12", "FOLATE", "FERRITIN", "TIBC", "IRON", "RETICCTPCT" in the last 72 hours. Sepsis Labs: No results for input(s): "PROCALCITON", "LATICACIDVEN" in the last 168 hours.  Recent Results (from the past 240 hour(s))  Resp panel by RT-PCR (RSV, Flu A&B, Covid)  Anterior Nasal Swab     Status: None   Collection Time: 08/18/23  8:42 PM   Specimen: Anterior Nasal Swab  Result Value Ref Range Status   SARS Coronavirus 2 by RT PCR NEGATIVE NEGATIVE Final   Influenza A by PCR NEGATIVE NEGATIVE Final   Influenza B by PCR NEGATIVE NEGATIVE Final    Comment: (NOTE) The Xpert Xpress SARS-CoV-2/FLU/RSV plus assay is intended as an aid in the diagnosis of influenza from Nasopharyngeal swab specimens and should not be used as a sole basis for treatment. Nasal washings and aspirates are unacceptable for Xpert Xpress SARS-CoV-2/FLU/RSV testing.  Fact Sheet  for Patients: BloggerCourse.com  Fact Sheet for Healthcare Providers: SeriousBroker.it  This test is not yet approved or cleared by the [place] FDA and has been authorized for detection and/or diagnosis of SARS-CoV-2 by FDA under an Emergency Use Authorization (EUA). This EUA will remain in effect (meaning this test can be used) for the duration of the COVID-19 declaration under Section 564(b)(1) of the Act, 21 U.S.C. section 360bbb-3(b)(1), unless the authorization is terminated or revoked.     Resp Syncytial Virus by PCR NEGATIVE NEGATIVE Final    Comment: (NOTE) Fact Sheet for Patients: BloggerCourse.com  Fact Sheet for Healthcare Providers: SeriousBroker.it  This test is not yet approved or cleared by the [place] FDA and has been authorized for detection and/or diagnosis of SARS-CoV-2 by FDA under an Emergency Use Authorization (EUA). This EUA will remain in effect (meaning this test can be used) for the duration of the COVID-19 declaration under Section 564(b)(1) of the Act, 21 U.S.C. section 360bbb-3(b)(1), unless the authorization is terminated or revoked.  Performed at [facility], [address], [place], [place] [address]   Resp panel by RT-PCR (RSV, Flu A&B,  Covid) Anterior Nasal Swab     Status: None   Collection Time: 08/24/23 10:32 PM   Specimen: Anterior Nasal Swab  Result Value Ref Range Status   SARS Coronavirus 2 by RT PCR NEGATIVE NEGATIVE Final   Influenza A by PCR NEGATIVE NEGATIVE Final   Influenza B by PCR NEGATIVE NEGATIVE Final    Comment: (NOTE) The Xpert Xpress SARS-CoV-2/FLU/RSV plus assay is intended as an aid in the diagnosis of influenza from Nasopharyngeal swab specimens and should not be used as a sole basis for treatment. Nasal washings and aspirates are unacceptable for Xpert Xpress SARS-CoV-2/FLU/RSV testing.  Fact Sheet for Patients: BloggerCourse.com  Fact Sheet for Healthcare Providers: SeriousBroker.it  This test is not yet approved or cleared by the [place] FDA and has been authorized for detection and/or diagnosis of SARS-CoV-2 by FDA under an Emergency Use Authorization (EUA). This EUA will remain in effect (meaning this test can be used) for the duration of the COVID-19 declaration under Section 564(b)(1) of the Act, 21 U.S.C. section 360bbb-3(b)(1), unless the authorization is terminated or revoked.     Resp Syncytial Virus by PCR NEGATIVE NEGATIVE Final    Comment: (NOTE) Fact Sheet for Patients: BloggerCourse.com  Fact Sheet for Healthcare Providers: SeriousBroker.it  This test is not yet approved or cleared by the [place] FDA and has been authorized for detection and/or diagnosis of SARS-CoV-2 by FDA under an Emergency Use Authorization (EUA). This EUA will remain in effect (meaning this test can be used) for the duration of the COVID-19 declaration under Section 564(b)(1) of the Act, 21 U.S.C. section 360bbb-3(b)(1), unless the authorization is terminated or revoked.  Performed at [facility], [address], [place], [place] [address]          Radiology  Studies: ECHOCARDIOGRAM COMPLETE  Result Date: 08/25/2023    ECHOCARDIOGRAM REPORT   Patient Name:   Alice Hays Date of Exam: 08/25/2023 Medical Rec #:  [id number]          Height:       58.0 in Accession #:    [id number]         Weight:       175.0 lb Date of Birth:  10-26-1981          BSA:  1.721 m Patient Age:    41 years           BP:           158/99 mmHg Patient Gender: F                  HR:           91 bpm. Exam Location:  Inpatient Procedure: 2D Echo, Color Doppler and Cardiac Doppler Indications:    I50.31 Acute diastolic (congestive) heart failure  History:        Patient has prior history of Echocardiogram examinations, most                 recent 01/17/2022. Risk Factors:Hypertension and Sleep Apnea.  Sonographer:    Irving Burton Senior RDCS Referring Phys: [id number] VASUNDHRA RATHORE IMPRESSIONS  1. Left ventricular ejection fraction, by estimation, is 40 to 45%. The left ventricle has mildly decreased function. The left ventricle demonstrates global hypokinesis. There is mild asymmetric left ventricular hypertrophy of the lateral segment. Indeterminate diastolic filling due to E-A fusion.  2. Right ventricular systolic function is low normal. The right ventricular size is normal. Tricuspid regurgitation signal is inadequate for assessing PA pressure.  3. Left atrial size was severely dilated.  4. Right atrial size was severely dilated.  5. The mitral valve is normal in structure. Moderate mitral valve regurgitation.  6. The aortic valve is normal in structure. Aortic valve regurgitation is trivial.  7. The inferior vena cava is normal in size with <50% respiratory variability, suggesting right atrial pressure of 8 mmHg. FINDINGS  Left Ventricle: Left ventricular ejection fraction, by estimation, is 40 to 45%. The left ventricle has mildly decreased function. The left ventricle demonstrates global hypokinesis. The left ventricular internal cavity size was normal in size. There is  mild  asymmetric left ventricular hypertrophy of the lateral segment. Indeterminate diastolic filling due to E-A fusion. Right Ventricle: The right ventricular size is normal. No increase in right ventricular wall thickness. Right ventricular systolic function is low normal. Tricuspid regurgitation signal is inadequate for assessing PA pressure. Left Atrium: Left atrial size was severely dilated. Right Atrium: Right atrial size was severely dilated. Pericardium: There is no evidence of pericardial effusion. Mitral Valve: The mitral valve is normal in structure. Moderate mitral valve regurgitation. Tricuspid Valve: The tricuspid valve is normal in structure. Tricuspid valve regurgitation is trivial. Aortic Valve: The aortic valve is normal in structure. Aortic valve regurgitation is trivial. Pulmonic Valve: The pulmonic valve was normal in structure. Pulmonic valve regurgitation is trivial. Aorta: The aortic root and ascending aorta are structurally normal, with no evidence of dilitation. Venous: The inferior vena cava is normal in size with less than 50% respiratory variability, suggesting right atrial pressure of 8 mmHg. IAS/Shunts: No atrial level shunt detected by color flow Doppler.  LEFT VENTRICLE PLAX 2D LVIDd:         5.70 cm LVIDs:         4.40 cm LV PW:         1.10 cm LV IVS:        0.80 cm LVOT diam:     2.20 cm LV SV:         68 LV SV Index:   40 LVOT Area:     3.80 cm  LV Volumes (MOD) LV vol d, MOD A2C: 168.0 ml LV vol d, MOD A4C: 167.0 ml LV vol s, MOD A2C: 107.0 ml LV vol s, MOD A4C: 97.3  ml LV SV MOD A2C:     61.0 ml LV SV MOD A4C:     167.0 ml LV SV MOD BP:      66.8 ml RIGHT VENTRICLE RV S prime:     14.90 cm/s TAPSE (M-mode): 2.7 cm LEFT ATRIUM             Index        RIGHT ATRIUM           Index LA diam:        3.90 cm 2.27 cm/m   RA Area:     24.80 cm LA Vol (A2C):   80.0 ml 46.49 ml/m  RA Volume:   75.60 ml  43.94 ml/m LA Vol (A4C):   81.6 ml 47.42 ml/m LA Biplane Vol: 80.7 ml 46.90 ml/m   AORTIC VALVE LVOT Vmax:   99.30 cm/s LVOT Vmean:  78.700 cm/s LVOT VTI:    0.179 m  AORTA Ao Root diam: 2.90 cm Ao Asc diam:  3.20 cm MR Peak grad:    124.1 mmHg MR Mean grad:    83.0 mmHg    SHUNTS MR Vmax:         557.00 cm/s  Systemic VTI:  0.18 m MR Vmean:        434.0 cm/s   Systemic Diam: 2.20 cm MR PISA:         1.57 cm MR PISA Eff ROA: 11 mm MR PISA Radius:  0.50 cm Aditya Sabharwal Electronically signed by Dorthula Nettles Signature Date/Time: 08/25/2023/3:38:51 PM    Final            LOS: 3 days   Time spent= 35 mins    Miguel Rota, MD [facility]  If 7PM-7AM, please contact night-coverage  08/27/2023, 11:32 AM

## 2023-08-27 NOTE — Progress Notes (Signed)
Mobility Specialist Progress Note:    08/27/23 1156  Mobility  Activity Ambulated with assistance in hallway  Level of Assistance Standby assist, set-up cues, supervision of patient - no hands on  Assistive Device None  Distance Ambulated (ft) 250 ft  Activity Response Tolerated well  Mobility Referral Yes  $Mobility charge 1 Mobility  Mobility Specialist Start Time (ACUTE ONLY) 1130  Mobility Specialist Stop Time (ACUTE ONLY) 1140  Mobility Specialist Time Calculation (min) (ACUTE ONLY) 10 min   Received pt in bed having no complaints and agreeable to mobility. Pt was asymptomatic throughout ambulation, no dizziness even though BP was low (see below) returned to room w/o fault. Left in bed w/ call bell in reach and all needs met.  Pre Mobility- BP 89/59 Post Mobility- BP 98/66  Thompson Grayer Mobility Specialist  Please contact vis Secure Chat or  Rehab Office [phone]

## 2023-08-27 NOTE — TOC Initial Note (Addendum)
Transition of Care [facility]) - Initial/Assessment Note    Patient Details  Name: Alice Hays MRN: [id number] Date of Birth: [birth year]  [facility] CM/SW Contact:    Leone Haven, RN Phone Number: 08/27/2023, 1:10 PM  Clinical Narrative:                 From home with spouse, has no  PCP , [facility] , follow up on AVS and insurance on file, states has no HH services in place at this time or DME at home.  She is not homebound, does not qualify for homebound status, still working.   States family member will transport them home at [employer] and family is support system, states gets medications from [place]  on [employer] [address].    Pta self ambulatory.  [facility] will be following patient in the Community as well.  She does not have a scale but will be able to purchase one.    Expected Discharge Plan- Home self Barriers to Discharge: Continued Medical Work up   Patient Goals and CMS Choice Patient states their goals for this hospitalization and ongoing recovery are:: return home CMS Medicare.gov Compare Post Acute Care list provided to:: Patient Choice offered to / list presented to : Patient      Expected Discharge Plan and Services In-house Referral: NA Discharge Planning Services: CM Consult Post Acute Care Choice: Home Health Living arrangements for the past 2 months: Single Family Home                 DME Arranged: N/A DME Agency: NA                  Prior Living Arrangements/Services Living arrangements for the past 2 months: Single Family Home Lives with:: Spouse Patient language and need for interpreter reviewed:: Yes Do you feel safe going back to the place where you live?: Yes      Need for Family Participation in Patient Care: Yes (Comment) Care giver support system in place?: Yes (comment)   Criminal Activity/Legal Involvement Pertinent to Current Situation/Hospitalization: No - Comment as  needed  Activities of Daily Living   ADL Screening (condition at time of admission) Independently performs ADLs?: Yes (appropriate for developmental age) Is the patient deaf or have difficulty hearing?: No Does the patient have difficulty seeing, even when wearing glasses/contacts?: No Does the patient have difficulty concentrating, remembering, or making decisions?: No  Permission Sought/Granted Permission sought to share information with : Case Manager Permission granted to share information with : Yes, Verbal Permission Granted              Emotional Assessment   Attitude/Demeanor/Rapport: Engaged Affect (typically observed): Appropriate Orientation: : Oriented to Self, Oriented to Place, Oriented to  Time, Oriented to Situation Alcohol / Substance Use: Not Applicable Psych Involvement: No (comment)  Admission diagnosis:  Hypertensive urgency [I16.0] Acute on chronic heart failure, unspecified heart failure type (HCC) [I50.9] Acute on chronic heart failure with preserved ejection fraction (HFpEF) (HCC) [I50.33] Patient Active Problem List   Diagnosis Date Noted   Hypertensive urgency 08/26/2023   Acute on chronic heart failure (HCC) 08/26/2023   Hypertensive emergency 08/25/2023   Acute hypoxemic respiratory failure (HCC) 08/25/2023   Elevated troponin 08/25/2023   Acute on chronic heart failure with preserved ejection fraction (HFpEF) (HCC) 08/24/2023   Hyperlipidemia 03/26/2022   Mediastinal mass    Cardiomyopathy (HCC)    Liver  cyst    Acute systolic CHF (congestive heart failure) (HCC)    Acute congestive heart failure (HCC) 11/29/2021   Miscarriage 01/28/2021   Primary hypertension 01/02/2019   Labor abnormal 06/29/2016    Postpartum care s/p cesarean section (9/2) 06/29/2016   PCP:  [facility]:   [facility] [id number] [place] - [address] [place] [address] Phone: [phone] Fax:  [phone]  [facility] [address] [place] [address] Phone: [phone] Fax: [phone]  [facility] [address] [place], [place] - [address] [place] [address] Phone: [phone] Fax: [phone]     Social Determinants of Health (SDOH) Social History: SDOH Screenings   Depression (PHQ2-9): Low Risk  (12/07/2021)  Tobacco Use: Low Risk  (08/25/2023)   SDOH Interventions:     Readmission Risk Interventions     No data to display

## 2023-08-28 DIAGNOSIS — I5033 Acute on chronic diastolic (congestive) heart failure: Secondary | ICD-10-CM | POA: Diagnosis not present

## 2023-08-28 LAB — BASIC METABOLIC PANEL
Anion gap: 7 (ref 5–15)
BUN: 21 mg/dL — ABNORMAL HIGH (ref 6–20)
CO2: 26 mmol/L (ref 22–32)
Calcium: 8.6 mg/dL — ABNORMAL LOW (ref 8.9–10.3)
Chloride: 100 mmol/L (ref 98–111)
Creatinine, Ser: 1.15 mg/dL — ABNORMAL HIGH (ref 0.44–1.00)
GFR, Estimated: >60 mL/min (ref >60)
Glucose, Bld: 104 mg/dL — ABNORMAL HIGH (ref 70–99)
Potassium: 3.4 mmol/L — ABNORMAL LOW (ref 3.5–5.1)
Sodium: 133 mmol/L — ABNORMAL LOW (ref 135–145)

## 2023-08-28 LAB — CBC
HCT: 46.5 % — ABNORMAL HIGH (ref 36.0–46.0)
Hemoglobin: 14.6 g/dL (ref 12.0–15.0)
MCH: 27.3 pg (ref 26.0–34.0)
MCHC: 31.4 g/dL (ref 30.0–36.0)
MCV: 87.1 fL (ref 80.0–100.0)
Platelets: 316 10*3/uL (ref 150–400)
RBC: 5.34 MIL/uL — ABNORMAL HIGH (ref 3.87–5.11)
RDW: 17.2 % — ABNORMAL HIGH (ref 11.5–15.5)
WBC: 6.5 10*3/uL (ref 4.0–10.5)
nRBC: 0 % (ref 0.0–0.2)

## 2023-08-28 LAB — MAGNESIUM: Magnesium: 2.1 mg/dL (ref 1.7–2.4)

## 2023-08-28 MED ORDER — SACUBITRIL-VALSARTAN 49-51 MG PO TABS
1.0000 | ORAL_TABLET | Freq: Two times a day (BID) | ORAL | Status: DC
  Administered 2023-08-28 – 2023-08-29 (×3): 1 via ORAL
  Filled 2023-08-28 (×3): qty 1

## 2023-08-28 MED ORDER — POTASSIUM CHLORIDE CRYS ER 20 MEQ PO TBCR
40.0000 meq | EXTENDED_RELEASE_TABLET | Freq: Once | ORAL | Status: AC
  Administered 2023-08-28: 40 meq via ORAL
  Filled 2023-08-28: qty 2

## 2023-08-28 MED ORDER — CARVEDILOL 12.5 MG PO TABS
12.5000 mg | ORAL_TABLET | Freq: Two times a day (BID) | ORAL | Status: DC
  Administered 2023-08-28 – 2023-08-29 (×2): 12.5 mg via ORAL
  Filled 2023-08-28 (×2): qty 1

## 2023-08-28 MED ORDER — SPIRONOLACTONE 12.5 MG HALF TABLET
12.5000 mg | ORAL_TABLET | Freq: Every day | ORAL | Status: DC
  Administered 2023-08-29: 12.5 mg via ORAL
  Filled 2023-08-28: qty 1

## 2023-08-28 NOTE — Progress Notes (Signed)
   Patient Name: Alice Hays Date of Encounter: 08/28/2023 [facility] Cardiologist: None   Interval Summary  .    Blood pressure this morning quite low.  Asymptomatic.  Vital Signs .    Vitals:   08/28/23 0309 08/28/23 0540 08/28/23 0833 08/28/23 0834  BP: 102/70  (!) 84/37 (!) 90/58  Pulse: 65  80 77  Resp: (!) 22 20 20 20   Temp: 98.7 F (37.1 C)  (!) 97.3 F (36.3 C)   TempSrc: Oral  Oral   SpO2: 95%  99% 100%  Weight: 79.4 kg     Height:        Intake/Output Summary (Last 24 hours) at 08/28/2023 1049 Last data filed at 08/28/2023 0845 Gross per 24 hour  Intake 440 ml  Output 2900 ml  Net -2460 ml      08/28/2023    3:09 AM 08/27/2023    4:00 AM 08/26/2023    4:12 AM  Last 3 Weights  Weight (lbs) 175 lb 0.7 oz 177 lb 11.2 oz 181 lb 7 oz  Weight (kg) 79.4 kg 80.604 kg 82.3 kg      Telemetry/ECG    No adverse arrhythmias- Personally Reviewed  Physical Exam .   GEN: No acute distress.   Neck: No JVD Cardiac: RRR, no murmurs, rubs, or gallops.  Respiratory: Clear to auscultation bilaterally. GI: Soft, nontender, non-distended  MS: No edema  Assessment & Plan .     42 year old female with acute systolic heart failure ejection fraction 40% with severe hypertensive urgency on arrival.  Reinitiation of several goal-directed medical therapy medications were performed.    - Blood pressure is quite low today. -Agree with holding the medications this morning. -I have adjusted all medications in half except for the Farxiga. -Lets see how her blood pressure adjust today before letting her go home.  For questions or updates, please contact [facility] Please consult [email or web address] for contact info under        Signed, Donato Schultz, MD

## 2023-08-28 NOTE — Progress Notes (Signed)
PROGRESS NOTE    Alice Hays  ZOX:[id number] DOB: [birth year] DOA: 08/24/2023 PCP: Pcp, No     Brief Narrative:  42 y.o. female with medical history significant of CHF/nonischemic cardiomyopathy, hypertension, history of multiple miscarriages/spontaneous abortions, anterior mediastinal mass, OSA on CPAP, hyperlipidemia presented to the ED with shortness of breath.  Patient initially given Solu-Medrol and bronchodilators but symptoms persisted therefore placed on BiPAP.  Initial blood pressure was also elevated.  Cardiology team consulted.  Home medications have been restarted, slowly blood pressure has been improving.     Assessment & Plan:  Principal Problem:   Acute on chronic heart failure with preserved ejection fraction (HFpEF) (HCC) Active Problems:   Hypertensive emergency   Acute hypoxemic respiratory failure (HCC)   Elevated troponin    Acute on chronic HFrEF, EF 45% Hypertensive emergency Acute hypoxemic respiratory failure Initially on BiPAP and nitroglycerin drip which has now been weaned off.  Continue diuretics, GDMT per cardiology team. Echocardiogram-EF 45%, known dilated cardiomyopathy Medication adjustments per cardiology, soft blood pressure therefore medications adjusted   Hypokalemia/hypocalcemia -Replete.     Elevated troponin Suspect demand ischemia.  Troponins are flat.   Essential hypertension - Medications as above.  IV as needed   Hyperlipidemia - Statin   Anemia of chronic disease - Iron supplements.   History of multiple miscarriages - Workup in the past has been negative       DVT prophylaxis: enoxaparin (LOVENOX) injection 40 mg Start: 08/25/23 1000 Code Status: Full Family Communication:   Status is: Inpatient Remains inpatient appropriate because: Severe SOB, cont hosp stay Will discharge once cleared by cardiology     Subjective: Feeling better no new complaints. Overall remains asymptomatic but has soft blood  pressures this morning   Examination:   General exam: Appears calm and comfortable  Respiratory system: Clear to auscultation. Respiratory effort normal. Cardiovascular system: S1 & S2 heard, RRR. No JVD, murmurs, rubs, gallops or clicks. No pedal edema. Gastrointestinal system: Abdomen is nondistended, soft and nontender. No organomegaly or masses felt. Normal bowel sounds heard. Central nervous system: Alert and oriented. No focal neurological deficits. Extremities: Symmetric 5 x 5 power. Skin: No rashes, lesions or ulcers Psychiatry: Judgement and insight appear normal. Mood & affect appropriate.          Diet Orders (From admission, onward)     Start     Ordered   08/25/23 1014  Diet Heart Room service appropriate? Yes; Fluid consistency: Thin; Fluid restriction: 1500 mL Fluid  Diet effective now       Question Answer Comment  Room service appropriate? Yes   Fluid consistency: Thin   Fluid restriction: 1500 mL Fluid      08/25/23 1013            Objective: Vitals:   08/28/23 0540 08/28/23 0833 08/28/23 0834 08/28/23 1133  BP:  (!) 84/37 (!) 90/58 117/64  Pulse:  80 77 64  Resp: 20 20 20 17   Temp:  (!) 97.3 F (36.3 C)  98.8 F (37.1 C)  TempSrc:  Oral  Oral  SpO2:  99% 100% 97%  Weight:      Height:        Intake/Output Summary (Last 24 hours) at 08/28/2023 1237 Last data filed at 08/28/2023 0845 Gross per 24 hour  Intake 440 ml  Output 2900 ml  Net -2460 ml   Filed Weights   08/26/23 0412 08/27/23 0400 08/28/23 0309  Weight: 82.3 kg 80.6 kg 79.4 kg  Scheduled Meds:  atorvastatin  80 mg Oral Daily   carvedilol  12.5 mg Oral BID WC   dapagliflozin propanediol  10 mg Oral QAC breakfast   enoxaparin (LOVENOX) injection  40 mg Subcutaneous Q24H   ferrous sulfate  325 mg Oral QODAY   sacubitril-valsartan  1 tablet Oral BID   [START ON 08/29/2023] spironolactone  12.5 mg Oral Daily   Continuous Infusions:  Nutritional status     Body mass  index is 36.58 kg/m.  Data Reviewed:   CBC: Recent Labs  Lab 08/24/23 2217 08/24/23 2224 08/25/23 0831  WBC 8.3  --  8.7  NEUTROABS 3.8  --   --   HGB 13.1 14.6 12.3  HCT 42.5 43.0 37.0  MCV 88.7  --  85.3  PLT 329  --  287   Basic Metabolic Panel: Recent Labs  Lab 08/24/23 2231 08/25/23 0322 08/25/23 0831 08/26/23 0359 08/27/23 0352 08/28/23 0403  NA 136 137  --  137 136 133*  K 3.7 2.9*  --  3.8 3.7 3.4*  CL 101 99  --  103 100 100  CO2 22 24  --  26 26 26   GLUCOSE 114* 173*  --  128* 101* 104*  BUN 17 16  --  15 18 21*  CREATININE 1.08* 0.99  --  1.04* 1.10* 1.15*  CALCIUM 8.5* 8.6*  --  8.5* 8.8* 8.6*  MG  --   --  1.7 1.8 1.9 2.1  PHOS  --   --  3.6 4.0  --   --    GFR: Estimated Creatinine Clearance: 57.8 mL/min (A) (by C-G formula based on SCr of 1.15 mg/dL (H)). Liver Function Tests: Recent Labs  Lab 08/24/23 2231  AST 35  ALT 36  ALKPHOS 66  BILITOT 0.7  PROT 6.1*  ALBUMIN 3.1*   No results for input(s): "LIPASE", "AMYLASE" in the last 168 hours. No results for input(s): "AMMONIA" in the last 168 hours. Coagulation Profile: No results for input(s): "INR", "PROTIME" in the last 168 hours. Cardiac Enzymes: No results for input(s): "CKTOTAL", "CKMB", "CKMBINDEX", "TROPONINI" in the last 168 hours. BNP (last 3 results) No results for input(s): "PROBNP" in the last 8760 hours. HbA1C: No results for input(s): "HGBA1C" in the last 72 hours. CBG: No results for input(s): "GLUCAP" in the last 168 hours. Lipid Profile: No results for input(s): "CHOL", "HDL", "LDLCALC", "TRIG", "CHOLHDL", "LDLDIRECT" in the last 72 hours. Thyroid Function Tests: No results for input(s): "TSH", "T4TOTAL", "FREET4", "T3FREE", "THYROIDAB" in the last 72 hours. Anemia Panel: No results for input(s): "VITAMINB12", "FOLATE", "FERRITIN", "TIBC", "IRON", "RETICCTPCT" in the last 72 hours. Sepsis Labs: No results for input(s): "PROCALCITON", "LATICACIDVEN" in the last 168  hours.  Recent Results (from the past 240 hour(s))  Resp panel by RT-PCR (RSV, Flu A&B, Covid) Anterior Nasal Swab     Status: None   Collection Time: 08/18/23  8:42 PM   Specimen: Anterior Nasal Swab  Result Value Ref Range Status   SARS Coronavirus 2 by RT PCR NEGATIVE NEGATIVE Final   Influenza A by PCR NEGATIVE NEGATIVE Final   Influenza B by PCR NEGATIVE NEGATIVE Final    Comment: (NOTE) The Xpert Xpress SARS-CoV-2/FLU/RSV plus assay is intended as an aid in the diagnosis of influenza from Nasopharyngeal swab specimens and should not be used as a sole basis for treatment. Nasal washings and aspirates are unacceptable for Xpert Xpress SARS-CoV-2/FLU/RSV testing.  Fact Sheet for Patients: BloggerCourse.com  Fact Sheet for Healthcare  Providers: SeriousBroker.it  This test is not yet approved or cleared by the [place] and has been authorized for detection and/or diagnosis of SARS-CoV-2 by FDA under an Emergency Use Authorization (EUA). This EUA will remain in effect (meaning this test can be used) for the duration of the COVID-19 declaration under Section 564(b)(1) of the Act, 21 U.S.C. section 360bbb-3(b)(1), unless the authorization is terminated or revoked.     Resp Syncytial Virus by PCR NEGATIVE NEGATIVE Final    Comment: (NOTE) Fact Sheet for Patients: BloggerCourse.com  Fact Sheet for Healthcare Providers: SeriousBroker.it  This test is not yet approved or cleared by the [place] FDA and has been authorized for detection and/or diagnosis of SARS-CoV-2 by FDA under an Emergency Use Authorization (EUA). This EUA will remain in effect (meaning this test can be used) for the duration of the COVID-19 declaration under Section 564(b)(1) of the Act, 21 U.S.C. section 360bbb-3(b)(1), unless the authorization is terminated or revoked.  Performed at [facility], [address] [place], [place] [address]   Resp panel by RT-PCR (RSV, Flu A&B, Covid) Anterior Nasal Swab     Status: None   Collection Time: 08/24/23 10:32 PM   Specimen: Anterior Nasal Swab  Result Value Ref Range Status   SARS Coronavirus 2 by RT PCR NEGATIVE NEGATIVE Final   Influenza A by PCR NEGATIVE NEGATIVE Final   Influenza B by PCR NEGATIVE NEGATIVE Final    Comment: (NOTE) The Xpert Xpress SARS-CoV-2/FLU/RSV plus assay is intended as an aid in the diagnosis of influenza from Nasopharyngeal swab specimens and should not be used as a sole basis for treatment. Nasal washings and aspirates are unacceptable for Xpert Xpress SARS-CoV-2/FLU/RSV testing.  Fact Sheet for Patients: BloggerCourse.com  Fact Sheet for Healthcare Providers: SeriousBroker.it  This test is not yet approved or cleared by the [place] FDA and has been authorized for detection and/or diagnosis of SARS-CoV-2 by FDA under an Emergency Use Authorization (EUA). This EUA will remain in effect (meaning this test can be used) for the duration of the COVID-19 declaration under Section 564(b)(1) of the Act, 21 U.S.C. section 360bbb-3(b)(1), unless the authorization is terminated or revoked.     Resp Syncytial Virus by PCR NEGATIVE NEGATIVE Final    Comment: (NOTE) Fact Sheet for Patients: BloggerCourse.com  Fact Sheet for Healthcare Providers: SeriousBroker.it  This test is not yet approved or cleared by the [place] FDA and has been authorized for detection and/or diagnosis of SARS-CoV-2 by FDA under an Emergency Use Authorization (EUA). This EUA will remain in effect (meaning this test can be used) for the duration of the COVID-19 declaration under Section 564(b)(1) of the Act, 21 U.S.C. section 360bbb-3(b)(1), unless the authorization is terminated or revoked.  Performed at [facility], [address], [place], [place] [address]          Radiology Studies: No results found.         LOS: 4 days   Time spent= 35 mins    Miguel Rota, MD [facility]  If 7PM-7AM, please contact night-coverage  08/28/2023, 12:37 PM

## 2023-08-29 ENCOUNTER — Other Ambulatory Visit (HOSPITAL_COMMUNITY): Payer: Self-pay

## 2023-08-29 DIAGNOSIS — I5033 Acute on chronic diastolic (congestive) heart failure: Secondary | ICD-10-CM | POA: Diagnosis not present

## 2023-08-29 LAB — BASIC METABOLIC PANEL
Anion gap: 8 (ref 5–15)
BUN: 16 mg/dL (ref 6–20)
CO2: 24 mmol/L (ref 22–32)
Calcium: 8.6 mg/dL — ABNORMAL LOW (ref 8.9–10.3)
Chloride: 104 mmol/L (ref 98–111)
Creatinine, Ser: 1.06 mg/dL — ABNORMAL HIGH (ref 0.44–1.00)
GFR, Estimated: >60 mL/min (ref >60)
Glucose, Bld: 98 mg/dL (ref 70–99)
Potassium: 4 mmol/L (ref 3.5–5.1)
Sodium: 136 mmol/L (ref 135–145)

## 2023-08-29 LAB — MAGNESIUM: Magnesium: 2.3 mg/dL (ref 1.7–2.4)

## 2023-08-29 MED ORDER — SACUBITRIL-VALSARTAN 49-51 MG PO TABS
1.0000 | ORAL_TABLET | Freq: Two times a day (BID) | ORAL | 0 refills | Status: DC
  Filled 2023-08-29: qty 60, 30d supply, fill #0

## 2023-08-29 MED ORDER — DAPAGLIFLOZIN PROPANEDIOL 10 MG PO TABS
10.0000 mg | ORAL_TABLET | Freq: Every day | ORAL | 0 refills | Status: DC
  Filled 2023-08-29: qty 30, 30d supply, fill #0

## 2023-08-29 MED ORDER — SPIRONOLACTONE 25 MG PO TABS
12.5000 mg | ORAL_TABLET | Freq: Every day | ORAL | 0 refills | Status: DC
  Filled 2023-08-29: qty 15, 30d supply, fill #0

## 2023-08-29 MED ORDER — CARVEDILOL 12.5 MG PO TABS
12.5000 mg | ORAL_TABLET | Freq: Two times a day (BID) | ORAL | 0 refills | Status: DC
  Filled 2023-08-29: qty 60, 30d supply, fill #0

## 2023-08-29 NOTE — TOC Transition Note (Signed)
Transition of Care [facility]) - CM/SW Discharge Note   Patient Details  Name: Alice Hays MRN: [id number] Date of Birth: October 22, 1981  [facility] CM/SW Contact:  Leone Haven, RN Phone Number: 08/29/2023, 10:20 AM   Clinical Narrative:    For dc today, she has transportation, NCM gave her the 10.00 copay entresto coupon.  She has already used the free 30 day trial coupon.      Barriers to Discharge: Continued Medical Work up   Patient Goals and CMS Choice      Discharge Placement                         Discharge Plan and Services Additional resources added to the After Visit Summary for   In-house Referral: NA Discharge Planning Services: CM Consult            DME Arranged: N/A DME Agency: NA                  Social Determinants of Health (SDOH) Interventions SDOH Screenings   Depression (PHQ2-9): Low Risk  (12/07/2021)  Tobacco Use: Low Risk  (08/25/2023)     Readmission Risk Interventions     No data to display

## 2023-08-29 NOTE — Progress Notes (Signed)
Pt has received AVS discharge teaching with IV removed, Volunteers transporting to [facility] pharmacy and Ride is waiting at Entrance C.

## 2023-08-29 NOTE — Discharge Summary (Signed)
Physician Discharge Summary  Alice Hays AVW:[id number] DOB: 12-Sep-1981 DOA: 08/24/2023  PCP: Pcp, No  Admit date: 08/24/2023 Discharge date: 08/29/2023  Admitted From: Home Disposition: Home  Recommendations for Outpatient Follow-up:  Follow up with PCP in 1-2 weeks Please obtain BMP/CBC in one week your next doctors visit.  Cardiac medications as below.  Doing well no complaints.  Cardiology follow-up to be arranged by their service   Discharge Condition: Stable CODE STATUS: Full code Diet recommendation: Heart healthy    Brief Narrative:  42 y.o. female with medical history significant of CHF/nonischemic cardiomyopathy, hypertension, history of multiple miscarriages/spontaneous abortions, anterior mediastinal mass, OSA on CPAP, hyperlipidemia presented to the ED with shortness of breath.  Patient initially given Solu-Medrol and bronchodilators but symptoms persisted therefore placed on BiPAP.  Initial blood pressure was also elevated.  Cardiology team consulted.  Home medications have been restarted, slowly blood pressure has been improving.  Echocardiogram showed EF 45%.  GDMT per cardiology, patient doing much better.  Will discharge her in stable condition with outpatient follow-up.     Assessment & Plan:  Principal Problem:   Acute on chronic heart failure with preserved ejection fraction (HFpEF) (HCC) Active Problems:   Hypertensive emergency   Acute hypoxemic respiratory failure (HCC)   Elevated troponin    Acute on chronic HFrEF, EF 45% Hypertensive emergency Acute hypoxemic respiratory failure Initially on BiPAP and nitroglycerin drip which has now been weaned off.  Continue diuretics, GDMT per cardiology team. Echocardiogram-EF 45%, known dilated cardiomyopathy Cardiac medications upon discharge-Coreg, Entresto, Aldactone, Farxiga   Hypokalemia/hypocalcemia -Replete.     Elevated troponin Suspect demand ischemia.  Troponins are flat.   Essential  hypertension - Medications as above.  IV as needed   Hyperlipidemia - Statin   Anemia of chronic disease - Iron supplements.   History of multiple miscarriages - Workup in the past has been negative       DVT prophylaxis: enoxaparin  Code Status: Full Family Communication:   Status is: Inpatient Hopefully discharge today once cleared by cardiology     Subjective: Medically doing well.  Wants to go home.   Examination:   General exam: Appears calm and comfortable  Respiratory system: Clear to auscultation. Respiratory effort normal. Cardiovascular system: S1 & S2 heard, RRR. No JVD, murmurs, rubs, gallops or clicks. No pedal edema. Gastrointestinal system: Abdomen is nondistended, soft and nontender. No organomegaly or masses felt. Normal bowel sounds heard. Central nervous system: Alert and oriented. No focal neurological deficits. Extremities: Symmetric 5 x 5 power. Skin: No rashes, lesions or ulcers Psychiatry: Judgement and insight appear normal. Mood & affect appropriate.     Discharge Diagnoses:  Principal Problem:   Acute on chronic heart failure with preserved ejection fraction (HFpEF) (HCC) Active Problems:   Hypertensive emergency   Acute hypoxemic respiratory failure (HCC)   Elevated troponin   Hypertensive urgency   Acute on chronic heart failure [facility])     Discharge Exam: Vitals:   08/29/23 0618 08/29/23 0829  BP: 117/77 114/75  Pulse: 75 75  Resp: 18 (!) 22  Temp: 98.1 F (36.7 C) 98.4 F (36.9 C)  SpO2: 96% 95%   Vitals:   08/28/23 1914 08/29/23 0100 08/29/23 0618 08/29/23 0829  BP: (!) 104/55 122/87 117/77 114/75  Pulse:  77 75 75  Resp: 15 18 18  (!) 22  Temp: 98.8 F (37.1 C) 98 F (36.7 C) 98.1 F (36.7 C) 98.4 F (36.9 C)  TempSrc: Oral Oral Oral Oral  SpO2: 98% 99% 96% 95%  Weight:   80.8 kg   Height:         Discharge Instructions   Allergies as of 08/29/2023       Reactions   Shellfish Allergy Anaphylaxis         Medication List     STOP taking these medications    amLODipine 10 MG tablet Commonly known as: NORVASC   Entresto 97-103 MG Generic drug: sacubitril-valsartan Replaced by: Entresto 49-51 MG   isosorbide-hydrALAZINE 20-37.5 MG tablet Commonly known as: BiDil       TAKE these medications    atorvastatin 80 MG tablet Commonly known as: LIPITOR Take 1 tablet (80 mg total) by mouth daily.   carvedilol 12.5 MG tablet Commonly known as: COREG Take 1 tablet (12.5 mg total) by mouth 2 (two) times daily with a meal. What changed:  medication strength how much to take Notes to patient: Take for your Heart   Entresto 49-51 MG Generic drug: sacubitril-valsartan Take 1 tablet by mouth 2 (two) times daily. Replaces: Entresto 97-103 MG Notes to patient: Take for blood pressure and Heart   Farxiga 10 MG Tabs tablet Generic drug: dapagliflozin propanediol Take 1 tablet (10 mg total) by mouth daily before breakfast. Start taking on: August 30, 2023   FeroSul 325 (65 Fe) MG tablet Generic drug: ferrous sulfate Take 1 tablet (325 mg total) by mouth every other day.   guaiFENesin-codeine 100-10 MG/5ML syrup Take 5 mLs by mouth 3 (three) times daily as needed for cough.   levalbuterol 45 MCG/ACT inhaler Commonly known as: XOPENEX HFA Inhale 1-2 puffs into the lungs every 6 (six) hours as needed for wheezing.   MV-ONE PO Take 1 capsule by mouth every morning.   spironolactone 25 MG tablet Commonly known as: ALDACTONE Take 0.5 tablets (12.5 mg total) by mouth daily. Start taking on: August 30, 2023 What changed: how much to take        Follow-up Information     [facility] Follow up on 09/04/2023.   Specialty: Internal Medicine Why: 11:20 to establish new patient care and follow up Contact information: [address] [place] [address] [phone]               Allergies  Allergen Reactions   Shellfish Allergy  Anaphylaxis    You were cared for by a hospitalist during your hospital stay. If you have any questions about your discharge medications or the care you received while you were in the hospital after you are discharged, you can call the unit and asked to speak with the hospitalist on call if the hospitalist that took care of you is not available. Once you are discharged, your primary care physician will handle any further medical issues. Please note that no refills for any discharge medications will be authorized once you are discharged, as it is imperative that you return to your primary care physician (or establish a relationship with a primary care physician if you do not have one) for your aftercare needs so that they can reassess your need for medications and monitor your lab values.  You were cared for by a hospitalist during your hospital stay. If you have any questions about your discharge medications or the care you received while you were in the hospital after you are discharged, you can call the unit and asked to speak with the hospitalist on call if the hospitalist that took care of you  is not available. Once you are discharged, your primary care physician will handle any further medical issues. Please note that NO REFILLS for any discharge medications will be authorized once you are discharged, as it is imperative that you return to your primary care physician (or establish a relationship with a primary care physician if you do not have one) for your aftercare needs so that they can reassess your need for medications and monitor your lab values.  Please request your Prim.MD to go over all Hospital Tests and Procedure/Radiological results at the follow up, please get all Hospital records sent to your Prim MD by signing hospital release before you go home.  Get CBC, CMP, 2 view Chest X ray checked  by Primary MD during your next visit or SNF MD in 5-7 days ( we routinely change or add medications  that can affect your baseline labs and fluid status, therefore we recommend that you get the mentioned basic workup next visit with your PCP, your PCP may decide not to get them or add new tests based on their clinical decision)  On your next visit with your primary care physician please Get Medicines reviewed and adjusted.  If you experience worsening of your admission symptoms, develop shortness of breath, life threatening emergency, suicidal or homicidal thoughts you must seek medical attention immediately by calling 911 or calling your MD immediately  if symptoms less severe.  You Must read complete instructions/literature along with all the possible adverse reactions/side effects for all the Medicines you take and that have been prescribed to you. Take any new Medicines after you have completely understood and accpet all the possible adverse reactions/side effects.   Do not drive, operate heavy machinery, perform activities at heights, swimming or participation in water activities or provide baby sitting services if your were admitted for syncope or siezures until you have seen by Primary MD or a Neurologist and advised to do so again.  Do not drive when taking Pain medications.   Procedures/Studies: ECHOCARDIOGRAM COMPLETE  Result Date: 08/25/2023    ECHOCARDIOGRAM REPORT   Patient Name:   JEWELIA BOCCHINO Date of Exam: 08/25/2023 Medical Rec #:  [id number]          Height:       58.0 in Accession #:    [id number]         Weight:       175.0 lb Date of Birth:  June 19, 1981          BSA:          1.721 m Patient Age:    41 years           BP:           158/99 mmHg Patient Gender: F                  HR:           91 bpm. Exam Location:  Inpatient Procedure: 2D Echo, Color Doppler and Cardiac Doppler Indications:    I50.31 Acute diastolic (congestive) heart failure  History:        Patient has prior history of Echocardiogram examinations, most                 recent 01/17/2022. Risk  Factors:Hypertension and Sleep Apnea.  Sonographer:    Irving Burton Senior RDCS Referring Phys: [id number] VASUNDHRA RATHORE IMPRESSIONS  1. Left ventricular ejection fraction, by estimation, is 40 to 45%. The left ventricle has mildly decreased function. The  left ventricle demonstrates global hypokinesis. There is mild asymmetric left ventricular hypertrophy of the lateral segment. Indeterminate diastolic filling due to E-A fusion.  2. Right ventricular systolic function is low normal. The right ventricular size is normal. Tricuspid regurgitation signal is inadequate for assessing PA pressure.  3. Left atrial size was severely dilated.  4. Right atrial size was severely dilated.  5. The mitral valve is normal in structure. Moderate mitral valve regurgitation.  6. The aortic valve is normal in structure. Aortic valve regurgitation is trivial.  7. The inferior vena cava is normal in size with <50% respiratory variability, suggesting right atrial pressure of 8 mmHg. FINDINGS  Left Ventricle: Left ventricular ejection fraction, by estimation, is 40 to 45%. The left ventricle has mildly decreased function. The left ventricle demonstrates global hypokinesis. The left ventricular internal cavity size was normal in size. There is  mild asymmetric left ventricular hypertrophy of the lateral segment. Indeterminate diastolic filling due to E-A fusion. Right Ventricle: The right ventricular size is normal. No increase in right ventricular wall thickness. Right ventricular systolic function is low normal. Tricuspid regurgitation signal is inadequate for assessing PA pressure. Left Atrium: Left atrial size was severely dilated. Right Atrium: Right atrial size was severely dilated. Pericardium: There is no evidence of pericardial effusion. Mitral Valve: The mitral valve is normal in structure. Moderate mitral valve regurgitation. Tricuspid Valve: The tricuspid valve is normal in structure. Tricuspid valve regurgitation is trivial. Aortic  Valve: The aortic valve is normal in structure. Aortic valve regurgitation is trivial. Pulmonic Valve: The pulmonic valve was normal in structure. Pulmonic valve regurgitation is trivial. Aorta: The aortic root and ascending aorta are structurally normal, with no evidence of dilitation. Venous: The inferior vena cava is normal in size with less than 50% respiratory variability, suggesting right atrial pressure of 8 mmHg. IAS/Shunts: No atrial level shunt detected by color flow Doppler.  LEFT VENTRICLE PLAX 2D LVIDd:         5.70 cm LVIDs:         4.40 cm LV PW:         1.10 cm LV IVS:        0.80 cm LVOT diam:     2.20 cm LV SV:         68 LV SV Index:   40 LVOT Area:     3.80 cm  LV Volumes (MOD) LV vol d, MOD A2C: 168.0 ml LV vol d, MOD A4C: 167.0 ml LV vol s, MOD A2C: 107.0 ml LV vol s, MOD A4C: 97.3 ml LV SV MOD A2C:     61.0 ml LV SV MOD A4C:     167.0 ml LV SV MOD BP:      66.8 ml RIGHT VENTRICLE RV S prime:     14.90 cm/s TAPSE (M-mode): 2.7 cm LEFT ATRIUM             Index        RIGHT ATRIUM           Index LA diam:        3.90 cm 2.27 cm/m   RA Area:     24.80 cm LA Vol (A2C):   80.0 ml 46.49 ml/m  RA Volume:   75.60 ml  43.94 ml/m LA Vol (A4C):   81.6 ml 47.42 ml/m LA Biplane Vol: 80.7 ml 46.90 ml/m  AORTIC VALVE LVOT Vmax:   99.30 cm/s LVOT Vmean:  78.700 cm/s LVOT VTI:    0.179 m  AORTA Ao Root diam: 2.90 cm Ao Asc diam:  3.20 cm MR Peak grad:    124.1 mmHg MR Mean grad:    83.0 mmHg    SHUNTS MR Vmax:         557.00 cm/s  Systemic VTI:  0.18 m MR Vmean:        434.0 cm/s   Systemic Diam: 2.20 cm MR PISA:         1.57 cm MR PISA Eff ROA: 11 mm MR PISA Radius:  0.50 cm Aditya Sabharwal Electronically signed by Dorthula Nettles Signature Date/Time: 08/25/2023/3:38:51 PM    Final    DG Chest Port 1 View  Result Date: 08/24/2023 CLINICAL DATA:  Shortness of breath EXAM: PORTABLE CHEST 1 VIEW COMPARISON:  08/18/2023 FINDINGS: Cardiomegaly with vascular congestion and increased interstitial  opacities suspicious for edema. No pleural effusion. No consolidation or pneumothorax. IMPRESSION: Cardiomegaly with vascular congestion and increased interstitial opacities suspicious for edema. Electronically Signed   By: Jasmine Pang M.D.   On: 08/24/2023 22:32   DG Chest 2 View  Result Date: 08/18/2023 CLINICAL DATA:  Congestion wheezing EXAM: CHEST - 2 VIEW COMPARISON:  01/08/2023, CT 11/28/2021 FINDINGS: Cardiomegaly. No pleural effusion. Airways thickening in the perihilar regions. Bilateral hilar fullness potentially due to vascularity but difficult to exclude nodes. No pneumothorax IMPRESSION: 1. Cardiomegaly. 2. Airways thickening in the perihilar regions, question bronchitis. 3. Bilateral hilar fullness potentially due to vascularity but difficult to exclude adenopathy Electronically Signed   By: Jasmine Pang M.D.   On: 08/18/2023 22:51     The results of significant diagnostics from this hospitalization (including imaging, microbiology, ancillary and laboratory) are listed below for reference.     Microbiology: Recent Results (from the past 240 hour(s))  Resp panel by RT-PCR (RSV, Flu A&B, Covid) Anterior Nasal Swab     Status: None   Collection Time: 08/24/23 10:32 PM   Specimen: Anterior Nasal Swab  Result Value Ref Range Status   SARS Coronavirus 2 by RT PCR NEGATIVE NEGATIVE Final   Influenza A by PCR NEGATIVE NEGATIVE Final   Influenza B by PCR NEGATIVE NEGATIVE Final    Comment: (NOTE) The Xpert Xpress SARS-CoV-2/FLU/RSV plus assay is intended as an aid in the diagnosis of influenza from Nasopharyngeal swab specimens and should not be used as a sole basis for treatment. Nasal washings and aspirates are unacceptable for Xpert Xpress SARS-CoV-2/FLU/RSV testing.  Fact Sheet for Patients: BloggerCourse.com  Fact Sheet for Healthcare Providers: SeriousBroker.it  This test is not yet approved or cleared by the [place] FDA and has been authorized for detection and/or diagnosis of SARS-CoV-2 by FDA under an Emergency Use Authorization (EUA). This EUA will remain in effect (meaning this test can be used) for the duration of the COVID-19 declaration under Section 564(b)(1) of the Act, 21 U.S.C. section 360bbb-3(b)(1), unless the authorization is terminated or revoked.     Resp Syncytial Virus by PCR NEGATIVE NEGATIVE Final    Comment: (NOTE) Fact Sheet for Patients: BloggerCourse.com  Fact Sheet for Healthcare Providers: SeriousBroker.it  This test is not yet approved or cleared by the [place] FDA and has been authorized for detection and/or diagnosis of SARS-CoV-2 by FDA under an Emergency Use Authorization (EUA). This EUA will remain in effect (meaning this test can be used) for the duration of the COVID-19 declaration under Section 564(b)(1) of the Act, 21 U.S.C. section 360bbb-3(b)(1), unless the authorization is terminated or revoked.  Performed at [facility]  Lab, [address], [place], [place] [address]      Labs: BNP (last 3 results) Recent Labs    01/08/23 2015 08/24/23 2217  BNP 415.4* 2,536.7*   Basic Metabolic Panel: Recent Labs  Lab 08/25/23 0322 08/25/23 0831 08/26/23 0359 08/27/23 0352 08/28/23 0403 08/29/23 0444  NA 137  --  137 136 133* 136  K 2.9*  --  3.8 3.7 3.4* 4.0  CL 99  --  103 100 100 104  CO2 24  --  26 26 26 24   GLUCOSE 173*  --  128* 101* 104* 98  BUN 16  --  15 18 21* 16  CREATININE 0.99  --  1.04* 1.10* 1.15* 1.06*  CALCIUM 8.6*  --  8.5* 8.8* 8.6* 8.6*  MG  --  1.7 1.8 1.9 2.1 2.3  PHOS  --  3.6 4.0  --   --   --    Liver Function Tests: Recent Labs  Lab 08/24/23 2231  AST 35  ALT 36  ALKPHOS 66  BILITOT 0.7  PROT 6.1*  ALBUMIN 3.1*   No results for input(s): "LIPASE", "AMYLASE" in the last 168 hours. No results for input(s): "AMMONIA" in the last 168  hours. CBC: Recent Labs  Lab 08/24/23 2217 08/24/23 2224 08/25/23 0831 08/28/23 1215  WBC 8.3  --  8.7 6.5  NEUTROABS 3.8  --   --   --   HGB 13.1 14.6 12.3 14.6  HCT 42.5 43.0 37.0 46.5*  MCV 88.7  --  85.3 87.1  PLT 329  --  287 316   Cardiac Enzymes: No results for input(s): "CKTOTAL", "CKMB", "CKMBINDEX", "TROPONINI" in the last 168 hours. BNP: Invalid input(s): "POCBNP" CBG: No results for input(s): "GLUCAP" in the last 168 hours. D-Dimer No results for input(s): "DDIMER" in the last 72 hours. Hgb A1c No results for input(s): "HGBA1C" in the last 72 hours. Lipid Profile No results for input(s): "CHOL", "HDL", "LDLCALC", "TRIG", "CHOLHDL", "LDLDIRECT" in the last 72 hours. Thyroid function studies No results for input(s): "TSH", "T4TOTAL", "T3FREE", "THYROIDAB" in the last 72 hours.  Invalid input(s): "FREET3" Anemia work up No results for input(s): "VITAMINB12", "FOLATE", "FERRITIN", "TIBC", "IRON", "RETICCTPCT" in the last 72 hours. Urinalysis    Component Value Date/Time   COLORURINE YELLOW 11/29/2021 0715   APPEARANCEUR CLEAR 11/29/2021 0715   LABSPEC 1.015 11/29/2021 0715   PHURINE 8.0 11/29/2021 0715   GLUCOSEU NEGATIVE 11/29/2021 0715   HGBUR NEGATIVE 11/29/2021 0715   BILIRUBINUR NEGATIVE 11/29/2021 0715   KETONESUR NEGATIVE 11/29/2021 0715   PROTEINUR NEGATIVE 11/29/2021 0715   UROBILINOGEN 0.2 02/17/2014 2212   NITRITE NEGATIVE 11/29/2021 0715   LEUKOCYTESUR NEGATIVE 11/29/2021 0715   Sepsis Labs Recent Labs  Lab 08/24/23 2217 08/25/23 0831 08/28/23 1215  WBC 8.3 8.7 6.5   Microbiology Recent Results (from the past 240 hour(s))  Resp panel by RT-PCR (RSV, Flu A&B, Covid) Anterior Nasal Swab     Status: None   Collection Time: 08/24/23 10:32 PM   Specimen: Anterior Nasal Swab  Result Value Ref Range Status   SARS Coronavirus 2 by RT PCR NEGATIVE NEGATIVE Final   Influenza A by PCR NEGATIVE NEGATIVE Final   Influenza B by PCR NEGATIVE  NEGATIVE Final    Comment: (NOTE) The Xpert Xpress SARS-CoV-2/FLU/RSV plus assay is intended as an aid in the diagnosis of influenza from Nasopharyngeal swab specimens and should not be used as a sole basis for treatment. Nasal washings and aspirates are unacceptable for  Xpert Xpress SARS-CoV-2/FLU/RSV testing.  Fact Sheet for Patients: BloggerCourse.com  Fact Sheet for Healthcare Providers: SeriousBroker.it  This test is not yet approved or cleared by the [place] FDA and has been authorized for detection and/or diagnosis of SARS-CoV-2 by FDA under an Emergency Use Authorization (EUA). This EUA will remain in effect (meaning this test can be used) for the duration of the COVID-19 declaration under Section 564(b)(1) of the Act, 21 U.S.C. section 360bbb-3(b)(1), unless the authorization is terminated or revoked.     Resp Syncytial Virus by PCR NEGATIVE NEGATIVE Final    Comment: (NOTE) Fact Sheet for Patients: BloggerCourse.com  Fact Sheet for Healthcare Providers: SeriousBroker.it  This test is not yet approved or cleared by the [place] FDA and has been authorized for detection and/or diagnosis of SARS-CoV-2 by FDA under an Emergency Use Authorization (EUA). This EUA will remain in effect (meaning this test can be used) for the duration of the COVID-19 declaration under Section 564(b)(1) of the Act, 21 U.S.C. section 360bbb-3(b)(1), unless the authorization is terminated or revoked.  Performed at [facility], [address], [place], [place] [address]      Time coordinating discharge:  I have spent 35 minutes face to face with the patient and on the ward discussing the patients care, assessment, plan and disposition with other care givers. >50% of the time was devoted counseling the patient about the risks and benefits of treatment/Discharge disposition and  coordinating care.   SIGNED:   Miguel Rota, MD  [facility] 08/29/2023, 12:29 PM   If 7PM-7AM, please contact night-coverage

## 2023-08-29 NOTE — Progress Notes (Signed)
   Patient Name: OLUWADAMILOLA DELIZ Date of Encounter: 08/29/2023 [facility] Cardiologist: None   Interval Summary  .   Comfortable Works at [employer] on [address]  Vital Signs .    Vitals:   08/28/23 1621 08/28/23 1914 08/29/23 0100 08/29/23 0618  BP: 103/81 (!) 104/55 122/87 117/77  Pulse: 74  77 75  Resp: 16 15 18 18   Temp: 98.8 F (37.1 C) 98.8 F (37.1 C) 98 F (36.7 C) 98.1 F (36.7 C)  TempSrc: Oral Oral Oral Oral  SpO2: 100% 98% 99% 96%  Weight:    80.8 kg  Height:        Intake/Output Summary (Last 24 hours) at 08/29/2023 0827 Last data filed at 08/29/2023 0500 Gross per 24 hour  Intake 540 ml  Output 1850 ml  Net -1310 ml      08/29/2023    6:18 AM 08/28/2023    3:09 AM 08/27/2023    4:00 AM  Last 3 Weights  Weight (lbs) 178 lb 2.1 oz 175 lb 0.7 oz 177 lb 11.2 oz  Weight (kg) 80.8 kg 79.4 kg 80.604 kg      Telemetry/ECG    Isolated PVC, sinus rhythm- Personally Reviewed  Physical Exam .   Sleeping comfortably in no acute distress, normal respiratory effort  Assessment & Plan .     42 year old female with acute on chronic systolic heart failure ejection fraction 40% possible hypertension related  Acute systolic heart failure Transient hypotension - Yesterday developed hypotension, perhaps iatrogenic from goal-directed medical therapy. - I stopped BiDil and amlodipine and cut carvedilol in half, spironolactone in half, Entresto in half.  Marcelline Deist was continued at 10 mg. -It appears that she is tolerating this well and her blood pressure is quite reasonable. -I think it would be reasonable to discharge her on current regimen with close follow-up and T[facility]. -Goal would be to repeat echocardiogram in approximately 3 months on goal-directed medical therapy and hope for improvement in ejection fraction.  If not, consider further ischemic evaluation.  Okay for discharge  For questions or updates, please contact [facility] Please consult [email or web address] for contact info under        Signed, Donato Schultz, MD

## 2023-09-04 ENCOUNTER — Encounter: Payer: Self-pay | Admitting: Nurse Practitioner

## 2023-09-04 ENCOUNTER — Telehealth: Payer: BC Managed Care – PPO | Admitting: Nurse Practitioner

## 2023-09-04 VITALS — Ht <= 58 in | Wt 178.0 lb

## 2023-09-04 DIAGNOSIS — I509 Heart failure, unspecified: Secondary | ICD-10-CM | POA: Diagnosis not present

## 2023-09-04 DIAGNOSIS — I10 Essential (primary) hypertension: Secondary | ICD-10-CM

## 2023-09-04 NOTE — Progress Notes (Signed)
Virtual Visit via Telephone Note  I connected with Alice Hays on 09/04/23 at 11:20 AM EST by telephone and verified that I am speaking with the correct person using two identifiers.  Location: Patient: home Provider: office   I discussed the limitations, risks, security and privacy concerns of performing an evaluation and management service by telephone and the availability of in person appointments. I also discussed with the patient that there may be a patient responsible charge related to this service. The patient expressed understanding and agreed to proceed.   History of Present Illness:  Patient presents today through video visit for hospital follow-up.  She was admitted to the hospital on 08/24/2023 for hypertensive urgency.  Patient has been doing well since hospital discharge.  She was able to pick up all of her medications at discharge and is compliant with medications.  She does have a follow-up appointment scheduled with cardiology for next week.  She has not been checking blood pressures at home but does intend to pick up a blood pressure cuff today.  She has been checking her weights daily and they have been stable.  Overall patient is doing well. Denies f/c/s, n/v/d, hemoptysis, PND, leg swelling Denies chest pain or edema       Observations/Objective:     09/04/2023   10:51 AM 08/29/2023    8:29 AM 08/29/2023    6:18 AM  Vitals with BMI  Height 4\' 10"     Weight 178 lbs  178 lbs 2 oz  BMI 37.21  37.24  Systolic  114 117  Diastolic  75 77  Pulse  75 75      Assessment and Plan:  1. Acute on chronic heart failure, unspecified heart failure type (HCC)   2. Primary hypertension   -please load with cardiology next week  -Continue current medications  Current Outpatient Medications on File Prior to Visit  Medication Sig Dispense Refill   atorvastatin (LIPITOR) 80 MG tablet Take 1 tablet (80 mg total) by mouth daily. 30 tablet 5   carvedilol (COREG)  12.5 MG tablet Take 1 tablet (12.5 mg total) by mouth 2 (two) times daily with a meal. 60 tablet 0   dapagliflozin propanediol (FARXIGA) 10 MG TABS tablet Take 1 tablet (10 mg total) by mouth daily before breakfast. 30 tablet 0   ferrous sulfate 325 (65 FE) MG tablet Take 1 tablet (325 mg total) by mouth every other day. 30 tablet 0   levalbuterol (XOPENEX HFA) 45 MCG/ACT inhaler Inhale 1-2 puffs into the lungs every 6 (six) hours as needed for wheezing. 15 g 0   Multiple Vitamin (MV-ONE PO) Take 1 capsule by mouth every morning.     sacubitril-valsartan (ENTRESTO) 49-51 MG Take 1 tablet by mouth 2 (two) times daily. 60 tablet 0   spironolactone (ALDACTONE) 25 MG tablet Take 0.5 tablets (12.5 mg total) by mouth daily. 15 tablet 0   guaiFENesin-codeine 100-10 MG/5ML syrup Take 5 mLs by mouth 3 (three) times daily as needed for cough. (Patient not taking: Reported on 08/25/2023) 120 mL 0   No current facility-administered medications on file prior to visit.   Follow up:  Follow up in 3months    I discussed the assessment and treatment plan with the patient. The patient was provided an opportunity to ask questions and all were answered. The patient agreed with the plan and demonstrated an understanding of the instructions.   The patient was advised to call back or seek an in-person evaluation if  the symptoms worsen or if the condition fails to improve as anticipated.  I provided 22 minutes of non-face-to-face time during this encounter.   Ivonne Andrew, NP

## 2023-09-04 NOTE — Patient Instructions (Signed)
1. Acute on chronic heart failure, unspecified heart failure type (HCC)   2. Primary hypertension   -please load with cardiology next week  -Continue current medications  Current Outpatient Medications on File Prior to Visit  Medication Sig Dispense Refill   atorvastatin (LIPITOR) 80 MG tablet Take 1 tablet (80 mg total) by mouth daily. 30 tablet 5   carvedilol (COREG) 12.5 MG tablet Take 1 tablet (12.5 mg total) by mouth 2 (two) times daily with a meal. 60 tablet 0   dapagliflozin propanediol (FARXIGA) 10 MG TABS tablet Take 1 tablet (10 mg total) by mouth daily before breakfast. 30 tablet 0   ferrous sulfate 325 (65 FE) MG tablet Take 1 tablet (325 mg total) by mouth every other day. 30 tablet 0   levalbuterol (XOPENEX HFA) 45 MCG/ACT inhaler Inhale 1-2 puffs into the lungs every 6 (six) hours as needed for wheezing. 15 g 0   Multiple Vitamin (MV-ONE PO) Take 1 capsule by mouth every morning.     sacubitril-valsartan (ENTRESTO) 49-51 MG Take 1 tablet by mouth 2 (two) times daily. 60 tablet 0   spironolactone (ALDACTONE) 25 MG tablet Take 0.5 tablets (12.5 mg total) by mouth daily. 15 tablet 0   guaiFENesin-codeine 100-10 MG/5ML syrup Take 5 mLs by mouth 3 (three) times daily as needed for cough. (Patient not taking: Reported on 08/25/2023) 120 mL 0   No current facility-administered medications on file prior to visit.   Follow up:  Follow up in 3months

## 2023-09-05 ENCOUNTER — Other Ambulatory Visit (HOSPITAL_COMMUNITY): Payer: Self-pay | Admitting: Cardiology

## 2023-09-05 DIAGNOSIS — I5022 Chronic systolic (congestive) heart failure: Secondary | ICD-10-CM

## 2023-10-09 ENCOUNTER — Telehealth (HOSPITAL_COMMUNITY): Payer: Self-pay

## 2023-10-09 NOTE — Telephone Encounter (Signed)
Called and left patient a voice message to confirm/remind patient of their appointment at the [facility] on 10/10/23.   And to bring in all medications and/or complete list.

## 2023-10-10 ENCOUNTER — Ambulatory Visit (HOSPITAL_COMMUNITY)
Admission: RE | Admit: 2023-10-10 | Discharge: 2023-10-10 | Disposition: A | Discharge status: Home / Self Care | Payer: BC Managed Care – PPO | Source: Ambulatory Visit | Attending: Family Medicine

## 2023-10-10 ENCOUNTER — Ambulatory Visit (HOSPITAL_COMMUNITY)
Admission: RE | Admit: 2023-10-10 | Discharge: 2023-10-10 | Disposition: A | Discharge status: Home / Self Care | Payer: BC Managed Care – PPO | Source: Ambulatory Visit | Attending: Cardiology | Admitting: Cardiology

## 2023-10-10 ENCOUNTER — Encounter (HOSPITAL_COMMUNITY): Payer: Self-pay

## 2023-10-10 VITALS — BP 170/98 | HR 80 | Wt 181.0 lb

## 2023-10-10 DIAGNOSIS — Z79899 Other long term (current) drug therapy: Secondary | ICD-10-CM | POA: Diagnosis not present

## 2023-10-10 DIAGNOSIS — I340 Nonrheumatic mitral (valve) insufficiency: Secondary | ICD-10-CM | POA: Insufficient documentation

## 2023-10-10 DIAGNOSIS — I110 Hypertensive heart disease with heart failure: Secondary | ICD-10-CM | POA: Diagnosis not present

## 2023-10-10 DIAGNOSIS — E785 Hyperlipidemia, unspecified: Secondary | ICD-10-CM | POA: Diagnosis not present

## 2023-10-10 DIAGNOSIS — I160 Hypertensive urgency: Secondary | ICD-10-CM | POA: Diagnosis not present

## 2023-10-10 DIAGNOSIS — I10 Essential (primary) hypertension: Secondary | ICD-10-CM

## 2023-10-10 DIAGNOSIS — R222 Localized swelling, mass and lump, trunk: Secondary | ICD-10-CM | POA: Diagnosis not present

## 2023-10-10 DIAGNOSIS — I5022 Chronic systolic (congestive) heart failure: Secondary | ICD-10-CM | POA: Insufficient documentation

## 2023-10-10 DIAGNOSIS — G4733 Obstructive sleep apnea (adult) (pediatric): Secondary | ICD-10-CM | POA: Diagnosis not present

## 2023-10-10 DIAGNOSIS — I429 Cardiomyopathy, unspecified: Secondary | ICD-10-CM | POA: Diagnosis not present

## 2023-10-10 LAB — BASIC METABOLIC PANEL
Anion gap: 6 (ref 5–15)
BUN: 15 mg/dL (ref 6–20)
CO2: 25 mmol/L (ref 22–32)
Calcium: 9.1 mg/dL (ref 8.9–10.3)
Chloride: 108 mmol/L (ref 98–111)
Creatinine, Ser: 0.88 mg/dL (ref 0.44–1.00)
GFR, Estimated: >60 mL/min (ref >60)
Glucose, Bld: 99 mg/dL (ref 70–99)
Potassium: 4.1 mmol/L (ref 3.5–5.1)
Sodium: 139 mmol/L (ref 135–145)

## 2023-10-10 LAB — LIPID PANEL
Cholesterol: 228 mg/dL — ABNORMAL HIGH (ref 0–200)
HDL: 55 mg/dL (ref >40)
LDL Cholesterol: 161 mg/dL — ABNORMAL HIGH (ref 0–99)
Total CHOL/HDL Ratio: 4.1 {ratio}
Triglycerides: 58 mg/dL (ref <150)
VLDL: 12 mg/dL (ref 0–40)

## 2023-10-10 LAB — ECHOCARDIOGRAM COMPLETE
AR max vel: 1.97 cm2
AV Area VTI: 2.07 cm2
AV Area mean vel: 1.92 cm2
AV Mean grad: 3.5 mm[Hg]
AV Peak grad: 5.7 mm[Hg]
Ao pk vel: 1.2 m/s
Area-P 1/2: 5.16 cm2
Calc EF: 39.3 %
Est EF: 40
MV M vel: 5.38 m/s
MV Peak grad: 115.8 mm[Hg]
MV VTI: 1.77 cm2
Radius: 0.2 cm
S' Lateral: 4.6 cm
Single Plane A2C EF: 38.2 %
Single Plane A4C EF: 38.1 %

## 2023-10-10 LAB — BRAIN NATRIURETIC PEPTIDE: B Natriuretic Peptide: 1189 pg/mL — ABNORMAL HIGH (ref 0.0–100.0)

## 2023-10-10 MED ORDER — FUROSEMIDE 20 MG PO TABS: 20.0000 mg | ORAL_TABLET | ORAL | 6 refills | Status: DC | PRN

## 2023-10-10 MED ORDER — SPIRONOLACTONE 25 MG PO TABS: 25.0000 mg | ORAL_TABLET | Freq: Every day | ORAL | 6 refills | Status: DC

## 2023-10-10 MED ORDER — ENTRESTO 97-103 MG PO TABS: 1.0000 | ORAL_TABLET | Freq: Two times a day (BID) | ORAL | 6 refills | Status: DC

## 2023-10-10 NOTE — Patient Instructions (Addendum)
Thank you for coming in today  If you had labs drawn today, any labs that are abnormal the clinic will call you No news is good news  Medications: Increase Entresto to 97/103 mg 1 tablet twice daily Increase Spirolactone 25 mg 1 tablet daily Lasix 20 mg 1 tablet daily as needed For weight gain of 3 lbs in 24 hours or 5 lbs in a week   Follow up appointments: Your physician recommends that you return for lab work in: 7-10 days BMET  Your physician recommends that you schedule a follow-up appointment in:  3 weeks in Pharmacy  6 months With Dr. Earlean Shawl will receive a reminder letter in the mail a few months in advance. Or call in March 2025  If you don't receive a letter, please call our office to schedule the follow-up appointment.    Do the following things EVERYDAY: Weigh yourself in the morning before breakfast. Write it down and keep it in a log. Take your medicines as prescribed Eat low salt foods--Limit salt (sodium) to 2000 mg per day.  Stay as active as you can everyday Limit all fluids for the day to less than 2 liters   At the [facility], you and your health needs are our priority. As part of our continuing mission to provide you with exceptional heart care, we have created designated Provider Care Teams. These Care Teams include your primary Cardiologist (physician) and Advanced Practice Providers (APPs- Physician Assistants and Nurse Practitioners) who all work together to provide you with the care you need, when you need it.   You may see any of the following providers on your designated Care Team at your next follow up: Dr Arvilla Meres Dr Marca Ancona Dr. Marcos Eke, NP Robbie Lis, [facility], [place] Brynda Peon, NP Karle Plumber, PharmD   Please be sure to bring in all your medications bottles to every appointment.    Thank you for choosing [facility]  If you have any questions or concerns before your next appointment please send [place] a message through [place] or call our office at [phone].    TO LEAVE A MESSAGE FOR THE NURSE SELECT OPTION 2, PLEASE LEAVE A MESSAGE INCLUDING: YOUR NAME DATE OF BIRTH CALL BACK NUMBER REASON FOR CALL**this is important as we prioritize the call backs  YOU WILL RECEIVE A CALL BACK THE SAME DAY AS LONG AS YOU CALL BEFORE 4:00 PM

## 2023-10-10 NOTE — Progress Notes (Signed)
[facility] Note  Primary Care: Orion Crook, NP CT Surgery: Dr Cliffton Asters  HF Cardiologist: Dr. 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    Admitted 2/23 with new systolic HF. She was markedly hypertensive with SBP 213/140. Incidentally noted pulmonary edema, small pleural effusions and nodular appearance anterior mediastinal soft tissue density (? Thymic hyperplasia). CTS consulted regarding mediastinal mass. Echo showed EF 45%, RV mildly reduced, moderate MR. She was diuresed with IV lasix and AHF consulted. Underwent R/LHC showing no significant CAD (NICM), low filling pressures. CMRI showed normal LV size with mild LV hypertrophy, EF of 31% with diffuse hypokinesis, normal RV size with mild systolic dysfunction and EF of 38%, and no definite LGE noted.  She was started on GDMT.   Echo in 3/23 showed EF up to 55-60%, normal RV, normal IVC.   Admit 08/24/23-08/29/23 for bronchitis and HTN urgency. Was seen a week prior in ED for URI. RVP negative. Trio She breifly required Bipap and nitroglycerin gtt for elevated trops 42>38.thought to be demand ischemia. Repeat Echo performed, results below. She was continued on Coreg, Westley Foots, and Farxiga at discharge. Discharge weight 80.8 kg.  Echo (10/24): EF 40-45%, mild LVH, RV nl, mod MR  Today she returns for HF follow up. 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 Denies increasing SOB, CP, dizziness, or PND/Orthopnea. Sleeping supine at home. Had noticed some welling in her lower extremities and took an extra dose of her Cleda Daub 12.5mg . Appetite ok. No fever or chills. BP at home 130-140/70-80s, intermittently has high readings. Taking all medications. Last weight 178 lbs.  Echo today 45-50% on Dr. Shirlee Latch read.   Labs  (2/23): K 4.2, creatinine 1.02, LDL 184 Labs (3/23): K 4.3, creatinine 0.83 Labs (5/23): K 4.1, creatinine 0.74, LDL 146, HDL 55 Labs (11/24): K 4.0, creatinine 1.06  ECG (personally reviewed): NSR 83 bpm, bilateral atrial enlargement PR 156  PMH: 1. Chronic systolic CHF: Nonischemic cardiomyopathy, ?due to uncontrolled HTN.  - Echo (2/23): EF 40-45%, RV mildly reduced, moderate MR. - R/LHC (2/23): no significant CAD, NICM, low filling pressures, preserved CO. RA mean 1, PA 22/4 (2), CO/CI 5.43/3.21 - cMRI (2/23): Difficult due to recent Feraheme, but normal LV size with mild LV hypertrophy, EF of 31% with diffuse hypokinesis, normal RV size with mild systolic dysfunction and EF of 38%, and no definite LGE noted.  - Echo (3/23): EF 55-60%, normal RV.  - Echo (10/24): EF 40-45%, mild LVH, RV nl, mod MR - Echo (12/24): EF 45-50% on Dr. Alford Highland read 2. HTN 3. OSA 4. Fe deficiency anemia 5. 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 30 tablet 5   carvedilol (COREG) 12.5 MG tablet Take 1 tablet (12.5 mg total) by mouth 2 (two) times daily with a meal. 60 tablet 0   dapagliflozin propanediol (FARXIGA) 10 MG TABS tablet Take 1 tablet (10 mg total) by mouth daily before breakfast. 30 tablet 0   ferrous sulfate 325 (65 FE) MG tablet Take 1 tablet (325 mg total) by mouth every other day. 30 tablet 0   levalbuterol (XOPENEX HFA) 45 MCG/ACT inhaler Inhale 1-2 puffs into the lungs every 6 (six) hours as needed for  wheezing. 15 g 0   Multiple Vitamin (MV-ONE PO) Take 1 capsule by mouth every morning.     spironolactone (ALDACTONE) 25 MG tablet Take 0.5 tablets (12.5 mg total) by mouth daily. 15 tablet 0   No current facility-administered medications for this encounter.   Allergies  Allergen Reactions   Shellfish Allergy Anaphylaxis   Social History    Socioeconomic History   Marital status: Married    Spouse name: Not on file   Number of children: Not on file   Years of education: Not on file   Highest education level: Not on file  Occupational History   Not on file  Tobacco Use   Smoking status: Never   Smokeless tobacco: Never  Vaping Use   Vaping status: Never Used  Substance and Sexual Activity   Alcohol use: No   Drug use: No   Sexual activity: Yes    Birth control/protection: None  Other Topics Concern   Not on file  Social History Narrative   Not on file   Social Drivers of Health   Financial Resource Strain: Not on file  Food Insecurity: Not on file  Transportation Needs: Not on file  Physical Activity: Not on file  Stress: Not on file  Social Connections: Not on file  Intimate Partner Violence: Not on file   Family History  Problem Relation Age of Onset   Cancer Mother    Heart disease Mother    Heart disease Father    Hypertension Father    Stroke Father    Cancer Maternal Aunt    Diabetes Maternal Grandmother    Wt Readings from Last 3 Encounters:  10/10/23 82.1 kg (181 lb)  09/04/23 80.7 kg (178 lb)  08/29/23 80.8 kg (178 lb 2.1 oz)   BP (!) 170/98   Pulse 80   Wt 82.1 kg (181 lb)   SpO2 98%   BMI 37.83 kg/m   PHYSICAL EXAM: General: Well appearing. No distress on RA. Walked into clinic. HEENT: neck supple.   Cardiac: JVP ~6cm. S1 and S2 present. No murmurs or rub. Resp: Lung sounds clear and equal B/L Abdomen: Soft, non-tender, non-distended. + BS. Extremities: Warm and dry. No rash, cyanosis.  Trace edema.  Neuro: Alert and oriented x3. Affect pleasant. Moves all extremities without difficulty.  ASSESSMENT & PLAN: 1. Chronic Systolic CHF: Echo in 2/23 with EF 40-45% range with mild LV dilation and mild LVH, moderate MR, moderate LAE, mildly decreased RV systolic function. She has uncontrolled HTN as a major risk factor. HIV and ANA negative and TSH normal. No ETOH or drug history.  Spontaneous miscarriages concerning for antiphospholipid antibody syndrome, but when this affects the heart, it generally leads to valvular problems and not cardiomyopathy => serologic workup negative for APLAS. No strong FH of CMP.  No significant coronary disease on cath. Cardiac MRI in 2/23 showed normal LV size with mild LV hypertrophy, EF of 31% with diffuse hypokinesis, normal RV size with mild systolic dysfunction and EF of 38%, and no definite LGE noted.  With medical therapy, echo in 3/23 showed EF up to 55-60%. EF dow to 40-45% with hypertensive urgency 10/24. Echo today 45-50%. She slightly volume up on exam, NYHA class I-II.  - Start PRN Lasix 20 mg. - Increase Entresto 97/103 mg bid. BMET/BNP today. - Increase spironolactone 25 mg daily. Repeat BMET in 7-10days - Continue Coreg 12.5 mg bid. If remains HTN, increase at follow up - Continue Farxiga 10 mg daily. -  Discussed avoidance of pregnancy with current meds 2.  HTN: Uncontrolled. Renal artery dopplers did not look like clinically significant RAS or fibromuscular dysplasia.  170/98 today in clinic after taking am medications. She has a BP cuff, BP 130-140/70-80s at home, with intermittent high reads. Admit in 10/24 for HTN urgency.  - GDMT as above 3.  H/o multiple miscarriages/spontaneous abortions: ?Antiphospholipid antibody syndrome > serologic workup for APLAS negative. 4. 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  - She has been seen by CT surgery. She needs to make appointment with Dr. Cliffton Asters for follow up. 5. OSA: Uses CPAP. 6. HLD: Last LDL 146 - On atorvastatin 80 mg daily. - Repeat lipids today  Follow up in 3 weeks with PharmD for BP/GDMT titration Follow up in 6 months with Dr. Shirlee Latch.  [place] Annaston Upham, NP 10/10/2023

## 2023-10-20 NOTE — Progress Notes (Signed)
 [facility] Note   Primary Care: Christain Asp, NP CT Surgery: Dr Shyrl  HF Cardiologist: Dr. 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    Admitted 11/2021 with new systolic HF. She was markedly hypertensive with SBP 213/140. Incidentally noted pulmonary edema, small pleural effusions and nodular appearance anterior mediastinal soft tissue density (? Thymic hyperplasia). CTS consulted regarding mediastinal mass. Echo showed EF 45%, RV mildly reduced, moderate MR. She was diuresed with IV Lasix  and AHF consulted. Underwent R/LHC showing no significant CAD (NICM), low filling pressures. CMRI showed normal LV size with mild LV hypertrophy, EF of 31% with diffuse hypokinesis, normal RV size with mild systolic dysfunction and EF of 38%, and no definite LGE noted.  She was started on GDMT.    Echo in 12/2021 showed EF up to 55-60%, normal RV, normal IVC.    Admit 08/24/23-08/29/23 for bronchitis and HTN urgency. Was seen a week prior in ED for URI. RVP negative. She briefly required Bipap and nitroglycerin  gtt for elevated trops 42>38.thought to be demand ischemia. Repeat Echo performed, results below. She was continued on carvedilol , Entresto , Spironolactone , and Farxiga  at discharge. Discharge weight 80.8 kg.   Echo (07/2023): EF 40-45%, mild LVH, RV nl, mod MR   Returned to [facility] for HF follow up. 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 Overall was feeling fine. Denied increasing SOB, CP, dizziness, or PND/Orthopnea. Had been sleeping supine at home. Had noticed some swelling in her lower extremities and took an extra dose of her Spironolactone  12.5 mg. Appetite was ok. No fever or chills. BP at home was 130-140/70-80s, intermittently has high readings. Reported taking all medications.  Last weight was 178 lbs.  Echo 10/10/23 EF 40% RV nml.   Today she returns to HF clinic for pharmacist medication titration. At last visit with APP, spironolactone  was increased to 25 mg daily and Entresto  was increased to 97/103 mg BID. Additionally, PRN Lasix  was added. Overall she is feeling well today. Says she feels a lot better than before. No dizziness or lightheadedness. No CP. Notes occasional palpitations. These occur just for a few minutes a couple times per week and generally occur after she has lifted something heavy. Able to walk 2 blocks before getting SOB and needing to rest. No longer SOB going up stairs. Has not been weighing herself at home. Weight up 2 lbs from last clinic visit. Has only needed one dose of PRN Lasix  since Christmas. No LEE, PND or orthopnea. Appetite has been ok. Has been trying to be better about following a low sodium diet. She manages her own medications. Notes she uses a timer to help her remember. Says she misses her medications approximately 5 times per month. Medication history completed in clinic today. Has not been taking atorvastatin . Has been taking carvedilol  25 mg BID (12.5 mg BID was on her medication list). Dispense history checked and carvedilol , Entresto  and Farxiga  were all listed as last filled 08/29/23. However, she states she has been taking these, likely from an old stockpile, and she did bring her carvedilol  and Entresto  pill bottles to clinic. Has not been taking her BP at home. BP in clinic 180/112, improved to 164/108 after sitting for 15 minutes.    HF Medications: Carvedilol  12.5 mg BID - has been taking 25 mg BID  Entresto  97/103 mg BID Spironolactone  25 mg daily Farxiga  10 mg daily Lasix  20 mg PRN  Has the patient been experiencing any side effects to the medications prescribed?  no  Does the patient have any problems obtaining medications due to transportation or finances?  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 Has copay cards for  Farxiga  and Entresto , which she needs to activate.   Understanding of regimen: good Understanding of indications: good Potential of compliance: good Patient understands to avoid NSAIDs. Patient understands to avoid decongestants.    Pertinent Lab Values: 10/21/23: Serum creatinine 0.99, BUN 21, Potassium 4.9, Sodium 135  Vital Signs: Weight: 183 lbs (last clinic weight: 181 lbs) Blood pressure: 180/112, improved to 164/108  Heart rate: 84   Assessment/Plan: 1. Chronic Systolic CHF: Echo in 11/2021 with EF 40-45% range with mild LV dilation and mild LVH, moderate MR, moderate LAE, mildly decreased RV systolic function. She has uncontrolled HTN as a major risk factor. HIV and ANA negative and TSH normal. No ETOH or drug history. Spontaneous miscarriages concerning for antiphospholipid antibody syndrome, but when this affects the heart, it generally leads to valvular problems and not cardiomyopathy => serologic workup negative for APLAS. No strong FH of CMP.  No significant coronary disease on cath. Cardiac MRI in 11/2021 showed normal LV size with mild LV hypertrophy, EF of 31% with diffuse hypokinesis, normal RV size with mild systolic dysfunction and EF of 38%, and no definite LGE noted.  With medical therapy, echo in 12/2021 showed EF up to 55-60%. EF down to 40-45% with hypertensive urgency 07/2023. Echo 10/10/23 40%  - NYHA class I-II. Euvolemic on exam.  - Continue Lasix  20 mg PRN. - She has been taking carvedilol  25 mg BID, will continue this dose.  - Continue Entresto  97/103 mg BID - Continue spironolactone  25 mg daily - Continue Farxiga  10 mg daily. 2.  HTN: Uncontrolled. Renal artery dopplers did not look like clinically significant RAS or fibromuscular dysplasia.  180/112 today in clinic after taking AM medications, improved to 164/108 after 15 minutes. She has not been checking her BP at home recently. Admit in 07/2023 for HTN urgency.  - Start amlodipine  10 mg daily. - Start  checking BP at home and record in log. Needs to bring log and BP cuff to follow up visit.  3.  H/o multiple miscarriages/spontaneous abortions: ?Antiphospholipid antibody syndrome > serologic workup for APLAS negative. 4. 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  - She has been seen by CT surgery. She needs to make appointment with Dr. Shyrl for follow up. 5. OSA: Uses CPAP. 6. HLD:  - LDL 161 on lipid panel 10/10/23. Has not been taking atorvastatin .  -Restart atorvastatin  80 mg daily. Repeat lipid panel in 8 weeks.   Follow up 3 weeks in Pharmacy Clinic. I have asked her to bring in medication bottles as well as BP cuff and log to next visit.    Tinnie Redman, PharmD, BCPS, BCCP, CPP Heart Failure Clinic Pharmacist [phone]

## 2023-10-21 ENCOUNTER — Ambulatory Visit (HOSPITAL_COMMUNITY)
Admission: RE | Admit: 2023-10-21 | Discharge: 2023-10-21 | Disposition: A | Discharge status: Home / Self Care | Payer: BC Managed Care – PPO | Source: Ambulatory Visit | Attending: Cardiology | Admitting: Cardiology

## 2023-10-21 DIAGNOSIS — I5022 Chronic systolic (congestive) heart failure: Secondary | ICD-10-CM | POA: Diagnosis not present

## 2023-10-21 LAB — BASIC METABOLIC PANEL
Anion gap: 7 (ref 5–15)
BUN: 21 mg/dL — ABNORMAL HIGH (ref 6–20)
CO2: 22 mmol/L (ref 22–32)
Calcium: 9.3 mg/dL (ref 8.9–10.3)
Chloride: 106 mmol/L (ref 98–111)
Creatinine, Ser: 0.99 mg/dL (ref 0.44–1.00)
GFR, Estimated: >60 mL/min (ref >60)
Glucose, Bld: 112 mg/dL — ABNORMAL HIGH (ref 70–99)
Potassium: 4.9 mmol/L (ref 3.5–5.1)
Sodium: 135 mmol/L (ref 135–145)

## 2023-11-03 ENCOUNTER — Ambulatory Visit (HOSPITAL_COMMUNITY)
Admission: RE | Admit: 2023-11-03 | Discharge: 2023-11-03 | Disposition: A | Discharge status: Home / Self Care | Payer: BC Managed Care – PPO | Source: Ambulatory Visit | Attending: Internal Medicine | Admitting: Internal Medicine

## 2023-11-03 ENCOUNTER — Other Ambulatory Visit (HOSPITAL_COMMUNITY): Payer: Self-pay

## 2023-11-03 VITALS — BP 164/108 | HR 84 | Wt 183.0 lb

## 2023-11-03 DIAGNOSIS — R918 Other nonspecific abnormal finding of lung field: Secondary | ICD-10-CM | POA: Insufficient documentation

## 2023-11-03 DIAGNOSIS — I110 Hypertensive heart disease with heart failure: Secondary | ICD-10-CM | POA: Diagnosis not present

## 2023-11-03 DIAGNOSIS — G4733 Obstructive sleep apnea (adult) (pediatric): Secondary | ICD-10-CM | POA: Diagnosis not present

## 2023-11-03 DIAGNOSIS — I5022 Chronic systolic (congestive) heart failure: Secondary | ICD-10-CM | POA: Insufficient documentation

## 2023-11-03 DIAGNOSIS — E7849 Other hyperlipidemia: Secondary | ICD-10-CM | POA: Diagnosis not present

## 2023-11-03 DIAGNOSIS — Z79899 Other long term (current) drug therapy: Secondary | ICD-10-CM | POA: Insufficient documentation

## 2023-11-03 DIAGNOSIS — I5020 Unspecified systolic (congestive) heart failure: Secondary | ICD-10-CM | POA: Diagnosis not present

## 2023-11-03 DIAGNOSIS — E785 Hyperlipidemia, unspecified: Secondary | ICD-10-CM | POA: Diagnosis not present

## 2023-11-03 DIAGNOSIS — I10 Essential (primary) hypertension: Secondary | ICD-10-CM | POA: Diagnosis not present

## 2023-11-03 MED ORDER — AMLODIPINE BESYLATE 10 MG PO TABS: 10.0000 mg | ORAL_TABLET | Freq: Every day | ORAL | 3 refills | Status: DC

## 2023-11-03 MED ORDER — DAPAGLIFLOZIN PROPANEDIOL 10 MG PO TABS: 10.0000 mg | ORAL_TABLET | Freq: Every day | ORAL | 3 refills | Status: DC

## 2023-11-03 MED ORDER — ENTRESTO 97-103 MG PO TABS: 1.0000 | ORAL_TABLET | Freq: Two times a day (BID) | ORAL | 3 refills | Status: DC

## 2023-11-03 MED ORDER — ATORVASTATIN CALCIUM 80 MG PO TABS: 80.0000 mg | ORAL_TABLET | Freq: Every day | ORAL | 3 refills | Status: DC

## 2023-11-03 MED ORDER — CARVEDILOL 25 MG PO TABS: 25.0000 mg | ORAL_TABLET | Freq: Two times a day (BID) | ORAL | 3 refills | Status: DC

## 2023-11-03 NOTE — Patient Instructions (Signed)
 It was a pleasure seeing you today!  MEDICATIONS: -We are changing your medications today -Start amlodipine  10 mg (1 tablet) daily -Restart atorvastatin  80 mg (1 tablet) daily -Call if you have questions about your medications.   NEXT APPOINTMENT: Return to clinic in 3 weeks with Pharmacy Clinic.  In general, to take care of your heart failure: -Limit your fluid intake to 2 Liters (half-gallon) per day.   -Limit your salt intake to ideally 2-3 grams (2000-3000 mg) per day. -Weigh yourself daily and record, and bring that weight diary to your next appointment.  (Weight gain of 2-3 pounds in 1 day typically means fluid weight.) -The medications for your heart are to help your heart and help you live longer.   -Please contact us  before stopping any of your heart medications.  Call the clinic at [phone] with questions or to reschedule future appointments.

## 2023-11-23 NOTE — Progress Notes (Incomplete)
***In Progress***    Advanced Heart Failure Clinic Note   Primary Care: Orion Crook, NP CT Surgery: Dr Cliffton Asters  HF Cardiologist: Dr. Shirlee Latch   HPI:  Alice Hays is a 43 y.o.female with history of HTN and multiple miscarriages (2 ectopic pregnancies + 2 spontaneous abortions), and diagnosis of mediastinal mass and systolic heart failure.    Admitted 11/2021 with new systolic HF. She was markedly hypertensive with SBP 213/140. Incidentally noted pulmonary edema, small pleural effusions and nodular appearance anterior mediastinal soft tissue density (? Thymic hyperplasia). CTS consulted regarding mediastinal mass. Echo showed EF 45%, RV mildly reduced, moderate MR. She was diuresed with IV Lasix and AHF consulted. Underwent R/LHC showing no significant CAD (NICM), low filling pressures. CMRI showed normal LV size with mild LV hypertrophy, EF of 31% with diffuse hypokinesis, normal RV size with mild systolic dysfunction and EF of 38%, and no definite LGE noted.  She was started on GDMT.    Echo in 12/2021 showed EF up to 55-60%, normal RV, normal IVC.    Admit 08/24/23-08/29/23 for bronchitis and HTN urgency. Was seen a week prior in ED for URI. RVP negative. She briefly required Bipap and nitroglycerin gtt for elevated trops 42>38.thought to be demand ischemia. Repeat Echo performed, results below. She was continued on carvedilol, Entresto, Spironolactone, and Farxiga at discharge. Discharge weight 80.8 kg.   Echo (07/2023): EF 40-45%, mild LVH, RV nl, mod MR   Returned to [facility] for HF follow up. 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 Overall was feeling fine. Denied increasing SOB, CP, dizziness, or PND/Orthopnea. Had been sleeping supine at home. Had noticed some swelling in her lower extremities and took an extra dose of her Spironolactone 12.5 mg. Appetite was ok. No fever or chills. BP at home was 130-140/70-80s, intermittently has high readings. Reported  taking all medications. Last weight was 178 lbs.   Echo 10/10/23 EF 40% RV nml.  On 11/02/22, she returned to HF clinic for pharmacist medication titration. Overall she was feeling well and that she felt a lot better than before. Denied CP, SOB, dizziness, or lightheadedness. Noted occasional palpitations that occurred just for a few minutes a couple times per week and generally occur after she has lifted something heavy. She had not been weighing herself at home and stated that she only needed one dose of PRN Lasix since Christmas. She reported no LEE, PND or orthopnea. Appetite was ok. Had been trying to be better about following a low sodium diet. She manages her own medications and noted that she uses a timer to help her remember. She stated that she misses her medications approximately 5 times per month. She was taking carvedilol 25 mg BID (12.5 mg BID was on her medication list). Her last fills of [place] were listed on 08/29/23, but she reported having plenty of supply at home (likely from an old stockpile). She had not been taking her BP at home and in clinic it was still elevated at rest (164/108 mmHg). She was started on amlodipine 10 mg daily. She was not taking her atorvastatin and refills were sent to her pharmacy.  Has not been taking atorvastatin. Has been taking carvedilol 25 mg BID (12.5 mg BID was on her medication list). Dispense history checked and carvedilol, Entresto and Marcelline Deist were all listed as last filled 08/29/23. However, she states she has been taking these, likely from an old stockpile, and she did bring her carvedilol and Entresto pill  bottles to clinic. Has not been taking her BP at home. BP in clinic 180/112, improved to 164/108 after sitting for 15 minutes. Patient was star   Today she returns to HF clinic for pharmacist medication titration. At last visit with MD ***.   Overall feeling ***. Dizziness, lightheadedness, fatigue:  Chest pain or  palpitations:  How is your breathing?: *** SOB: Able to complete all ADLs. Activity level ***  Weight at home pounds. Takes furosemide/torsemide/bumex *** mg *** daily.  LEE PND/Orthopnea  Appetite *** Low-salt diet:   Physical Exam Cost/affordability of meds   HF Medications: Carvedilol 25 mg BID Entresto 97/103 mg BID Spironolactone 25 mg daily Farxiga 10 mg daily Lasix 20 mg PRN   Has the patient been experiencing any side effects to the medications prescribed?  {YES NO:22349}  Does the patient have any problems obtaining medications due to transportation or finances?   {YES NO:22349}  Understanding of regimen: {excellent/good/fair/poor:19665} Understanding of indications: {excellent/good/fair/poor:19665} Potential of compliance: {excellent/good/fair/poor:19665} Patient understands to avoid NSAIDs. Patient understands to avoid decongestants.    Pertinent Lab Values: 10/21/23: serum creatinine 0.99, BUN 21, Potassium 4.9, Sodium 135   Vital Signs: Weight: *** (last clinic weight: 183 lbs) Blood pressure: *** mmHg (last BP: 164/108 mmHg) Heart rate: *** bpm (last HR: 84 bpm)  HPI:   Last visit:  This visit:   F/u Q's: palpitations imp, home weights, low Na diet, timer for medication reminders, home BP, norvasc tolerabiltiy  Amlodipine + atorvastatin not picked up  BB: coreg 25 BID >>50 mg BID RAASi: Entresto 97/103 BID MRA: Cleda Daub 25 dy SGLT2i: Farxiga 10 dy Diuresis: Lasix 20 mg PRN  Other BP meds: norvasc 10 dy  1. BP improved ~130/80 OR not started norvasc; inc coreg 2. BP not improved AND on norvasc; add bidil, Bmet at f/u in 3 weeks  Bmet today Lipid panel due 12/29/22 (or 8 weeks after patient resumes)  F/u w/ Pharmacy (3 weeks)  Assessment/Plan: 1. Chronic Systolic CHF: Echo in 11/2021 with EF 40-45% range with mild LV dilation and mild LVH, moderate MR, moderate LAE, mildly decreased RV systolic function. She has uncontrolled HTN as a major  risk factor. HIV and ANA negative and TSH normal. No ETOH or drug history. Spontaneous miscarriages concerning for antiphospholipid antibody syndrome, but when this affects the heart, it generally leads to valvular problems and not cardiomyopathy => serologic workup negative for APLAS. No strong FH of CMP.  No significant coronary disease on cath. Cardiac MRI in 11/2021 showed normal LV size with mild LV hypertrophy, EF of 31% with diffuse hypokinesis, normal RV size with mild systolic dysfunction and EF of 38%, and no definite LGE noted.  With medical therapy, echo in 12/2021 showed EF up to 55-60%. EF down to 40-45% with hypertensive urgency 07/2023. Echo 10/10/23 40%  - NYHA class I-II. Euvolemic on exam.  - Continue Lasix 20 mg PRN. - She has been taking carvedilol 25 mg BID, will continue this dose.  - Continue Entresto 97/103 mg BID - Continue spironolactone 25 mg daily - Continue Farxiga 10 mg daily. 2.  HTN: Uncontrolled. Renal artery dopplers did not look like clinically significant RAS or fibromuscular dysplasia.  180/112 today in clinic after taking AM medications, improved to 164/108 after 15 minutes. She has not been checking her BP at home recently. Admit in 07/2023 for HTN urgency.  - Start amlodipine 10 mg daily. - Start checking BP at home and record in log. Needs to bring log  and BP cuff to follow up visit.  3.  H/o multiple miscarriages/spontaneous abortions: ?Antiphospholipid antibody syndrome > serologic workup for APLAS negative. 4. 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  - She has been seen by CT surgery. She needs to make appointment with Dr. Cliffton Asters for follow up. 5. OSA: Uses CPAP. 6. HLD:  - LDL 161 on lipid panel 10/10/23. Has not been taking atorvastatin.  -Restart atorvastatin 80 mg daily. Repeat lipid panel in 8 weeks.   Follow up *** in 3 weeks in HF clinic  Wilmer Floor, PharmD PGY2 Cardiology Pharmacy Resident [phone]

## 2023-11-24 ENCOUNTER — Other Ambulatory Visit (HOSPITAL_COMMUNITY): Payer: Self-pay

## 2023-11-24 ENCOUNTER — Inpatient Hospital Stay (HOSPITAL_COMMUNITY)
Admission: RE | Admit: 2023-11-24 | Discharge: 2023-11-24 | Disposition: A | Discharge status: Home / Self Care | Payer: BC Managed Care – PPO | Source: Ambulatory Visit

## 2023-12-12 ENCOUNTER — Ambulatory Visit: Payer: Self-pay | Admitting: Nurse Practitioner

## 2023-12-29 NOTE — Addendum Note (Signed)
 Encounter addended by: Wilmer Floor, [facility] on: 12/29/2023 5:06 PM  Actions taken: Pend clinical note, Delete clinical note

## 2024-01-18 NOTE — Progress Notes (Incomplete)
 ***In Progress***    Advanced Heart Failure Clinic Note   Primary Care: Orion Crook, NP CT Surgery: Dr Cliffton Asters  HF Cardiologist: Dr. Shirlee Latch   HPI:  Alice Hays is a 89 YOF with history of HTN and multiple miscarriages (2 ectopic pregnancies + 2 spontaneous abortions), and diagnosis of mediastinal mass and systolic heart failure.    Admitted 11/2021 with new systolic HF. She was markedly hypertensive with BP 213/140 mmHg. Incidentally noted pulmonary edema, small pleural effusions and nodular appearance anterior mediastinal soft tissue density (? Thymic hyperplasia). CTS consulted regarding mediastinal mass. ECHO showed EF 45%, RV mildly reduced, moderate MR. She was diuresed with IV furosemide and AHF consulted. Underwent R/LHC showing no significant CAD (NICM), low filling pressures. CMRI showed normal LV size with mild LV hypertrophy, EF of 31% with diffuse hypokinesis, normal RV size with mild systolic dysfunction and EF of 38%, and no definite LGE noted.  She was started on GDMT.    ECHO in 12/2021 showed EF up to 55-60%, normal RV, normal IVC.    Admit 08/24/23-08/29/23 for bronchitis and HTN urgency. Was seen a week prior in ED for URI. RVP negative. She briefly required Bipap and nitroglycerin gtt for elevated trops 42>38, thought to be demand ischemia. She was continued on carvedilol, Entresto, spironolactone, and Farxiga at discharge. Discharge weight 80.8 kg.   ECHO 07/2023 EF 40-45%, mild LVH, RV normal, moderate MR   Returned to [facility] for HF follow-up on 10/10/23. Overall was feeling fine. Denied increasing SOB, CP, dizziness, or PND/Orthopnea. Had been sleeping supine at home. Had noticed some swelling in her lower extremities and took an extra dose of her Spironolactone 12.5 mg. Appetite was ok. No fever or chills. BP at home was 130-140/70-80s, intermittently has high readings. Reported taking all medications. Last weight was 178 lbs. ECHO EF 40% RV nml.  She was  seen in clinic on 11/03/23 by PharmD. Overall she was feeling well. Denied any SOB or DOE. She reported occasionally feeling chest palpitations. Her weight was up 2 lbs. Her BP was elevated to 164/108 mmHg in clinic. Patient had not been checking her BP at home. She reported good medication adherence with rarely missed doses.  Today she returns to HF clinic for pharmacist medication titration. At last visit with PharmD she was started on amlodipine 10 mg daily and resumed her atorvastatin 80 mg daily.   ***Home BP? ***Bidil tolerance?? ***insert dot phrase***  HF Medications: Carvedilol 25 mg BID Entresto 97/103 mg BID Spironolactone 25 mg daily Farxiga 10 mg daily Furosemide 20 mg PRN  Other BP meds: amlodipine 10 mg daily  Has the patient been experiencing any side effects to the medications prescribed?  {YES NO:22349}  Does the patient have any problems obtaining medications due to transportation or finances?   {YES NO:22349}  Understanding of regimen: {excellent/good/fair/poor:19665} Understanding of indications: {excellent/good/fair/poor:19665} Potential of compliance: {excellent/good/fair/poor:19665} Patient understands to avoid NSAIDs. Patient understands to avoid decongestants.    Pertinent Lab Values: 10/21/23: Serum creatinine 0.99 mg/dL, BUN 21 mg/dL, Potassium 4.9 mmol/L, Sodium 135 mmol/L 10/10/23: BNP 1189 pg/mL  Vital Signs: Weight: *** (last clinic weight: 183 lbs) Blood pressure: *** (last BP 164/108 mmHg) Heart rate: *** (last HR 84 bpm)  Brief A/P: Labs: bmet (didn't check after Entresto inc), lipid panel today If BP elevated, consider bidil f/u in 3 weeks If BP controlled, then no changes, f/u in 3 mos with MD   Assessment/Plan: 1. Chronic Systolic CHF: ECHO in 11/2021 with  EF 40-45% range with mild LV dilation and mild LVH, moderate MR, moderate LAE, mildly decreased RV systolic function. She has uncontrolled HTN as a major risk factor. HIV and ANA  negative and TSH normal. No EtOH or drug history. Spontaneous miscarriages concerning for antiphospholipid antibody syndrome, but when this affects the heart, it generally leads to valvular problems and not cardiomyopathy. Serologic workup negative for APLAS. No strong FH of CMP.  No significant coronary disease on cath. Cardiac MRI in 11/2021 showed normal LV size with mild LV hypertrophy, EF of 31% with diffuse hypokinesis, normal RV size with mild systolic dysfunction and EF of 38%, and no definite LGE noted.  With medical therapy, ECHO in 12/2021 showed EF up to 55-60%. EF down to 40-45% with hypertensive urgency 07/2023. ECHO 10/10/23 40%  - NYHA class I-II***. Euvolemic on exam***.  - Continue furosemide 20 mg PRN. - Continue carvedilol 25 mg BID. - Continue Entresto 97/103 mg BID - Continue spironolactone 25 mg daily - Continue Farxiga 10 mg daily. 2.  HTN: Uncontrolled. Renal artery dopplers did not look like clinically significant RAS or fibromuscular dysplasia.  180/112 today in clinic after taking AM medications, improved to 164/108 after 15 minutes. She has not been checking her BP at home recently. Admit in 07/2023 for HTN urgency.  - Continue amlodipine 10 mg daily. - Start checking BP at home and record in log. Needs to bring log and BP cuff to follow up visit.  3.  History of multiple miscarriages/spontaneous abortions: Antiphospholipid antibody syndrome > serologic workup for APLAS negative. 4. 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  - She has been seen by CT surgery. She needs to make appointment with Dr. Cliffton Asters for follow up. 5. OSA: Uses CPAP. 6. HLD:  - LDL 161 on lipid panel 10/10/23. ***Has not been taking atorvastatin.  -Restart atorvastatin 80 mg daily***. Repeat lipid panel in 8 weeks.***    Follow up 3 weeks in Pharmacy Clinic. I have asked her to bring in medication bottles as well as BP cuff and log to next visit.   Wilmer Floor, PharmD PGY2 Cardiology  Pharmacy Resident

## 2024-01-19 ENCOUNTER — Inpatient Hospital Stay (HOSPITAL_COMMUNITY): Admission: RE | Admit: 2024-01-19 | Source: Ambulatory Visit

## 2024-02-25 ENCOUNTER — Encounter: Payer: Self-pay | Admitting: Nurse Practitioner

## 2024-03-19 ENCOUNTER — Ambulatory Visit (INDEPENDENT_AMBULATORY_CARE_PROVIDER_SITE_OTHER): Payer: Self-pay | Admitting: Nurse Practitioner

## 2024-03-19 ENCOUNTER — Encounter: Payer: Self-pay | Admitting: Nurse Practitioner

## 2024-03-19 VITALS — BP 168/122 | HR 90 | Temp 98.6°F | Wt 182.8 lb

## 2024-03-19 DIAGNOSIS — I10 Essential (primary) hypertension: Secondary | ICD-10-CM | POA: Diagnosis not present

## 2024-03-19 DIAGNOSIS — Z1329 Encounter for screening for other suspected endocrine disorder: Secondary | ICD-10-CM

## 2024-03-19 DIAGNOSIS — Z13220 Encounter for screening for lipoid disorders: Secondary | ICD-10-CM | POA: Diagnosis not present

## 2024-03-19 DIAGNOSIS — F419 Anxiety disorder, unspecified: Secondary | ICD-10-CM

## 2024-03-19 MED ORDER — CLONIDINE HCL 0.1 MG PO TABS: 0.2000 mg | ORAL_TABLET | Freq: Once | ORAL | Status: DC

## 2024-03-19 MED ORDER — HYDROXYZINE HCL 10 MG PO TABS: 10.0000 mg | ORAL_TABLET | Freq: Three times a day (TID) | ORAL | 0 refills | Status: AC | PRN

## 2024-03-19 NOTE — Patient Instructions (Signed)
 1. Primary hypertension (Primary)  - cloNIDine (CATAPRES) tablet 0.2 mg - CBC - Comprehensive metabolic panel with GFR  2. Anxiety  - hydrOXYzine (ATARAX) 10 MG tablet; Take 1 tablet (10 mg total) by mouth 3 (three) times daily as needed.  Dispense: 30 tablet; Refill: 0  3. Lipid screening  - Lipid Panel  4. Thyroid  disorder screen  - TSH

## 2024-03-19 NOTE — Progress Notes (Signed)
 Subjective   Patient ID: Alice Hays, female    DOB: 05-27-1981, 43 y.o.   MRN: [id number]  Chief Complaint  Patient presents with   Medical Management of Chronic Issues    Referring provider: No ref. provider found  Alice Hays is a 43 y.o. female with Past Medical History: No date: Anemia No date: Ectopic pregnancy     Comment:  L adnexa No date: Hx of varicella No date: Hypertension No date: Sleep apnea   HPI  Patient presents today for a physical.  She does need blood work today.  Her blood pressure is elevated in office today.  She states that she took her blood pressure medicine but she is under a lot of stress this week.  Her husband did pass away this week and she is planning his funeral.  We did give her 2 clonidine in office today.  With some relief noted.  Will have patient return in 2 weeks to have blood pressure rechecked.  Hydroxyzine was ordered for anxiety.   Allergies  Allergen Reactions   Shellfish Allergy Anaphylaxis    Immunization History  Administered Date(s) Administered   Unspecified SARS-COV-2 Vaccination 05/11/2019, 04/04/2020, 08/09/2021    Tobacco History: Social History   Tobacco Use  Smoking Status Never  Smokeless Tobacco Never   Counseling given: Not Answered   Outpatient Encounter Medications as of 03/19/2024  Medication Sig   amLODipine  (NORVASC ) 10 MG tablet Take 1 tablet (10 mg total) by mouth daily.   atorvastatin  (LIPITOR ) 80 MG tablet Take 1 tablet (80 mg total) by mouth daily.   carvedilol  (COREG ) 25 MG tablet Take 1 tablet (25 mg total) by mouth 2 (two) times daily with a meal.   dapagliflozin  propanediol (FARXIGA ) 10 MG TABS tablet Take 1 tablet (10 mg total) by mouth daily before breakfast.   ferrous sulfate  325 (65 FE) MG tablet Take 1 tablet (325 mg total) by mouth every other day.   furosemide  (LASIX ) 20 MG tablet Take 1 tablet (20 mg total) by mouth as needed. For weight gain of 3 lbs in 24 hours or 5  lbs in a week   hydrOXYzine (ATARAX) 10 MG tablet Take 1 tablet (10 mg total) by mouth 3 (three) times daily as needed.   Multiple Vitamin (MV-ONE PO) Take 1 capsule by mouth every morning.   sacubitril -valsartan  (ENTRESTO ) 97-103 MG Take 1 tablet by mouth 2 (two) times daily.   spironolactone  (ALDACTONE ) 25 MG tablet Take 1 tablet (25 mg total) by mouth daily.   levalbuterol  (XOPENEX  HFA) 45 MCG/ACT inhaler Inhale 1-2 puffs into the lungs every 6 (six) hours as needed for wheezing. (Patient not taking: Reported on 03/19/2024)   Facility-Administered Encounter Medications as of 03/19/2024  Medication   cloNIDine (CATAPRES) tablet 0.2 mg    Review of Systems  Review of Systems  Constitutional: Negative.   HENT: Negative.    Cardiovascular: Negative.   Gastrointestinal: Negative.   Allergic/Immunologic: Negative.   Neurological: Negative.   Psychiatric/Behavioral: Negative.       Objective:   BP (!) 168/122   Pulse 90   Temp 98.6 F (37 C) (Oral)   Wt 182 lb 12.8 oz (82.9 kg)   SpO2 99%   BMI 38.21 kg/m   Wt Readings from Last 5 Encounters:  03/19/24 182 lb 12.8 oz (82.9 kg)  11/03/23 183 lb (83 kg)  10/10/23 181 lb (82.1 kg)  09/04/23 178 lb (80.7 kg)  08/29/23 178 lb 2.1 oz (80.8  kg)     Physical Exam Vitals and nursing note reviewed.  Constitutional:      General: She is not in acute distress.    Appearance: She is well-developed.  Cardiovascular:     Rate and Rhythm: Normal rate and regular rhythm.  Pulmonary:     Effort: Pulmonary effort is normal.     Breath sounds: Normal breath sounds.  Neurological:     Mental Status: She is alert and oriented to person, place, and time.       Assessment & Plan:   Primary hypertension -     cloNIDine HCl -     CBC -     Comprehensive metabolic panel with GFR  Anxiety -     hydrOXYzine HCl; Take 1 tablet (10 mg total) by mouth 3 (three) times daily as needed.  Dispense: 30 tablet; Refill: 0  Lipid  screening -     Lipid panel  Thyroid  disorder screen -     TSH     Return in about 3 months (around 06/19/2024).   Alice Morel, NP 03/19/2024

## 2024-04-02 ENCOUNTER — Encounter: Payer: Self-pay | Admitting: Nurse Practitioner

## 2024-04-02 ENCOUNTER — Ambulatory Visit: Payer: Self-pay | Admitting: Nurse Practitioner

## 2024-04-02 VITALS — BP 139/90 | HR 70 | Temp 97.0°F | Wt 181.0 lb

## 2024-04-02 DIAGNOSIS — E785 Hyperlipidemia, unspecified: Secondary | ICD-10-CM

## 2024-04-02 DIAGNOSIS — Z23 Encounter for immunization: Secondary | ICD-10-CM | POA: Insufficient documentation

## 2024-04-02 DIAGNOSIS — I10 Essential (primary) hypertension: Secondary | ICD-10-CM | POA: Diagnosis not present

## 2024-04-02 DIAGNOSIS — I5022 Chronic systolic (congestive) heart failure: Secondary | ICD-10-CM | POA: Insufficient documentation

## 2024-04-02 DIAGNOSIS — Z1231 Encounter for screening mammogram for malignant neoplasm of breast: Secondary | ICD-10-CM

## 2024-04-02 NOTE — Assessment & Plan Note (Signed)
 Continue spironolactone  25 mg daily, furosemide  20 mg as needed, carvedilol  25 mg twice daily, Entresto  97-103 mg 1 tablet twice daily.  Farxiga  10 mg daily Maintain close follow-up with cardiology

## 2024-04-02 NOTE — Assessment & Plan Note (Signed)
 Blood pressure is much improved but not at goal of less than 130/80 Continue amlodipine  10 mg daily, spironolactone  25 mg daily, furosemide  20 mg as needed, carvedilol  25 mg twice daily, Entresto  97-103 mg 1 tablet twice daily. Keep upcoming appointment with cardiology DASH diet and commitment to daily physical activity for a minimum of 30 minutes discussed and encouraged, as a part of hypertension management. The importance of attaining a healthy weight is also discussed.     04/02/2024    2:02 PM 04/02/2024    1:52 PM 03/19/2024   11:32 AM 03/19/2024   11:31 AM 03/19/2024   10:34 AM 03/19/2024   10:18 AM 11/03/2023    3:38 PM  BP/Weight  Systolic BP 139 145 168 182 190 191 164  Diastolic BP 90 81 122 129 105 914 108  Wt. (Lbs)  181    182.8   BMI  37.83 kg/m2    38.21 kg/m2

## 2024-04-02 NOTE — Assessment & Plan Note (Signed)
 Lab Results  Component Value Date   CHOL 228 (H) 10/10/2023   HDL 55 10/10/2023   LDLCALC 161 (H) 10/10/2023   TRIG 58 10/10/2023   CHOLHDL 4.1 10/10/2023  On atorvastatin  80 mg daily Checking direct LDL Avoid fatty fried foods, lose weight

## 2024-04-02 NOTE — Progress Notes (Signed)
 New Patient Office Visit  Subjective:  Patient ID: Alice Hays, female    DOB: 25-Jul-1981  Age: 43 y.o. MRN: [id number]  CC:  Chief Complaint  Patient presents with   Hypertension    HPI Alice Hays is a 43 y.o. female  has a past medical history of Acute systolic CHF (congestive heart failure) (HCC), Anemia, Ectopic pregnancy, varicella, Hypertension, and Sleep apnea.  Patient presents for follow-up for hypertension  Hypertension Currently on amlodipine  10 mg daily, spironolactone  25 mg daily, furosemide  20 mg as needed, carvedilol  25 mg twice daily, Entresto  97-103 mg 1 tablet twice daily.  Does walking exercises daily and has been following a low-salt low-fat diet. states that her blood pressure readings at home has been mostly elevated , has upcoming appointment with cardiology next week she currently denies chest Hays, shortness of breath, edema   Lost her husband recently, he has been cremated.  States that she is doing fine .  Emotional support provided      Past Medical History:  Diagnosis Date   Acute systolic CHF (congestive heart failure) (HCC)    Anemia    Ectopic pregnancy    L adnexa   Hx of varicella    Hypertension    Sleep apnea     Past Surgical History:  Procedure Laterality Date   ARM SURGERY     CESAREAN SECTION N/A 06/29/2016   Procedure: CESAREAN SECTION;  Surgeon: Ivery Marking, MD;  Location: [facility];  Service: Obstetrics;  Laterality: N/A;   RIGHT/LEFT HEART CATH AND CORONARY ANGIOGRAPHY N/A 11/30/2021   Procedure: RIGHT/LEFT HEART CATH AND CORONARY ANGIOGRAPHY;  Surgeon: Darlis Eisenmenger, MD;  Location: [facility] INVASIVE CV LAB;  Service: Cardiovascular;  Laterality: N/A;    Family History  Problem Relation Age of Onset   Cancer Mother    Heart disease Mother    Heart disease Father    Hypertension Father    Stroke Father    Cancer Maternal Aunt    Diabetes Maternal Grandmother     Social History   Socioeconomic  History   Marital status: Married    Spouse name: Not on file   Number of children: Not on file   Years of education: Not on file   Highest education level: Not on file  Occupational History   Not on file  Tobacco Use   Smoking status: Never   Smokeless tobacco: Never  Vaping Use   Vaping status: Never Used  Substance and Sexual Activity   Alcohol use: No   Drug use: No   Sexual activity: Yes    Birth control/protection: None  Other Topics Concern   Not on file  Social History Narrative   Not on file   Social Drivers of Health   Financial Resource Strain: Not on file  Food Insecurity: No Food Insecurity (03/19/2024)   Hunger Vital Sign    Worried About Running Out of Food in the Last Year: Never true    Ran Out of Food in the Last Year: Never true  Transportation Needs: Unmet Transportation Needs (03/19/2024)   PRAPARE - Administrator, Civil Service (Medical): Yes    Lack of Transportation (Non-Medical): Yes  Physical Activity: Not on file  Stress: Not on file  Social Connections: Not on file  Intimate Partner Violence: Not on file    ROS Review of Systems  Constitutional:  Negative for appetite change, chills, fatigue and fever.  HENT:  Negative for  congestion, postnasal drip, rhinorrhea and sneezing.   Respiratory:  Negative for cough, shortness of breath and wheezing.   Cardiovascular:  Negative for chest Hays, palpitations and leg swelling.  Gastrointestinal:  Negative for abdominal Hays, constipation, nausea and vomiting.  Genitourinary:  Negative for difficulty urinating, dysuria, flank Hays and frequency.  Musculoskeletal:  Negative for arthralgias, back Hays, joint swelling and myalgias.  Skin:  Negative for color change, pallor, rash and wound.  Neurological:  Negative for dizziness, facial asymmetry, weakness, numbness and headaches.  Psychiatric/Behavioral:  Negative for behavioral problems, confusion, self-injury and suicidal ideas.      Objective:   Today's Vitals: BP (!) 139/90   Pulse 70   Temp (!) 97 F (36.1 C)   Wt 181 lb (82.1 kg)   SpO2 100%   BMI 37.83 kg/m   Physical Exam Vitals and nursing note reviewed.  Constitutional:      General: She is not in acute distress.    Appearance: Normal appearance. She is not ill-appearing, toxic-appearing or diaphoretic.  Eyes:     General: No scleral icterus.       Right eye: No discharge.        Left eye: No discharge.     Extraocular Movements: Extraocular movements intact.     Conjunctiva/sclera: Conjunctivae normal.  Cardiovascular:     Rate and Rhythm: Normal rate and regular rhythm.     Pulses: Normal pulses.     Heart sounds: Normal heart sounds. No murmur heard.    No friction rub. No gallop.  Pulmonary:     Effort: Pulmonary effort is normal. No respiratory distress.     Breath sounds: Normal breath sounds. No stridor. No wheezing, rhonchi or rales.  Chest:     Chest wall: No tenderness.  Abdominal:     General: There is no distension.     Palpations: Abdomen is soft.     Tenderness: There is no abdominal tenderness. There is no right CVA tenderness, left CVA tenderness or guarding.  Musculoskeletal:        General: No swelling, tenderness, deformity or signs of injury.     Right lower leg: No edema.     Left lower leg: No edema.  Skin:    General: Skin is warm and dry.     Capillary Refill: Capillary refill takes less than 2 seconds.     Coloration: Skin is not jaundiced or pale.     Findings: No bruising, erythema or lesion.  Neurological:     Mental Status: She is alert and oriented to person, place, and time.     Motor: No weakness.     Coordination: Coordination normal.     Gait: Gait normal.  Psychiatric:        Mood and Affect: Mood normal.        Behavior: Behavior normal.        Thought Content: Thought content normal.        Judgment: Judgment normal.     Assessment & Plan:   Problem List Items Addressed This Visit        Cardiovascular and Mediastinum   Primary hypertension - Primary   Blood pressure is much improved but not at goal of less than 130/80 Continue amlodipine  10 mg daily, spironolactone  25 mg daily, furosemide  20 mg as needed, carvedilol  25 mg twice daily, Entresto  97-103 mg 1 tablet twice daily. Keep upcoming appointment with cardiology DASH diet and commitment to daily physical activity for a minimum of  30 minutes discussed and encouraged, as a part of hypertension management. The importance of attaining a healthy weight is also discussed.     04/02/2024    2:02 PM 04/02/2024    1:52 PM 03/19/2024   11:32 AM 03/19/2024   11:31 AM 03/19/2024   10:34 AM 03/19/2024   10:18 AM 11/03/2023    3:38 PM  BP/Weight  Systolic BP 139 145 168 182 190 191 164  Diastolic BP 90 81 122 129 105 409 108  Wt. (Lbs)  181    182.8   BMI  37.83 kg/m2    38.21 kg/m2            Relevant Orders   Basic Metabolic Panel   Chronic systolic heart failure (HCC)   Continue spironolactone  25 mg daily, furosemide  20 mg as needed, carvedilol  25 mg twice daily, Entresto  97-103 mg 1 tablet twice daily.  Farxiga  10 mg daily Maintain close follow-up with cardiology        Other   Hyperlipidemia   Lab Results  Component Value Date   CHOL 228 (H) 10/10/2023   HDL 55 10/10/2023   LDLCALC 161 (H) 10/10/2023   TRIG 58 10/10/2023   CHOLHDL 4.1 10/10/2023  On atorvastatin  80 mg daily Checking direct LDL Avoid fatty fried foods, lose weight      Relevant Orders   Direct LDL   Need for pneumococcal 20-valent conjugate vaccination   Patient educated on CDC recommendation for the vaccine. Verbal consent was obtained from the patient, vaccine administered by nurse, no sign of adverse reactions noted at this time. Patient education on arm soreness and use of tylenol  or for this patient  was discussed. Patient educated on the signs and symptoms of adverse effect and advise to contact the office if they occur.      Relevant  Orders   Pneumococcal conjugate vaccine 20-valent (Completed)   Need for Tdap vaccination   Patient educated on CDC recommendation for the vaccine. Verbal consent was obtained from the patient, vaccine administered by nurse, no sign of adverse reactions noted at this time. Patient education on arm soreness and use of tylenol  or for this patient  was discussed. Patient educated on the signs and symptoms of adverse effect and advise to contact the office if they occur.      Relevant Orders   Tdap vaccine greater than or equal to 7yo IM (Completed)   Other Visit Diagnoses       Screening mammogram for breast cancer       Relevant Orders   MM 3D SCREENING MAMMOGRAM BILATERAL BREAST       Outpatient Encounter Medications as of 04/02/2024  Medication Sig   amLODipine  (NORVASC ) 10 MG tablet Take 1 tablet (10 mg total) by mouth daily.   atorvastatin  (LIPITOR ) 80 MG tablet Take 1 tablet (80 mg total) by mouth daily.   carvedilol  (COREG ) 25 MG tablet Take 1 tablet (25 mg total) by mouth 2 (two) times daily with a meal.   dapagliflozin  propanediol (FARXIGA ) 10 MG TABS tablet Take 1 tablet (10 mg total) by mouth daily before breakfast.   ferrous sulfate  325 (65 FE) MG tablet Take 1 tablet (325 mg total) by mouth every other day.   furosemide  (LASIX ) 20 MG tablet Take 1 tablet (20 mg total) by mouth as needed. For weight gain of 3 lbs in 24 hours or 5 lbs in a week   hydrOXYzine  (ATARAX ) 10 MG tablet Take 1 tablet (10 mg total)  by mouth 3 (three) times daily as needed.   Multiple Vitamin (MV-ONE PO) Take 1 capsule by mouth every morning.   sacubitril -valsartan  (ENTRESTO ) 97-103 MG Take 1 tablet by mouth 2 (two) times daily.   spironolactone  (ALDACTONE ) 25 MG tablet Take 1 tablet (25 mg total) by mouth daily.   levalbuterol  (XOPENEX  HFA) 45 MCG/ACT inhaler Inhale 1-2 puffs into the lungs every 6 (six) hours as needed for wheezing. (Patient not taking: Reported on 03/19/2024)   [DISCONTINUED] cloNIDine   (CATAPRES ) tablet 0.2 mg    No facility-administered encounter medications on file as of 04/02/2024.    Follow-up: Return in about 3 months (around 07/03/2024).   Loreto Loescher R Cricket Goodlin, FNP

## 2024-04-02 NOTE — Assessment & Plan Note (Signed)
Patient educated on CDC recommendation for the vaccine. Verbal consent was obtained from the patient, vaccine administered by nurse, no sign of adverse reactions noted at this time. Patient education on arm soreness and use of tylenol orfor this patient  was discussed. Patient educated on the signs and symptoms of adverse effect and advise to contact the office if they occur 

## 2024-04-02 NOTE — Progress Notes (Signed)
 See note above

## 2024-04-02 NOTE — Patient Instructions (Signed)
Around 3 times per week, check your blood pressure 2 times per day. once in the morning and once in the evening. The readings should be at least one minute apart. Write down these values and bring them to your next nurse visit/appointment.  When you check your BP, make sure you have been doing something calm/relaxing 5 minutes prior to checking. Both feet should be flat on the floor and you should be sitting. Use your left arm and make sure it is in a relaxed position (on a table), and that the cuff is at the approximate level/height of your heart.   Blood pressure goal is less than 130/80.    It is important that you exercise regularly at least 30 minutes 5 times a week as tolerated  Think about what you will eat, plan ahead. Choose " clean, green, fresh or frozen" over canned, processed or packaged foods which are more sugary, salty and fatty. 70 to 75% of food eaten should be vegetables and fruit. Three meals at set times with snacks allowed between meals, but they must be fruit or vegetables. Aim to eat over a 12 hour period , example 7 am to 7 pm, and STOP after  your last meal of the day. Drink water,generally about 64 ounces per day, no other drink is as healthy. Fruit juice is best enjoyed in a healthy way, by EATING the fruit.  Thanks for choosing [facility] we consider it a privelige to serve you.

## 2024-04-03 LAB — BASIC METABOLIC PANEL WITH GFR
BUN/Creatinine Ratio: 15 (ref 9–23)
BUN: 11 mg/dL (ref 6–24)
CO2: 22 mmol/L (ref 20–29)
Calcium: 9.8 mg/dL (ref 8.7–10.2)
Chloride: 101 mmol/L (ref 96–106)
Creatinine, Ser: 0.73 mg/dL (ref 0.57–1.00)
Glucose: 83 mg/dL (ref 70–99)
Potassium: 4.2 mmol/L (ref 3.5–5.2)
Sodium: 138 mmol/L (ref 134–144)
eGFR: 106 mL/min/{1.73_m2} (ref >59)

## 2024-04-03 LAB — LDL CHOLESTEROL, DIRECT: LDL Direct: 124 mg/dL — ABNORMAL HIGH (ref 0–99)

## 2024-04-05 ENCOUNTER — Encounter (HOSPITAL_COMMUNITY): Admitting: Cardiology

## 2024-04-05 ENCOUNTER — Ambulatory Visit: Payer: Self-pay | Admitting: Nurse Practitioner

## 2024-05-11 ENCOUNTER — Ambulatory Visit (HOSPITAL_COMMUNITY): Payer: Self-pay | Admitting: Cardiology

## 2024-05-11 ENCOUNTER — Encounter (HOSPITAL_COMMUNITY): Payer: Self-pay | Admitting: Cardiology

## 2024-05-11 ENCOUNTER — Ambulatory Visit (HOSPITAL_COMMUNITY)
Admission: RE | Admit: 2024-05-11 | Discharge: 2024-05-11 | Disposition: A | Discharge status: Home / Self Care | Source: Ambulatory Visit | Attending: Cardiology | Admitting: Cardiology

## 2024-05-11 VITALS — BP 138/84 | HR 70 | Ht <= 58 in | Wt 175.6 lb

## 2024-05-11 DIAGNOSIS — Z79899 Other long term (current) drug therapy: Secondary | ICD-10-CM | POA: Insufficient documentation

## 2024-05-11 DIAGNOSIS — I110 Hypertensive heart disease with heart failure: Secondary | ICD-10-CM | POA: Insufficient documentation

## 2024-05-11 DIAGNOSIS — G4733 Obstructive sleep apnea (adult) (pediatric): Secondary | ICD-10-CM | POA: Insufficient documentation

## 2024-05-11 DIAGNOSIS — I1A0 Resistant hypertension: Secondary | ICD-10-CM | POA: Diagnosis not present

## 2024-05-11 DIAGNOSIS — R9431 Abnormal electrocardiogram [ECG] [EKG]: Secondary | ICD-10-CM | POA: Diagnosis not present

## 2024-05-11 DIAGNOSIS — I5022 Chronic systolic (congestive) heart failure: Secondary | ICD-10-CM | POA: Insufficient documentation

## 2024-05-11 DIAGNOSIS — J9859 Other diseases of mediastinum, not elsewhere classified: Secondary | ICD-10-CM | POA: Diagnosis not present

## 2024-05-11 DIAGNOSIS — D509 Iron deficiency anemia, unspecified: Secondary | ICD-10-CM | POA: Insufficient documentation

## 2024-05-11 DIAGNOSIS — Z8759 Personal history of other complications of pregnancy, childbirth and the puerperium: Secondary | ICD-10-CM | POA: Insufficient documentation

## 2024-05-11 DIAGNOSIS — E785 Hyperlipidemia, unspecified: Secondary | ICD-10-CM | POA: Insufficient documentation

## 2024-05-11 DIAGNOSIS — I5020 Unspecified systolic (congestive) heart failure: Secondary | ICD-10-CM | POA: Diagnosis not present

## 2024-05-11 DIAGNOSIS — I428 Other cardiomyopathies: Secondary | ICD-10-CM | POA: Insufficient documentation

## 2024-05-11 DIAGNOSIS — Z634 Disappearance and death of family member: Secondary | ICD-10-CM | POA: Diagnosis not present

## 2024-05-11 LAB — BASIC METABOLIC PANEL WITH GFR
Anion gap: 10 (ref 5–15)
BUN: 14 mg/dL (ref 6–20)
CO2: 26 mmol/L (ref 22–32)
Calcium: 9.5 mg/dL (ref 8.9–10.3)
Chloride: 104 mmol/L (ref 98–111)
Creatinine, Ser: 0.75 mg/dL (ref 0.44–1.00)
GFR, Estimated: >60 mL/min (ref >60)
Glucose, Bld: 108 mg/dL — ABNORMAL HIGH (ref 70–99)
Potassium: 4.3 mmol/L (ref 3.5–5.1)
Sodium: 140 mmol/L (ref 135–145)

## 2024-05-11 LAB — LIPID PANEL
Cholesterol: 158 mg/dL (ref 0–200)
HDL: 43 mg/dL (ref >40)
LDL Cholesterol: 104 mg/dL — ABNORMAL HIGH (ref 0–99)
Total CHOL/HDL Ratio: 3.7 ratio
Triglycerides: 54 mg/dL (ref <150)
VLDL: 11 mg/dL (ref 0–40)

## 2024-05-11 LAB — BRAIN NATRIURETIC PEPTIDE: B Natriuretic Peptide: 37.9 pg/mL (ref 0.0–100.0)

## 2024-05-11 NOTE — Progress Notes (Signed)
 [facility] Note  Primary Care: Christain Asp, NP CT Surgery: Dr Shyrl  HF Cardiologist: Dr. 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    Admitted 2/23 with new systolic HF. She was markedly hypertensive with SBP 213/140. Incidentally noted pulmonary edema, small pleural effusions and nodular appearance anterior mediastinal soft tissue density (? Thymic hyperplasia). CTS consulted regarding mediastinal mass. Echo showed EF 45%, RV mildly reduced, moderate MR. She was diuresed with IV lasix  and AHF consulted. Underwent R/LHC showing no significant CAD (NICM), low filling pressures. CMRI showed normal LV size with mild LV hypertrophy, EF of 31% with diffuse hypokinesis, normal RV size with mild systolic dysfunction and EF of 38%, and no definite LGE noted.  She was started on GDMT.   Echo in 3/23 showed EF up to 55-60%, normal RV, normal IVC.   Admit 08/24/23-08/29/23 for bronchitis and HTN urgency. Was seen a week prior in ED for URI. RVP negative. Trio She breifly required Bipap and nitroglycerin  gtt for elevated trops 42>38.thought to be demand ischemia. Repeat Echo performed, results below. She was continued on Coreg , Entresto , Spiro, and Farxiga  at discharge. Discharge weight 80.8 kg.  Echo in 10/24 showed EF 40-45%, mild LVH, RV nl, mod MR.  Echo in 12/24 showed EF 40%, global hypokinesis, normal RV.   Today she returns for HF follow up. Grieving husband who passed away 2 months ago.  She is working full time at [employer].  SBP generally 120s when she checks at home.  She rarely takes Lasix . No exertional dyspnea or chest pain.  No lightheadedness.   ECG (personally reviewed): NSR, left axis deviation.    Labs (2/23): K 4.2, creatinine 1.02, LDL 184 Labs (3/23): K 4.3, creatinine  0.83 Labs (5/23): K 4.1, creatinine 0.74, LDL 146, HDL 55 Labs (11/24): K 4.0, creatinine 1.06 Labs (6/25): K 4.2, creatinine 0.73, LDL 124  PMH: 1. Chronic systolic CHF: Nonischemic cardiomyopathy, ?due to uncontrolled HTN.  - Echo (2/23): EF 40-45%, RV mildly reduced, moderate MR. - R/LHC (2/23): no significant CAD, NICM, low filling pressures, preserved CO. RA mean 1, PA 22/4 (2), CO/CI 5.43/3.21 - cMRI (2/23): Difficult due to recent Feraheme , but normal LV size with mild LV hypertrophy, EF of 31% with diffuse hypokinesis, normal RV size with mild systolic dysfunction and EF of 38%, and no definite LGE noted.  - Echo (3/23): EF 55-60%, normal RV.  - Echo (10/24): EF 40-45%, mild LVH, RV nl, mod MR - Echo (12/24): EF 40%, global hypokinesis, normal RV.  2. HTN 3. OSA 4. Fe deficiency anemia 5. 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 90 tablet 3   atorvastatin  (LIPITOR ) 80 MG tablet Take 1 tablet (80 mg total) by mouth daily. 90 tablet 3   carvedilol  (COREG ) 25 MG tablet Take 1 tablet (25 mg total) by mouth 2 (two) times daily with a meal. 180 tablet 3   dapagliflozin  propanediol (FARXIGA ) 10 MG TABS tablet Take 1 tablet (10 mg total) by mouth daily before breakfast. 90 tablet 3   ferrous sulfate  325 (65 FE) MG tablet Take 1 tablet (325 mg total) by mouth every other day. 30 tablet 0   furosemide  (LASIX ) 20  MG tablet Take 1 tablet (20 mg total) by mouth as needed. For weight gain of 3 lbs in 24 hours or 5 lbs in a week 30 tablet 6   hydrOXYzine  (ATARAX ) 10 MG tablet Take 1 tablet (10 mg total) by mouth 3 (three) times daily as needed. 30 tablet 0   levalbuterol  (XOPENEX  HFA) 45 MCG/ACT inhaler Inhale 1-2 puffs into the lungs every 6 (six) hours as needed for wheezing. 15 g 0   Multiple Vitamin (MV-ONE PO) Take 1 capsule by mouth every morning.      sacubitril -valsartan  (ENTRESTO ) 97-103 MG Take 1 tablet by mouth 2 (two) times daily. 180 tablet 3   spironolactone  (ALDACTONE ) 25 MG tablet Take 1 tablet (25 mg total) by mouth daily. 30 tablet 6   No current facility-administered medications for this encounter.   Allergies  Allergen Reactions   Shellfish Allergy Anaphylaxis   Social History   Socioeconomic History   Marital status: Married    Spouse name: Not on file   Number of children: Not on file   Years of education: Not on file   Highest education level: Not on file  Occupational History   Not on file  Tobacco Use   Smoking status: Never   Smokeless tobacco: Never  Vaping Use   Vaping status: Never Used  Substance and Sexual Activity   Alcohol use: No   Drug use: No   Sexual activity: Yes    Birth control/protection: None  Other Topics Concern   Not on file  Social History Narrative   Not on file   Social Drivers of Health   Financial Resource Strain: Not on file  Food Insecurity: No Food Insecurity (03/19/2024)   Hunger Vital Sign    Worried About Running Out of Food in the Last Year: Never true    Ran Out of Food in the Last Year: Never true  Transportation Needs: Unmet Transportation Needs (03/19/2024)   PRAPARE - Administrator, Civil Service (Medical): Yes    Lack of Transportation (Non-Medical): Yes  Physical Activity: Not on file  Stress: Not on file  Social Connections: Not on file  Intimate Partner Violence: Not on file   Family History  Problem Relation Age of Onset   Cancer Mother    Heart disease Mother    Heart disease Father    Hypertension Father    Stroke Father    Cancer Maternal Aunt    Diabetes Maternal Grandmother    Wt Readings from Last 3 Encounters:  05/11/24 79.7 kg (175 lb 9.6 oz)  04/02/24 82.1 kg (181 lb)  03/19/24 82.9 kg (182 lb 12.8 oz)   ROS: All systems reviewed and negative except as per HPI.   BP 138/84   Pulse 70   Ht 4' 10 (1.473 m)   Wt  79.7 kg (175 lb 9.6 oz)   SpO2 99%   BMI 36.70 kg/m   PHYSICAL EXAM: General: NAD Neck: No JVD, no thyromegaly or thyroid  nodule.  Lungs: Clear to auscultation bilaterally with normal respiratory effort. CV: Nondisplaced PMI.  Heart regular S1/S2, no S3/S4, no murmur.  No peripheral edema.  No carotid bruit.  Normal pedal pulses.  Abdomen: Soft, nontender, no hepatosplenomegaly, no distention.  Skin: Intact without lesions or rashes.  Neurologic: Alert and oriented x 3.  Psych: Normal affect. Extremities: No clubbing or cyanosis.  HEENT: Normal.   ASSESSMENT & PLAN: 1. Chronic Systolic CHF: Nonischemic cardiomyopathy.  Echo in 2/23 with EF  40-45% range with mild LV dilation and mild LVH, moderate MR, moderate LAE, mildly decreased RV systolic function. She has uncontrolled HTN as a major risk factor. HIV and ANA negative and TSH normal. No ETOH or drug history. Spontaneous miscarriages concerning for antiphospholipid antibody syndrome, but when this affects the heart, it generally leads to valvular problems and not cardiomyopathy => serologic workup negative for APLAS. No strong FH of CMP.  No significant coronary disease on cath. Cardiac MRI in 2/23 showed normal LV size with mild LV hypertrophy, EF of 31% with diffuse hypokinesis, normal RV size with mild systolic dysfunction and EF of 38%, and no definite LGE noted.  With medical therapy, echo in 3/23 showed EF up to 55-60%. EF down to 40-45% with hypertensive urgency 10/24. Echo in 12/24 showed that EF remained 40%.  She is not volume overloaded on exam, NYHA class I-II.  - Has Lasix  for prn use, rarely uses. - Continue Entresto  97/103 mg bid. BMET/BNP today. - Continue spironolactone  25 mg daily. Repeat BMET in 7-10days - Continue Coreg  25 mg bid.  - Continue Farxiga  10 mg daily. - Discussed avoidance of pregnancy with current meds - Repeat echo in 12/25 now that BP is controlled.  2.  HTN: Resistant. Renal artery dopplers did not look  like clinically significant RAS or fibromuscular dysplasia.  Recently, BP has been controlled. - Continue amlodipine  10 mg daily and the GDMT listed above.  3.  H/o multiple miscarriages/spontaneous abortions: ?Antiphospholipid antibody syndrome > serologic workup for APLAS negative. 4. 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  - She is supposed to see Dr. Shyrl to follow this, will refer back to him.  5. OSA: Uses CPAP. 6. HLD: On atorvastatin  80 mg daily.   Followup APP 4  months.   I spent 32 minutes reviewing records, interviewing/examining patient, and managing orders.   Ezra Shuck,  05/11/2024

## 2024-05-11 NOTE — Patient Instructions (Addendum)
 There has been no changes to your medications.  Labs done today, your results will be available in MyChart, we will contact you for abnormal readings.  Your physician has requested that you have an echocardiogram. Echocardiography is a painless test that uses sound waves to create images of your heart. It provides your doctor with information about the size and shape of your heart and how well your heart's chambers and valves are working. This procedure takes approximately one hour. There are no restrictions for this procedure. Please do NOT wear cologne, perfume, aftershave, or lotions (deodorant is allowed). Please arrive 15 minutes prior to your appointment time.  Please note: We ask at that you not bring children with you during ultrasound (echo/ vascular) testing. Due to room size and safety concerns, children are not allowed in the ultrasound rooms during exams. Our front office staff cannot provide observation of children in our lobby area while testing is being conducted. An adult accompanying a patient to their appointment will only be allowed in the ultrasound room at the discretion of the ultrasound technician under special circumstances. We apologize for any inconvenience.  PLEASE CALL DR. LANG OFFICE TO ARRANGE A FOLLOW UP APPOINTMENT AT [phone]  Your physician recommends that you schedule a follow-up appointment in: 4 months.  If you have any questions or concerns before your next appointment please send us  a message through [place] or call our office at [phone].    TO LEAVE A MESSAGE FOR THE NURSE SELECT OPTION 2, PLEASE LEAVE A MESSAGE INCLUDING: YOUR NAME DATE OF BIRTH CALL BACK NUMBER REASON FOR CALL**this is important as we prioritize the call backs  YOU WILL RECEIVE A CALL BACK THE SAME DAY AS LONG AS YOU CALL BEFORE 4:00 PM  At the [facility], you and your health needs are our priority. As part of our continuing mission to provide you  with exceptional heart care, we have created designated Provider Care Teams. These Care Teams include your primary Cardiologist (physician) and Advanced Practice Providers (APPs- Physician Assistants and Nurse Practitioners) who all work together to provide you with the care you need, when you need it.   You may see any of the following providers on your designated Care Team at your next follow up: Dr Toribio Fuel Dr Ezra Shuck Dr. Ria Commander Dr. Morene Brownie Amy Lenetta, NP Caffie Shed, [place] [facility], [place] Beckey Coe, NP [place] Lee, NP Ellouise Class, NP Tinnie Redman, PharmD Jaun Bash, PharmD   Please be sure to bring in all your medications bottles to every appointment.    Thank you for choosing [facility]

## 2024-06-08 ENCOUNTER — Ambulatory Visit

## 2024-07-05 ENCOUNTER — Ambulatory Visit: Payer: Self-pay | Admitting: Nurse Practitioner

## 2024-09-09 ENCOUNTER — Telehealth (HOSPITAL_COMMUNITY): Payer: Self-pay

## 2024-09-09 NOTE — Telephone Encounter (Signed)
 Called to confirm/remind patient of their appointment at the [facility] on 09/10/24.   Appointment:   [] Confirmed  [x] Left mess   [] No answer/No voice mail  [] VM Full/unable to leave message  [] Phone not in service  And to bring in all medications and/or complete list.

## 2024-09-10 ENCOUNTER — Ambulatory Visit (HOSPITAL_COMMUNITY): Payer: Self-pay | Admitting: Family Medicine

## 2024-09-10 ENCOUNTER — Encounter (HOSPITAL_COMMUNITY): Payer: Self-pay

## 2024-09-10 ENCOUNTER — Ambulatory Visit (HOSPITAL_COMMUNITY)
Admission: RE | Admit: 2024-09-10 | Discharge: 2024-09-10 | Disposition: A | Discharge status: Home / Self Care | Source: Ambulatory Visit | Attending: Family Medicine | Admitting: Family Medicine

## 2024-09-10 VITALS — BP 144/82 | HR 68 | Wt 174.6 lb

## 2024-09-10 DIAGNOSIS — I340 Nonrheumatic mitral (valve) insufficiency: Secondary | ICD-10-CM | POA: Diagnosis not present

## 2024-09-10 DIAGNOSIS — I5022 Chronic systolic (congestive) heart failure: Secondary | ICD-10-CM | POA: Diagnosis not present

## 2024-09-10 DIAGNOSIS — E785 Hyperlipidemia, unspecified: Secondary | ICD-10-CM | POA: Diagnosis not present

## 2024-09-10 DIAGNOSIS — R222 Localized swelling, mass and lump, trunk: Secondary | ICD-10-CM | POA: Insufficient documentation

## 2024-09-10 DIAGNOSIS — I428 Other cardiomyopathies: Secondary | ICD-10-CM | POA: Insufficient documentation

## 2024-09-10 DIAGNOSIS — G4733 Obstructive sleep apnea (adult) (pediatric): Secondary | ICD-10-CM | POA: Insufficient documentation

## 2024-09-10 DIAGNOSIS — I1A0 Resistant hypertension: Secondary | ICD-10-CM | POA: Insufficient documentation

## 2024-09-10 DIAGNOSIS — I110 Hypertensive heart disease with heart failure: Secondary | ICD-10-CM | POA: Insufficient documentation

## 2024-09-10 DIAGNOSIS — Z87898 Personal history of other specified conditions: Secondary | ICD-10-CM | POA: Diagnosis not present

## 2024-09-10 DIAGNOSIS — I10 Essential (primary) hypertension: Secondary | ICD-10-CM | POA: Diagnosis not present

## 2024-09-10 DIAGNOSIS — Z7984 Long term (current) use of oral hypoglycemic drugs: Secondary | ICD-10-CM | POA: Diagnosis not present

## 2024-09-10 DIAGNOSIS — Z79899 Other long term (current) drug therapy: Secondary | ICD-10-CM | POA: Insufficient documentation

## 2024-09-10 LAB — BASIC METABOLIC PANEL WITH GFR
Anion gap: 11 (ref 5–15)
BUN: 19 mg/dL (ref 6–20)
CO2: 24 mmol/L (ref 22–32)
Calcium: 8.9 mg/dL (ref 8.9–10.3)
Chloride: 102 mmol/L (ref 98–111)
Creatinine, Ser: 0.78 mg/dL (ref 0.44–1.00)
GFR, Estimated: >60 mL/min (ref >60)
Glucose, Bld: 93 mg/dL (ref 70–99)
Potassium: 3.9 mmol/L (ref 3.5–5.1)
Sodium: 137 mmol/L (ref 135–145)

## 2024-09-10 LAB — BRAIN NATRIURETIC PEPTIDE: B Natriuretic Peptide: 10.5 pg/mL (ref 0.0–100.0)

## 2024-09-10 NOTE — Patient Instructions (Addendum)
 Good to see you today!  You have been referred to pulmonary they will call to schedule an appointment  Labs done today, your results will be available in MyChart, we will contact you for abnormal readings.  Your physician recommends that you schedule a follow-up appointment in: may Call office in march to schedule an appointment  If you have any questions or concerns before your next appointment please send us  a message through [place] or call our office at [phone].    TO LEAVE A MESSAGE FOR THE NURSE SELECT OPTION 2, PLEASE LEAVE A MESSAGE INCLUDING: YOUR NAME DATE OF BIRTH CALL BACK NUMBER REASON FOR CALL**this is important as we prioritize the call backs  YOU WILL RECEIVE A CALL BACK THE SAME DAY AS LONG AS YOU CALL BEFORE 4:00 PM At the [facility], you and your health needs are our priority. As part of our continuing mission to provide you with exceptional heart care, we have created designated Provider Care Teams. These Care Teams include your primary Cardiologist (physician) and Advanced Practice Providers (APPs- Physician Assistants and Nurse Practitioners) who all work together to provide you with the care you need, when you need it.   You may see any of the following providers on your designated Care Team at your next follow up: Dr Toribio Fuel Dr Ezra Shuck Dr. Morene Brownie Greig Mosses, NP Caffie Shed, [place] [facility] [place], [place] Beckey Coe, NP Jordan Lee, NP Ellouise Class, NP Tinnie Redman, PharmD Jaun Bash, PharmD   Please be sure to bring in all your medications bottles to every appointment.    Thank you for choosing [facility]

## 2024-09-10 NOTE — Progress Notes (Signed)
 [facility]  Primary Care: Oley Bascom RAMAN, NP CT Surgery: Dr Shyrl  HF Cardiologist: Dr. Rolan  HPI: Alice Hays is a 43 y.o. female with history of HTN and multiple miscarriages (2 ectopic pregnancies + 2 spontaneous abortions), and diagnosis of mediastinal mass and systolic heart failure.    Admitted 2/23 with new systolic HF. She was markedly hypertensive with SBP 213/140. Incidentally noted pulmonary edema, small pleural effusions and nodular appearance anterior mediastinal soft tissue density (? Thymic hyperplasia). CTS consulted regarding mediastinal mass. Echo showed EF 45%, RV mildly reduced, moderate MR. She was diuresed with IV lasix  and AHF consulted. Underwent R/LHC showing no significant CAD (NICM), low filling pressures. CMRI showed normal LV size with mild LV hypertrophy, EF of 31% with diffuse hypokinesis, normal RV size with mild systolic dysfunction and EF of 38%, and no definite LGE noted.  She was started on GDMT.   Echo in 3/23 showed EF up to 55-60%, normal RV, normal IVC.   Admit 10/24 for bronchitis and HTN urgency. Was seen a week prior in ED for URI. RVP negative. She briefly required Bipap and nitroglycerin  gtt for elevated BP; trops 42>38, felt to be demand ischemia. Repeat echo showed  EF 40-45%, mild LVH, RV nl, mod MR. She was continued on Coreg , Entresto , Spiro, and Farxiga  at discharge. Discharged home, weight 80.8 kg.  Echo in 12/24 showed EF 40%, global hypokinesis, normal RV.   Today she returns for HF follow up. Overall feeling fine. She is not SOB with activity. Denies palpitations, abnormal bleeding, CP, dizziness, edema, or PND/Orthopnea. Appetite ok. Weight at home 172 pounds. Taking all medications. Husband passed away this year, she is working full time at [employer]. sBP at home <130s. Takes Lasix  rarely. Never received CPAP.  ECG (personally reviewed): none ordered today.   Labs (2/23): K 4.2, creatinine 1.02, LDL  184 Labs (3/23): K 4.3, creatinine 0.83 Labs (5/23): K 4.1, creatinine 0.74, LDL 146, HDL 55 Labs (11/24): K 4.0, creatinine 1.06 Labs (6/25): K 4.2, creatinine 0.73, LDL 124 Labs (7/25): K 4.3, creatinine 0.75, LDL 104  PMH: 1. Chronic systolic CHF: Nonischemic cardiomyopathy, ?due to uncontrolled HTN.  - Echo (2/23): EF 40-45%, RV mildly reduced, moderate MR. - R/LHC (2/23): no significant CAD, NICM, low filling pressures, preserved CO. RA mean 1, PA 22/4 (2), CO/CI 5.43/3.21 - cMRI (2/23): Difficult due to recent Feraheme , but normal LV size with mild LV hypertrophy, EF of 31% with diffuse hypokinesis, normal RV size with mild systolic dysfunction and EF of 38%, and no definite LGE noted.  - Echo (3/23): EF 55-60%, normal RV.  - Echo (10/24): EF 40-45%, mild LVH, normal RV, mod MR. - Echo (12/24): EF 40%, global hypokinesis, normal RV.  2. HTN 3. OSA 4. Fe deficiency anemia 5. 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 90 tablet 3   atorvastatin  (LIPITOR ) 80 MG tablet Take 1 tablet (80 mg total) by mouth daily. 90 tablet 3   carvedilol  (COREG ) 25 MG tablet Take 1 tablet (25 mg total) by mouth 2 (two) times daily with a meal. 180 tablet 3   dapagliflozin  propanediol (FARXIGA ) 10 MG TABS tablet Take 1 tablet (10 mg total) by mouth daily before breakfast. 90 tablet 3   ferrous sulfate  325 (65 FE) MG tablet Take 1 tablet (325 mg total) by mouth every other  day. 30 tablet 0   furosemide  (LASIX ) 20 MG tablet Take 1 tablet (20 mg total) by mouth as needed. For weight gain of 3 lbs in 24 hours or 5 lbs in a week 30 tablet 6   hydrOXYzine  (ATARAX ) 10 MG tablet Take 1 tablet (10 mg total) by mouth 3 (three) times daily as needed. 30 tablet 0   levalbuterol  (XOPENEX  HFA) 45 MCG/ACT inhaler Inhale 1-2 puffs into the lungs every 6 (six) hours as needed for  wheezing. 15 g 0   Multiple Vitamin (MV-ONE PO) Take 1 capsule by mouth every morning.     sacubitril -valsartan  (ENTRESTO ) 97-103 MG Take 1 tablet by mouth 2 (two) times daily. 180 tablet 3   spironolactone  (ALDACTONE ) 25 MG tablet Take 1 tablet (25 mg total) by mouth daily. 30 tablet 6   No current facility-administered medications for this encounter.   Allergies  Allergen Reactions   Shellfish Allergy Anaphylaxis   Social History   Socioeconomic History   Marital status: Married    Spouse name: Not on file   Number of children: Not on file   Years of education: Not on file   Highest education level: Not on file  Occupational History   Not on file  Tobacco Use   Smoking status: Never   Smokeless tobacco: Never  Vaping Use   Vaping status: Never Used  Substance and Sexual Activity   Alcohol use: No   Drug use: No   Sexual activity: Yes    Birth control/protection: None  Other Topics Concern   Not on file  Social History Narrative   Not on file   Social Drivers of Health   Financial Resource Strain: Not on file  Food Insecurity: No Food Insecurity (03/19/2024)   Hunger Vital Sign    Worried About Running Out of Food in the Last Year: Never true    Ran Out of Food in the Last Year: Never true  Transportation Needs: Unmet Transportation Needs (03/19/2024)   PRAPARE - Administrator, Civil Service (Medical): Yes    Lack of Transportation (Non-Medical): Yes  Physical Activity: Not on file  Stress: Not on file  Social Connections: Not on file  Intimate Partner Violence: Not on file   Family History  Problem Relation Age of Onset   Cancer Mother    Heart disease Mother    Heart disease Father    Hypertension Father    Stroke Father    Cancer Maternal Aunt    Diabetes Maternal Grandmother    ROS: All systems reviewed and negative except as per HPI.   Wt Readings from Last 3 Encounters:  09/10/24 79.2 kg (174 lb 9.6 oz)  05/11/24 79.7 kg (175 lb 9.6  oz)  04/02/24 82.1 kg (181 lb)   BP (!) 144/82   Pulse 68   Wt 79.2 kg (174 lb 9.6 oz)   SpO2 99%   BMI 36.49 kg/m   PHYSICAL EXAM: General:  NAD. No resp difficulty, walked into clinic HEENT: Normal Neck: Supple. No JVD. Cor: Regular rate & rhythm. No rubs, gallops or murmurs. Lungs: Clear Abdomen: Soft, obese, nontender, nondistended.  Extremities: No cyanosis, clubbing, rash, edema Neuro: Alert & oriented x 3, moves all 4 extremities w/o difficulty. Affect pleasant.  ASSESSMENT & PLAN: 1. Chronic Systolic CHF: Nonischemic cardiomyopathy.  Echo in 2/23 with EF 40-45% range with mild LV dilation and mild LVH, moderate MR, moderate LAE, mildly decreased RV systolic function. She has uncontrolled HTN  as a major risk factor. HIV and ANA negative and TSH normal. No ETOH or drug history. Spontaneous miscarriages concerning for antiphospholipid antibody syndrome, but when this affects the heart, it generally leads to valvular problems and not cardiomyopathy => serologic workup negative for APLAS. No strong FH of CMP.  No significant coronary disease on cath. Cardiac MRI in 2/23 showed normal LV size with mild LV hypertrophy, EF of 31% with diffuse hypokinesis, normal RV size with mild systolic dysfunction and EF of 38%, and no definite LGE noted.  With medical therapy, echo in 3/23 showed EF up to 55-60%. Echo 10/24 showed EF down to 40-45% with hypertensive urgency. Echo in 12/24 showed that EF remained 40%.  She is not volume overloaded on exam, NYHA class I-II.  - Has Lasix  for prn use, rarely uses. - Continue Entresto  97/103 mg bid. BMET/BNP today. - Continue spironolactone  25 mg daily.  - Continue Coreg  25 mg bid.  - Continue Farxiga  10 mg daily. - Discussed avoidance of pregnancy with current meds - Repeat echo in 12/25 now that BP is controlled.  2.  HTN: Resistant. Renal artery dopplers did not look like clinically significant RAS or fibromuscular dysplasia.  Recently, BP has been  controlled. - Continue amlodipine  10 mg daily and the GDMT listed above.  3.  H/o multiple miscarriages/spontaneous abortions: ?Antiphospholipid antibody syndrome > serologic workup for APLAS negative. 4. 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  - She is supposed to see Dr. Shyrl to follow this, defer to him.  5. OSA: sleep study 2023 showed AHI 23. - Never received CPAP. Refer to Pulmonary/sleep medicine. 6. HLD: On atorvastatin  80 mg daily.   Follow up in 6 months with Dr. Rolan.  Harlene HERO Omega, FNP-BC 09/10/2024

## 2024-09-28 ENCOUNTER — Ambulatory Visit

## 2024-09-28 ENCOUNTER — Ambulatory Visit (HOSPITAL_COMMUNITY)
Admission: RE | Admit: 2024-09-28 | Discharge: 2024-09-28 | Disposition: A | Source: Ambulatory Visit | Attending: Cardiology | Admitting: Cardiology

## 2024-09-28 VITALS — BP 126/80 | HR 77 | Ht 59.0 in | Wt 175.0 lb

## 2024-09-28 DIAGNOSIS — I5040 Unspecified combined systolic (congestive) and diastolic (congestive) heart failure: Secondary | ICD-10-CM | POA: Diagnosis not present

## 2024-09-28 DIAGNOSIS — R0981 Nasal congestion: Secondary | ICD-10-CM

## 2024-09-28 DIAGNOSIS — I110 Hypertensive heart disease with heart failure: Secondary | ICD-10-CM | POA: Diagnosis not present

## 2024-09-28 DIAGNOSIS — G4733 Obstructive sleep apnea (adult) (pediatric): Secondary | ICD-10-CM

## 2024-09-28 DIAGNOSIS — I5022 Chronic systolic (congestive) heart failure: Secondary | ICD-10-CM

## 2024-09-28 DIAGNOSIS — E66812 Obesity, class 2: Secondary | ICD-10-CM

## 2024-09-28 DIAGNOSIS — J301 Allergic rhinitis due to pollen: Secondary | ICD-10-CM

## 2024-09-28 DIAGNOSIS — I080 Rheumatic disorders of both mitral and aortic valves: Secondary | ICD-10-CM | POA: Diagnosis not present

## 2024-09-28 DIAGNOSIS — I429 Cardiomyopathy, unspecified: Secondary | ICD-10-CM | POA: Insufficient documentation

## 2024-09-28 DIAGNOSIS — Z6835 Body mass index (BMI) 35.0-35.9, adult: Secondary | ICD-10-CM

## 2024-09-28 LAB — ECHOCARDIOGRAM COMPLETE
Area-P 1/2: 3.21 cm2
Calc EF: 48.1 %
S' Lateral: 3.7 cm
Single Plane A2C EF: 51.2 %
Single Plane A4C EF: 48.2 %

## 2024-09-28 MED ORDER — SALINE SPRAY 0.65 % NA SOLN: 1.0000 | NASAL | 0 refills | Status: AC | PRN

## 2024-09-28 MED ORDER — FLUTICASONE PROPIONATE 50 MCG/ACT NA SUSP: 2.0000 | Freq: Every day | NASAL | 0 refills | Status: AC

## 2024-09-28 NOTE — Progress Notes (Signed)
 Pulmonology Office Visit   Subjective:  Patient ID: Alice Hays, female    DOB: 1981/08/27  MRN: [id number]  Referred by: Glena Harlene HERO, FNP  CC:  Chief Complaint  Patient presents with   Consult    New patient , pt has sleep study , having snoring,rouble breathing ,pt has used a c-pap in past , she don't own one.    HPI Alice Hays is a 43 y.o. female with Systolic CHF, hypertension, OSA  Respective notes from provider reviewed as appropriate to gather relevant information for patient care.   Discussed the use of AI scribe software for clinical note transcription with the patient, who gave verbal consent to proceed.  History of Present Illness   Alice Hays is a 43 year old female with moderate obstructive sleep apnea who presents for evaluation of sleep apnea.  She was diagnosed with moderate obstructive sleep apnea in 2023. She experiences snoring, episodes of apnea during sleep, waking up gasping or choking, and dry mouth in the morning. No recent morning headaches or significant daytime fatigue. Her sleep schedule involves going to bed between 10 and 11 PM and waking up between 6 and 6:30 AM, with a few awakenings during the night but the ability to return to sleep. No sleepwalking, sleeptalking, or hallucinations near bedtime.  She has a history of systolic congestive heart failure and is currently on Entresto , Lasix , spironolactone , and Coreg . She reports that her cardiologist, Dr. Rolan, told her that her heart failure is getting better. She is following a diet with light salt, more vegetables, and red meat, and is attempting to lose weight. Her weight has fluctuated between 170 and 185 pounds, and she exercises regularly.  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 She has stopped drinking coffee in the past year and denies alcohol and smoking use, although she tried  smoking when she was younger.  She reports nasal congestion, which she attributes to working with dusty towels, and uses over-the-counter Tylenol  for relief. She has not used nasal sprays but is open to trying them.       OSA history: Diagnosed March 2023>never used CPAP.   PRIOR TESTS and IMAGING: PSG/HSAT:  March 2023 NPSG: AHI 23, no significant central apnea, O2 nadir 78, desaturation 7.8-minute  Echo EF 45-50%, grade 1 diastolic dysfunction, normal PASP.  Moderate MR     09/28/2024    1:05 PM  Results of the [place] flowsheet  Sitting and reading 3  Watching TV 3  Sitting, inactive in a public place (e.g. a theatre or a meeting) 3  As a passenger in a car for an hour without a break 0  Lying down to rest in the afternoon when circumstances permit 2  Sitting and talking to someone 0  Sitting quietly after a lunch without alcohol 3  In a car, while stopped for a few minutes in traffic 0  Total score 14    Allergies: Shellfish allergy  Current Outpatient Medications:    amLODipine  (NORVASC ) 10 MG tablet, Take 1 tablet (10 mg total) by mouth daily., Disp: 90 tablet, Rfl: 3   atorvastatin  (LIPITOR ) 80 MG tablet, Take 1 tablet (80 mg total) by mouth daily., Disp: 90 tablet, Rfl: 3   carvedilol  (COREG ) 25 MG tablet, Take 1 tablet (25 mg total) by mouth 2 (two) times daily with a meal., Disp: 180 tablet, Rfl: 3  dapagliflozin  propanediol (FARXIGA ) 10 MG TABS tablet, Take 1 tablet (10 mg total) by mouth daily before breakfast., Disp: 90 tablet, Rfl: 3   ferrous sulfate  325 (65 FE) MG tablet, Take 1 tablet (325 mg total) by mouth every other day., Disp: 30 tablet, Rfl: 0   fluticasone (FLONASE) 50 MCG/ACT nasal spray, Place 2 sprays into both nostrils daily., Disp: 16 g, Rfl: 0   furosemide  (LASIX ) 20 MG tablet, Take 1 tablet (20 mg total) by mouth as needed. For weight gain of 3 lbs in 24 hours or 5 lbs in a week, Disp: 30 tablet, Rfl: 6   hydrOXYzine  (ATARAX ) 10 MG tablet, Take 1  tablet (10 mg total) by mouth 3 (three) times daily as needed., Disp: 30 tablet, Rfl: 0   levalbuterol  (XOPENEX  HFA) 45 MCG/ACT inhaler, Inhale 1-2 puffs into the lungs every 6 (six) hours as needed for wheezing., Disp: 15 g, Rfl: 0   Multiple Vitamin (MV-ONE PO), Take 1 capsule by mouth every morning., Disp: , Rfl:    sacubitril -valsartan  (ENTRESTO ) 97-103 MG, Take 1 tablet by mouth 2 (two) times daily., Disp: 180 tablet, Rfl: 3   sodium chloride  (OCEAN) 0.65 % SOLN nasal spray, Place 1 spray into both nostrils as needed for congestion., Disp: 60 mL, Rfl: 0   spironolactone  (ALDACTONE ) 25 MG tablet, Take 1 tablet (25 mg total) by mouth daily., Disp: 30 tablet, Rfl: 6 Past Medical History:  Diagnosis Date   Acute systolic CHF (congestive heart failure) (HCC)    Anemia    Ectopic pregnancy    L adnexa   Hx of varicella    Hypertension    Sleep apnea    Past Surgical History:  Procedure Laterality Date   ARM SURGERY     CESAREAN SECTION N/A 06/29/2016   Procedure: CESAREAN SECTION;  Surgeon: Dickie Carder, MD;  Location: [facility];  Service: Obstetrics;  Laterality: N/A;   RIGHT/LEFT HEART CATH AND CORONARY ANGIOGRAPHY N/A 11/30/2021   Procedure: RIGHT/LEFT HEART CATH AND CORONARY ANGIOGRAPHY;  Surgeon: Rolan Ezra RAMAN, MD;  Location: [facility] INVASIVE CV LAB;  Service: Cardiovascular;  Laterality: N/A;   Family History  Problem Relation Age of Onset   Cancer Mother    Heart disease Mother    Heart disease Father    Hypertension Father    Stroke Father    Cancer Maternal Aunt    Diabetes Maternal Grandmother    Social History   Socioeconomic History   Marital status: Married    Spouse name: Not on file   Number of children: Not on file   Years of education: Not on file   Highest education level: Not on file  Occupational History   Not on file  Tobacco Use   Smoking status: Never   Smokeless tobacco: Never  Vaping Use   Vaping status: Never Used  Substance and  Sexual Activity   Alcohol use: No   Drug use: No   Sexual activity: Yes    Birth control/protection: None  Other Topics Concern   Not on file  Social History Narrative   Not on file   Social Drivers of Health   Financial Resource Strain: Not on file  Food Insecurity: No Food Insecurity (03/19/2024)   Hunger Vital Sign    Worried About Running Out of Food in the Last Year: Never true    Ran Out of Food in the Last Year: Never true  Transportation Needs: Unmet Transportation Needs (03/19/2024)   PRAPARE - Transportation  Lack of Transportation (Medical): Yes    Lack of Transportation (Non-Medical): Yes  Physical Activity: Not on file  Stress: Not on file  Social Connections: Not on file  Intimate Partner Violence: Not on file       Objective:  BP 126/80   Pulse 77   Ht 4' 11 (1.499 m)   Wt 175 lb (79.4 kg)   SpO2 92%   BMI 35.35 kg/m  BMI Readings from Last 3 Encounters:  09/28/24 35.35 kg/m  09/10/24 36.49 kg/m  05/11/24 36.70 kg/m    Physical Exam: Physical Exam   ENT: Nasal mucosa inflamed. No hypertrophy of inferior turbinates. Tonsils are normal sized. Modified Mallampati score is 4. PULMONARY: Lungs clear to auscultation bilaterally, no adventitious breath sounds. CARDIOVASCULAR: Regular rate and rhythm, S1 S2 normal, no murmurs. ABDOMEN: Abdomen soft, nontender. Bowel sounds are normal. EXTREMITIES: No peripheral edema noted.       Diagnostic Review:  Last metabolic panel Lab Results  Component Value Date   GLUCOSE 93 09/10/2024   NA 137 09/10/2024   K 3.9 09/10/2024   CL 102 09/10/2024   CO2 24 09/10/2024   BUN 19 09/10/2024   CREATININE 0.78 09/10/2024   GFRNONAA >60 09/10/2024   CALCIUM  8.9 09/10/2024   PHOS 4.0 08/26/2023   PROT 6.1 (L) 08/24/2023   ALBUMIN 3.1 (L) 08/24/2023   LABGLOB 2.4 12/07/2021   AGRATIO 1.8 12/07/2021   BILITOT 0.7 08/24/2023   ALKPHOS 66 08/24/2023   AST 35 08/24/2023   ALT 36 08/24/2023   ANIONGAP 11  09/10/2024         Assessment & Plan:   Assessment & Plan OSA (obstructive sleep apnea)  Orders:   Home sleep test; Future  Obesity, class 2     Allergic rhinitis due to pollen, unspecified seasonality        Assessment and Plan    Obstructive sleep apnea evaluation and management Moderate obstructive sleep apnea diagnosed in 2023. CPAP therapy recommended. Alternative treatment options include a mandibular advancement device if CPAP is not tolerated. - Ordered home sleep study. - Initiate CPAP therapy if sleep study is positive. - Discussed alternative treatment options if CPAP is not tolerated.  Chronic systolic heart failure Managed with Entresto , Lasix , spironolactone , and Coreg . Heart failure reportedly improving. - Continue current heart failure medications.  Obesity, class 2 Weight loss may aid in improving sleep apnea symptoms. - Encouraged continued dietary modifications and exercise.  Nasal congestion Likely due to occupational exposure. Saline spray and Flonase recommended to clear nasal passages for CPAP use. - Prescribed saline nasal spray as needed. - Prescribed Flonase, two sprays in each nostril for the first seven days, then one spray in each nostril until congestion resolves.         Notes from NP [facility] reviewed as to gather relevant information for patient care and formulating plan.  He/She was counselled about not driving while drowsy which is common side effect of sleep related disorders.   Return for 1 month after CPAP setup.   I personally spent a total of 30 minutes in the care of the patient today including preparing to see the patient, getting/reviewing separately obtained history, performing a medically appropriate exam/evaluation, counseling and educating, placing orders, documenting clinical information in the EHR, independently interpreting results, and communicating results.   Rorie Delmore, MD

## 2024-09-28 NOTE — Patient Instructions (Signed)
  VISIT SUMMARY: Today, we discussed your moderate obstructive sleep apnea, chronic systolic heart failure, obesity, and nasal congestion. We reviewed your symptoms, current treatments, and lifestyle habits, and made some adjustments to your treatment plan to help manage your conditions more effectively.  YOUR PLAN: -OBSTRUCTIVE SLEEP APNEA: Obstructive sleep apnea is a condition where your airway becomes blocked during sleep, causing breathing pauses. We recommend starting CPAP therapy if your home sleep study confirms the diagnosis. If CPAP is not tolerated, a mandibular advancement device may be considered.  -CHRONIC SYSTOLIC HEART FAILURE: Chronic systolic heart failure is a condition where the heart doesn't pump blood as well as it should. Your current medications (Entresto , Lasix , spironolactone , and Coreg ) are helping, and you should continue taking them as prescribed.  -OBESITY, CLASS 2: Obesity can contribute to various health issues, including worsening sleep apnea. Continue with your dietary modifications and regular exercise to aid in weight loss.  -NASAL CONGESTION: Nasal congestion can make using CPAP for sleep apnea more difficult. We recommend using saline nasal spray as needed and Flonase , two sprays in each nostril for the first seven days, then one spray in each nostril until the congestion resolves.  INSTRUCTIONS: Please complete the home sleep study as ordered. If the study confirms sleep apnea, we will initiate CPAP therapy. Continue taking your heart failure medications as prescribed. Maintain your current diet and exercise routine to support weight loss. Use the saline nasal spray and Flonase  as directed to manage nasal congestion.                      Contains text generated by Abridge.                                 Contains text generated by Abridge.

## 2024-09-28 NOTE — Progress Notes (Signed)
  Echocardiogram 2D Echocardiogram has been performed.  Alice Hays 09/28/2024, 9:40 AM

## 2024-10-27 ENCOUNTER — Encounter

## 2024-10-27 DIAGNOSIS — G4733 Obstructive sleep apnea (adult) (pediatric): Secondary | ICD-10-CM

## 2024-11-02 ENCOUNTER — Ambulatory Visit

## 2024-11-02 NOTE — Progress Notes (Deleted)
 "  Pulmonology Office Visit   Subjective:  Patient ID: Alice Hays, female    DOB: Dec 25, 1980  MRN: [id number]  Referred by: Oley Bascom RAMAN, NP  CC:  No chief complaint on file.   HPI Alice Hays is a 44 y.o. female with Systolic CHF, hypertension, OSA.   Discussed the use of AI scribe software for clinical note transcription with the patient, who gave verbal consent to proceed.  History of Present Illness           OSA history: Diagnosed March 2023>never used CPAP.   PRIOR TESTS and IMAGING: PSG/HSAT:  March 2023 NPSG: AHI 23, no significant central apnea, O2 nadir 78, desaturation 7.8-minute  Echo EF 45-50%, grade 1 diastolic dysfunction, normal PASP.  Moderate MR     09/28/2024    1:05 PM  Results of the [facility] flowsheet  Sitting and reading 3  Watching TV 3  Sitting, inactive in a public place (e.g. a theatre or a meeting) 3  As a passenger in a car for an hour without a break 0  Lying down to rest in the afternoon when circumstances permit 2  Sitting and talking to someone 0  Sitting quietly after a lunch without alcohol 3  In a car, while stopped for a few minutes in traffic 0  Total score 14    Allergies: Shellfish allergy  Current Outpatient Medications:    amLODipine  (NORVASC ) 10 MG tablet, Take 1 tablet (10 mg total) by mouth daily., Disp: 90 tablet, Rfl: 3   atorvastatin  (LIPITOR ) 80 MG tablet, Take 1 tablet (80 mg total) by mouth daily., Disp: 90 tablet, Rfl: 3   carvedilol  (COREG ) 25 MG tablet, Take 1 tablet (25 mg total) by mouth 2 (two) times daily with a meal., Disp: 180 tablet, Rfl: 3   dapagliflozin  propanediol (FARXIGA ) 10 MG TABS tablet, Take 1 tablet (10 mg total) by mouth daily before breakfast., Disp: 90 tablet, Rfl: 3   ferrous sulfate  325 (65 FE) MG tablet, Take 1 tablet (325 mg total) by mouth every other day., Disp: 30 tablet, Rfl: 0   fluticasone  (FLONASE ) 50 MCG/ACT nasal spray, Place 2 sprays into both nostrils  daily., Disp: 16 g, Rfl: 0   furosemide  (LASIX ) 20 MG tablet, Take 1 tablet (20 mg total) by mouth as needed. For weight gain of 3 lbs in 24 hours or 5 lbs in a week, Disp: 30 tablet, Rfl: 6   hydrOXYzine  (ATARAX ) 10 MG tablet, Take 1 tablet (10 mg total) by mouth 3 (three) times daily as needed., Disp: 30 tablet, Rfl: 0   levalbuterol  (XOPENEX  HFA) 45 MCG/ACT inhaler, Inhale 1-2 puffs into the lungs every 6 (six) hours as needed for wheezing., Disp: 15 g, Rfl: 0   Multiple Vitamin (MV-ONE PO), Take 1 capsule by mouth every morning., Disp: , Rfl:    sacubitril -valsartan  (ENTRESTO ) 97-103 MG, Take 1 tablet by mouth 2 (two) times daily., Disp: 180 tablet, Rfl: 3   sodium chloride  (OCEAN) 0.65 % SOLN nasal spray, Place 1 spray into both nostrils as needed for congestion., Disp: 60 mL, Rfl: 0   spironolactone  (ALDACTONE ) 25 MG tablet, Take 1 tablet (25 mg total) by mouth daily., Disp: 30 tablet, Rfl: 6 Past Medical History:  Diagnosis Date   Acute systolic CHF (congestive heart failure) (HCC)    Anemia    Ectopic pregnancy    L adnexa   Hx of varicella    Hypertension    Sleep apnea  Past Surgical History:  Procedure Laterality Date   ARM SURGERY     CESAREAN SECTION N/A 06/29/2016   Procedure: CESAREAN SECTION;  Surgeon: Dickie Carder, MD;  Location: [facility];  Service: Obstetrics;  Laterality: N/A;   RIGHT/LEFT HEART CATH AND CORONARY ANGIOGRAPHY N/A 11/30/2021   Procedure: RIGHT/LEFT HEART CATH AND CORONARY ANGIOGRAPHY;  Surgeon: Rolan Ezra RAMAN, MD;  Location: [facility];  Service: Cardiovascular;  Laterality: N/A;   Family History  Problem Relation Age of Onset   Cancer Mother    Heart disease Mother    Heart disease Father    Hypertension Father    Stroke Father    Cancer Maternal Aunt    Diabetes Maternal Grandmother    Social History   Socioeconomic History   Marital status: Married    Spouse name: Not on file   Number of  children: Not on file   Years of education: Not on file   Highest education level: Not on file  Occupational History   Not on file  Tobacco Use   Smoking status: Never   Smokeless tobacco: Never  Vaping Use   Vaping status: Never Used  Substance and Sexual Activity   Alcohol use: No   Drug use: No   Sexual activity: Yes    Birth control/protection: None  Other Topics Concern   Not on file  Social History Narrative   Not on file   Social Drivers of Health   Tobacco Use: Low Risk (09/28/2024)   Patient History    Smoking Tobacco Use: Never    Smokeless Tobacco Use: Never    Passive Exposure: Not on file  Financial Resource Strain: Not on file  Food Insecurity: No Food Insecurity (03/19/2024)   Hunger Vital Sign    Worried About Running Out of Food in the Last Year: Never true    Ran Out of Food in the Last Year: Never true  Transportation Needs: Unmet Transportation Needs (03/19/2024)   PRAPARE - Administrator, Civil Service (Medical): Yes    Lack of Transportation (Non-Medical): Yes  Physical Activity: Not on file  Stress: Not on file  Social Connections: Not on file  Intimate Partner Violence: Not on file  Depression (PHQ2-9): Low Risk (04/02/2024)   Depression (PHQ2-9)    PHQ-2 Score: 1  Alcohol Screen: Not on file  Housing: Unknown (03/19/2024)   Housing Stability Vital Sign    Unable to Pay for Housing in the Last Year: No    Number of Times Moved in the Last Year: Not on file    Homeless in the Last Year: No  Utilities: Not on file  Health Literacy: Not on file       Objective:  There were no vitals taken for this visit. BMI Readings from Last 3 Encounters:  09/28/24 35.35 kg/m  09/10/24 36.49 kg/m  05/11/24 36.70 kg/m    Physical Exam: Physical Exam   ENT: Nasal mucosa inflamed. No hypertrophy of inferior turbinates. Tonsils are normal sized. Modified Mallampati score is 4. PULMONARY: Lungs clear to auscultation  bilaterally, no adventitious breath sounds. CARDIOVASCULAR: Regular rate and rhythm, S1 S2 normal, no murmurs. ABDOMEN: Abdomen soft, nontender. Bowel sounds are normal. EXTREMITIES: No peripheral edema noted.       Diagnostic Review:  Last metabolic panel Lab Results  Component Value Date   GLUCOSE 93 09/10/2024   NA 137 09/10/2024   K 3.9 09/10/2024   CL 102 09/10/2024   CO2 24  09/10/2024   BUN 19 09/10/2024   CREATININE 0.78 09/10/2024   GFRNONAA >60 09/10/2024   CALCIUM  8.9 09/10/2024   PHOS 4.0 08/26/2023   PROT 6.1 (L) 08/24/2023   ALBUMIN 3.1 (L) 08/24/2023   LABGLOB 2.4 12/07/2021   AGRATIO 1.8 12/07/2021   BILITOT 0.7 08/24/2023   ALKPHOS 66 08/24/2023   AST 35 08/24/2023   ALT 36 08/24/2023   ANIONGAP 11 09/10/2024         Assessment & Plan:   Assessment & Plan OSA (obstructive sleep apnea)        Assessment and Plan              Notes from NP [facility] reviewed as to gather relevant information for patient care and formulating plan.  He/She was counselled about not driving while drowsy which is common side effect of sleep related disorders.   No follow-ups on file.   I personally spent a total of 30 minutes in the care of the patient today including preparing to see the patient, getting/reviewing separately obtained history, performing a medically appropriate exam/evaluation, counseling and educating, placing orders, documenting clinical information in the EHR, independently interpreting results, and communicating results.   Sammi Fredericks, MD "

## 2024-11-06 ENCOUNTER — Other Ambulatory Visit (HOSPITAL_COMMUNITY): Payer: Self-pay | Admitting: Cardiology

## 2024-11-06 DIAGNOSIS — E7849 Other hyperlipidemia: Secondary | ICD-10-CM

## 2024-11-25 ENCOUNTER — Telehealth: Payer: Self-pay

## 2024-11-25 NOTE — Telephone Encounter (Signed)
 Date of Study: 10/27/2024  Interpretation: Moderate OSA with AHI of 15.8, O2 desaturation 14-minute. O2 nadir 70% .   Plan: Initiate auto CPAP at 8-15 cm H2O and provide supplies.  Side sleep.   Sammi Fredericks, MD.

## 2024-11-26 NOTE — Telephone Encounter (Signed)
 ATC x1. LVMTCB

## 2024-12-02 NOTE — Telephone Encounter (Signed)
 ATCx2 LVMTCB. Sending MyChart message per protocol as pt has been unable to be reached.

## 2024-12-07 ENCOUNTER — Ambulatory Visit: Admitting: Adult Health
